# Patient Record
Sex: Male | Born: 1965 | Race: Black or African American | Hispanic: No | Marital: Single | Smoking: Never smoker
Health system: Southern US, Community
[De-identification: ages and names within clinical notes are randomized; demographics above are authoritative.]

## PROBLEM LIST (undated history)

## (undated) DIAGNOSIS — M1990 Unspecified osteoarthritis, unspecified site: Secondary | ICD-10-CM

## (undated) DIAGNOSIS — C801 Malignant (primary) neoplasm, unspecified: Secondary | ICD-10-CM

## (undated) DIAGNOSIS — G4730 Sleep apnea, unspecified: Secondary | ICD-10-CM

## (undated) DIAGNOSIS — F419 Anxiety disorder, unspecified: Secondary | ICD-10-CM

## (undated) DIAGNOSIS — E785 Hyperlipidemia, unspecified: Secondary | ICD-10-CM

## (undated) DIAGNOSIS — E119 Type 2 diabetes mellitus without complications: Secondary | ICD-10-CM

## (undated) DIAGNOSIS — G629 Polyneuropathy, unspecified: Secondary | ICD-10-CM

## (undated) DIAGNOSIS — I10 Essential (primary) hypertension: Secondary | ICD-10-CM

## (undated) HISTORY — PX: PROSTATECTOMY: SHX69

## (undated) HISTORY — PX: APPENDECTOMY: SHX54

---

## 2015-01-10 ENCOUNTER — Emergency Department
Admission: EM | Admit: 2015-01-10 | Discharge: 2015-01-10 | Disposition: A | Discharge status: Home / Self Care | Payer: Medicaid Other | Attending: Emergency Medicine | Admitting: Emergency Medicine

## 2015-01-10 ENCOUNTER — Encounter: Payer: Self-pay | Admitting: Emergency Medicine

## 2015-01-10 DIAGNOSIS — H6981 Other specified disorders of Eustachian tube, right ear: Secondary | ICD-10-CM

## 2015-01-10 DIAGNOSIS — Z7952 Long term (current) use of systemic steroids: Secondary | ICD-10-CM | POA: Diagnosis not present

## 2015-01-10 DIAGNOSIS — H6991 Unspecified Eustachian tube disorder, right ear: Secondary | ICD-10-CM | POA: Insufficient documentation

## 2015-01-10 DIAGNOSIS — H9201 Otalgia, right ear: Secondary | ICD-10-CM | POA: Diagnosis present

## 2015-01-10 DIAGNOSIS — Z7951 Long term (current) use of inhaled steroids: Secondary | ICD-10-CM | POA: Diagnosis not present

## 2015-01-10 MED ORDER — FLUTICASONE PROPIONATE 50 MCG/ACT NA SUSP: 2.0000 | Freq: Every day | NASAL | Status: DC

## 2015-01-10 MED ORDER — PREDNISONE 10 MG PO TABS: ORAL_TABLET | ORAL | Status: DC

## 2015-01-10 NOTE — ED Provider Notes (Signed)
[facility] Emergency Department Provider Note  ____________________________________________  Time seen: 0569  I have reviewed the triage vital signs and the nursing notes.   HISTORY  Chief Complaint Otalgia   HPI Christopher [name] is a 49 y.o. male please of right ear pain for almost a week. He denies any fever. Has had a little cold  symptoms but no cough. He states that hewent to the drugstore and the pharmacist told him to get some ear drops. These have not helped. He rates his pain as 8 out of 10. Hearing is decreased in the right ear.  History reviewed. No pertinent past medical history.  There are no active problems to display for this patient.   No past surgical history on file.  Current Outpatient Rx  Name  Route  Sig  Dispense  Refill  . fluticasone (FLONASE) 50 MCG/ACT nasal spray   Each Nare   Place 2 sprays into both nostrils daily.   9.9 g   0   . predniSONE (DELTASONE) 10 MG tablet      Take 3 tablet once a day for 3 days   9 tablet   0     Allergies Review of patient's allergies indicates no known allergies.  No family history on file.  Social History History  Substance Use Topics  . Smoking status: Never Smoker   . Smokeless tobacco: Not on file  . Alcohol Use: No    Review of Systems Constitutional: No fever/chills Eyes: No visual changes. ENT: No sore throat. Congestion positive Cardiovascular: Denies chest pain. Respiratory: Denies shortness of breath. Gastrointestinal: No abdominal pain.  No nausea, no vomitingMusculoskeletal: Negative for back pain. Skin: Negative for rash. Neurological: Negative for headaches, focal weakness or numbness.  10-point ROS otherwise negative.  ____________________________________________   PHYSICAL EXAM:  VITAL SIGNS: ED Triage Vitals  Enc Vitals Group     BP 01/10/15 1321 132/87 mmHg     Pulse Rate 01/10/15 1321 56     Resp 01/10/15 1321 20     Temp 01/10/15 1321 98.4  F (36.9 C)     Temp Source 01/10/15 1321 Oral     SpO2 01/10/15 1321 100 %     Weight 01/10/15 1321 243 lb (110.224 kg)     Height 01/10/15 1321 5\' 9"  (1.753 m)     Head Cir --      Peak Flow --      Pain Score 01/10/15 1322 8     Pain Loc --      Pain Edu? --      Excl. in [place]? --     Constitutional: Alert and oriented. Well appearing and in no acute distress. Eyes: Conjunctivae are normal. PERRL. EOMI.  right TM is dull with some fluid, no erythema. Head: Atraumatic. Nose: No congestion/rhinnorhea. Mouth/Throat: Mucous membranes are moist.  Oropharynx non-erythematous. Neck: No stridor.   Hematological/Lymphatic/Immunilogical: No cervical lymphadenopathy. Cardiovascular: Normal rate, regular rhythm. Grossly normal heart sounds.  Good peripheral circulation. Respiratory: Normal respiratory effort.  No retractions. Lungs CTAB. Musculoskeletal: No lower extremity tenderness nor edema.  No joint effusions.  Speech is normal. No gait instability. Skin:  Skin is warm, dry and intact. No rash noted. Psychiatric: Mood and affect are normal. Speech and behavior are normal.  ____________________________________________   LABS (all labs ordered are listed, but only abnormal results are displayed)  Labs Reviewed - No data to display  PROCEDURES  Procedure(s) performed: None  Critical Care performed: No  ____________________________________________  INITIAL IMPRESSION / ASSESSMENT AND PLAN / ED COURSE  Pertinent labs & imaging results that were available during my care of the patient were reviewed by me and considered in my medical decision making (see chart for details).  Patient was given prescription for prednisone. He is instructed to use either over-the-counter decongestant or get the prescription for Flonase field. ____________________________________________   FINAL CLINICAL IMPRESSION(S) / ED DIAGNOSES  Final diagnoses:  Eustachian tube dysfunction, right       Johnn Hai, PA-C 01/10/15 1940  Lisa Roca, MD 01/12/15 [phone]

## 2015-03-13 ENCOUNTER — Encounter: Payer: Self-pay | Admitting: Emergency Medicine

## 2015-03-13 ENCOUNTER — Emergency Department
Admission: EM | Admit: 2015-03-13 | Discharge: 2015-03-13 | Disposition: A | Discharge status: Home / Self Care | Payer: Medicaid Other | Attending: Emergency Medicine | Admitting: Emergency Medicine

## 2015-03-13 DIAGNOSIS — I10 Essential (primary) hypertension: Secondary | ICD-10-CM | POA: Diagnosis not present

## 2015-03-13 DIAGNOSIS — E119 Type 2 diabetes mellitus without complications: Secondary | ICD-10-CM | POA: Diagnosis not present

## 2015-03-13 DIAGNOSIS — Z7951 Long term (current) use of inhaled steroids: Secondary | ICD-10-CM | POA: Diagnosis not present

## 2015-03-13 DIAGNOSIS — L03115 Cellulitis of right lower limb: Secondary | ICD-10-CM

## 2015-03-13 DIAGNOSIS — R2241 Localized swelling, mass and lump, right lower limb: Secondary | ICD-10-CM | POA: Diagnosis present

## 2015-03-13 DIAGNOSIS — Z7952 Long term (current) use of systemic steroids: Secondary | ICD-10-CM | POA: Insufficient documentation

## 2015-03-13 HISTORY — DX: Essential (primary) hypertension: I10

## 2015-03-13 HISTORY — DX: Unspecified osteoarthritis, unspecified site: M19.90

## 2015-03-13 HISTORY — DX: Type 2 diabetes mellitus without complications: E11.9

## 2015-03-13 MED ORDER — SULFAMETHOXAZOLE-TRIMETHOPRIM 800-160 MG PO TABS: 1.0000 | ORAL_TABLET | Freq: Two times a day (BID) | ORAL | Status: DC

## 2015-03-13 MED ORDER — TRAMADOL HCL 50 MG PO TABS
50.0000 mg | ORAL_TABLET | Freq: Once | ORAL | Status: AC
  Administered 2015-03-13: 50 mg via ORAL
  Filled 2015-03-13: qty 1

## 2015-03-13 MED ORDER — SULFAMETHOXAZOLE-TRIMETHOPRIM 800-160 MG PO TABS
1.0000 | ORAL_TABLET | Freq: Once | ORAL | Status: AC
  Administered 2015-03-13: 1 via ORAL
  Filled 2015-03-13: qty 1

## 2015-03-13 MED ORDER — TRAMADOL HCL 50 MG PO TABS: 50.0000 mg | ORAL_TABLET | Freq: Four times a day (QID) | ORAL | Status: DC | PRN

## 2015-03-13 NOTE — ED Notes (Signed)
AAOx3.  Skin warm and dry.  NAD.  D/C home 

## 2015-03-13 NOTE — ED Provider Notes (Signed)
[facility] Emergency Department Provider Note  ____________________________________________  Time seen: Approximately 4:01 PM  I have reviewed the triage vital signs and the nursing notes.   HISTORY  Chief Complaint Abscess    HPI Christopher [name] is a 49 y.o. male patient complain abscess of right upper anterior thigh. Patient stated this didn't injection site would normally places insulin shots. Patient stated this started 3 days ago. Patient denies any fever or chills stated this been no discharge from the area. Patient is rating his pain as a tight over 10 and describes it as sharp. Patient said he has rotated his injection site.   Past Medical History  Diagnosis Date  . Diabetes mellitus without complication   . Hypertension   . Arthritis     There are no active problems to display for this patient.   No past surgical history on file.  Current Outpatient Rx  Name  Route  Sig  Dispense  Refill  . fluticasone (FLONASE) 50 MCG/ACT nasal spray   Each Nare   Place 2 sprays into both nostrils daily.   9.9 g   0   . predniSONE (DELTASONE) 10 MG tablet      Take 3 tablet once a day for 3 days   9 tablet   0   . sulfamethoxazole-trimethoprim (BACTRIM DS,SEPTRA DS) 800-160 MG per tablet   Oral   Take 1 tablet by mouth 2 (two) times daily.   20 tablet   0   . traMADol (ULTRAM) 50 MG tablet   Oral   Take 1 tablet (50 mg total) by mouth every 6 (six) hours as needed for moderate pain.   12 tablet   0     Allergies Review of patient's allergies indicates no known allergies.  History reviewed. No pertinent family history.  Social History History  Substance Use Topics  . Smoking status: Never Smoker   . Smokeless tobacco: Not on file  . Alcohol Use: No    Review of Systems Constitutional: No fever/chills Eyes: No visual changes. ENT: No sore throat. Cardiovascular: Denies chest pain. Respiratory: Denies shortness of  breath. Gastrointestinal: No abdominal pain.  No nausea, no vomiting.  No diarrhea.  No constipation. Genitourinary: Negative for dysuria. Musculoskeletal: Negative for back pain. Skin: Negative for rash. Pain-free and swelling to the anterior right thigh. Neurological: Negative for headaches, focal weakness or numbness. Endocrine:Diabetes and hypertension. 10-point ROS otherwise negative.  ____________________________________________   PHYSICAL EXAM:  VITAL SIGNS: ED Triage Vitals  Enc Vitals Group     BP 03/13/15 1524 142/78 mmHg     Pulse Rate 03/13/15 1524 80     Resp 03/13/15 1524 18     Temp 03/13/15 1524 98.2 F (36.8 C)     Temp Source 03/13/15 1524 Oral     SpO2 03/13/15 1524 97 %     Weight 03/13/15 1524 247 lb (112.038 kg)     Height 03/13/15 1524 5\' 9"  (1.753 m)     Head Cir --      Peak Flow --      Pain Score 03/13/15 1525 9     Pain Loc --      Pain Edu? --      Excl. in [place]? --     Constitutional: Alert and oriented. Well appearing and in no acute distress. Eyes: Conjunctivae are normal. PERRL. EOMI. Head: Atraumatic. Nose: No congestion/rhinnorhea. Mouth/Throat: Mucous membranes are moist.  Oropharynx non-erythematous. Neck: No stridor.  No cervical  spine tenderness to palpation. Hematological/Lymphatic/Immunilogical: No cervical lymphadenopathy. Cardiovascular: Normal rate, regular rhythm. Grossly normal heart sounds.  Good peripheral circulation. Respiratory: Normal respiratory effort.  No retractions. Lungs CTAB. Gastrointestinal: Soft and nontender. No distention. No abdominal bruits. No CVA tenderness. Musculoskeletal: No lower extremity tenderness nor edema.  No joint effusions. Neurologic:  Normal speech and language. No gross focal neurologic deficits are appreciated. No gait instability. Skin:  Skin is warm, dry and intact. No rash noted. Papular lesions on erythematous base. Lesion is nonfluctuant. Mild guarding with palpation. Psychiatric:  Mood and affect are normal. Speech and behavior are normal.  ____________________________________________   LABS (all labs ordered are listed, but only abnormal results are displayed)  Labs Reviewed - No data to display ____________________________________________  EKG   ____________________________________________  RADIOLOGY   ____________________________________________   PROCEDURES  Procedure(s) performed: None  Critical Care performed: No  ____________________________________________   INITIAL IMPRESSION / ASSESSMENT AND PLAN / ED COURSE  Pertinent labs & imaging results that were available during my care of the patient were reviewed by me and considered in my medical decision making (see chart for details).  Injection site skin infection. Area of erythema was outlined with a skin marker. Patient advised return erythema exceeds the boundaries of the skin marker. Patient placed on Bactrim DS and tramadol. Patient advised follow-up with his family doctor return by ER physician condition worsens. ____________________________________________   FINAL CLINICAL IMPRESSION(S) / ED DIAGNOSES  Final diagnoses:  Cellulitis of right lower extremity      Sable Feil, PA-C 03/13/15 1616  Earleen Newport, MD 03/13/15 [phone]

## 2015-03-13 NOTE — ED Notes (Signed)
Pt here for abscess on right upper thigh. Pt reports that it where he injects his insulin.

## 2015-06-05 ENCOUNTER — Encounter: Payer: Self-pay | Admitting: Emergency Medicine

## 2015-06-05 ENCOUNTER — Emergency Department
Admission: EM | Admit: 2015-06-05 | Discharge: 2015-06-05 | Disposition: A | Discharge status: Home / Self Care | Payer: Medicaid Other | Attending: Emergency Medicine | Admitting: Emergency Medicine

## 2015-06-05 DIAGNOSIS — R252 Cramp and spasm: Secondary | ICD-10-CM

## 2015-06-05 DIAGNOSIS — E1165 Type 2 diabetes mellitus with hyperglycemia: Secondary | ICD-10-CM | POA: Insufficient documentation

## 2015-06-05 DIAGNOSIS — M62838 Other muscle spasm: Secondary | ICD-10-CM | POA: Insufficient documentation

## 2015-06-05 DIAGNOSIS — Z7951 Long term (current) use of inhaled steroids: Secondary | ICD-10-CM | POA: Insufficient documentation

## 2015-06-05 DIAGNOSIS — Z7952 Long term (current) use of systemic steroids: Secondary | ICD-10-CM | POA: Insufficient documentation

## 2015-06-05 DIAGNOSIS — Z792 Long term (current) use of antibiotics: Secondary | ICD-10-CM | POA: Diagnosis not present

## 2015-06-05 DIAGNOSIS — I10 Essential (primary) hypertension: Secondary | ICD-10-CM | POA: Diagnosis not present

## 2015-06-05 DIAGNOSIS — R739 Hyperglycemia, unspecified: Secondary | ICD-10-CM

## 2015-06-05 HISTORY — DX: Hyperlipidemia, unspecified: E78.5

## 2015-06-05 HISTORY — DX: Anxiety disorder, unspecified: F41.9

## 2015-06-05 HISTORY — DX: Polyneuropathy, unspecified: G62.9

## 2015-06-05 LAB — COMPREHENSIVE METABOLIC PANEL
ALT: 32 U/L (ref 17–63)
AST: 32 U/L (ref 15–41)
Albumin: 4.3 g/dL (ref 3.5–5.0)
Alkaline Phosphatase: 68 U/L (ref 38–126)
Anion gap: 12 (ref 5–15)
BILIRUBIN TOTAL: 0.2 mg/dL — AB (ref 0.3–1.2)
BUN: 12 mg/dL (ref 6–20)
CALCIUM: 9.8 mg/dL (ref 8.9–10.3)
CHLORIDE: 100 mmol/L — AB (ref 101–111)
CO2: 21 mmol/L — ABNORMAL LOW (ref 22–32)
CREATININE: 1.13 mg/dL (ref 0.61–1.24)
GFR calc non Af Amer: >60 mL/min (ref >60)
Glucose, Bld: 296 mg/dL — ABNORMAL HIGH (ref 65–99)
Potassium: 4 mmol/L (ref 3.5–5.1)
Sodium: 133 mmol/L — ABNORMAL LOW (ref 135–145)
TOTAL PROTEIN: 7.8 g/dL (ref 6.5–8.1)

## 2015-06-05 LAB — CBC
HCT: 45.4 % (ref 40.0–52.0)
HEMOGLOBIN: 14.6 g/dL (ref 13.0–18.0)
MCH: 26.6 pg (ref 26.0–34.0)
MCHC: 32.1 g/dL (ref 32.0–36.0)
MCV: 83 fL (ref 80.0–100.0)
PLATELETS: 312 10*3/uL (ref 150–440)
RBC: 5.46 MIL/uL (ref 4.40–5.90)
RDW: 13.8 % (ref 11.5–14.5)
WBC: 12.7 10*3/uL — ABNORMAL HIGH (ref 3.8–10.6)

## 2015-06-05 LAB — MAGNESIUM: MAGNESIUM: 1.7 mg/dL (ref 1.7–2.4)

## 2015-06-05 MED ORDER — INSULIN ASPART 100 UNIT/ML ~~LOC~~ SOLN
10.0000 [IU] | Freq: Once | SUBCUTANEOUS | Status: AC
  Administered 2015-06-05: 10 [IU] via SUBCUTANEOUS
  Filled 2015-06-05: qty 10

## 2015-06-05 MED ORDER — DIAZEPAM 5 MG/ML IJ SOLN
2.5000 mg | Freq: Once | INTRAMUSCULAR | Status: AC
  Administered 2015-06-05: 2.5 mg via INTRAVENOUS
  Filled 2015-06-05: qty 2

## 2015-06-05 MED ORDER — SODIUM CHLORIDE 0.9 % IV BOLUS (SEPSIS)
1000.0000 mL | Freq: Once | INTRAVENOUS | Status: AC
  Administered 2015-06-05: 1000 mL via INTRAVENOUS

## 2015-06-05 MED ORDER — POTASSIUM CHLORIDE CRYS ER 20 MEQ PO TBCR
20.0000 meq | EXTENDED_RELEASE_TABLET | Freq: Once | ORAL | Status: AC
  Administered 2015-06-05: 20 meq via ORAL
  Filled 2015-06-05: qty 1

## 2015-06-05 MED ORDER — MAGNESIUM SULFATE 2 GM/50ML IV SOLN
2.0000 g | Freq: Once | INTRAVENOUS | Status: AC
  Administered 2015-06-05: 2 g via INTRAVENOUS
  Filled 2015-06-05: qty 50

## 2015-06-05 MED ORDER — DIAZEPAM 5 MG PO TABS
5.0000 mg | ORAL_TABLET | Freq: Once | ORAL | Status: AC
  Administered 2015-06-05: 5 mg via ORAL
  Filled 2015-06-05: qty 1

## 2015-06-05 MED ORDER — DIAZEPAM 2 MG PO TABS: 2.0000 mg | ORAL_TABLET | Freq: Three times a day (TID) | ORAL | Status: AC | PRN

## 2015-06-05 NOTE — ED Notes (Signed)
Pt comes into the ED via EMS c/o cramps in his hands bilaterally.  Patient had stable VS @ 135/95, 105HR, 22RR, 98% room air, and 291 CBG. Patient has history of anxiety and takes medication 2x a day where as it is prescribed Q8H.  Patient present with some anxiousness.

## 2015-06-05 NOTE — ED Provider Notes (Signed)
[facility] Emergency Department Provider Note  ____________________________________________  Time seen: 1925  I have reviewed the triage vital signs and the nursing notes.   HISTORY  Chief Complaint Hand Pain     HPI Christopher [name] is a 49 y.o. male who presents to the emergency department with spasms in both hands. He reports that he has had some muscle spasms in the past, but they have always passed fairly easily. This particular set a spasm started this afternoon and had been fairly continuous. In addition to spasms in his hand, he is feeling twinges of spasms in his feet and he reports, when he twisted in bed to reach the call bell, he felt a spasm in his right abdominal wall which has continued.   The patient is diabetic. He reports his sugars run between 160 and 250 during the day. He is on Lantus as well as a sliding scale insulin.   The patient denies any chest pain or shortness of breath.   Past Medical History  Diagnosis Date  . Diabetes mellitus without complication ([place])   . Hypertension   . Arthritis   . Anxiety   . Hyperlipidemia   . Neuropathy ([place])     There are no active problems to display for this patient.   History reviewed. No pertinent past surgical history.  Current Outpatient Rx  Name  Route  Sig  Dispense  Refill  . diazepam (VALIUM) 2 MG tablet   Oral   Take 1 tablet (2 mg total) by mouth every 8 (eight) hours as needed for anxiety.   15 tablet   0   . fluticasone (FLONASE) 50 MCG/ACT nasal spray   Each Nare   Place 2 sprays into both nostrils daily.   9.9 g   0   . predniSONE (DELTASONE) 10 MG tablet      Take 3 tablet once a day for 3 days   9 tablet   0   . sulfamethoxazole-trimethoprim (BACTRIM DS,SEPTRA DS) 800-160 MG per tablet   Oral   Take 1 tablet by mouth 2 (two) times daily.   20 tablet   0   . traMADol (ULTRAM) 50 MG tablet   Oral   Take 1 tablet (50 mg total) by mouth every 6 (six) hours  as needed for moderate pain.   12 tablet   0     Allergies Review of patient's allergies indicates no known allergies.  No family history on file.  Social History Social History  Substance Use Topics  . Smoking status: Never Smoker   . Smokeless tobacco: None  . Alcohol Use: No    Review of Systems  Constitutional: Negative for fever. ENT: Negative for sore throat. Cardiovascular: Negative for chest pain. Respiratory: Negative for cough. Gastrointestinal: Negative for abdominal pain, vomiting and diarrhea. Genitourinary: Negative for dysuria. Musculoskeletal: Muscle spasm and hands and abdominal and chest wall. See history of present illness. Skin: Negative for rash. Neurological: Negative for paresthesia or weakness   10-point ROS otherwise negative.  ____________________________________________   PHYSICAL EXAM:  VITAL SIGNS: ED Triage Vitals  Enc Vitals Group     BP 06/05/15 1831 153/97 mmHg     Pulse Rate 06/05/15 1831 95     Resp 06/05/15 1831 18     Temp 06/05/15 1831 98.5 F (36.9 C)     Temp Source 06/05/15 1831 Oral     SpO2 06/05/15 1831 99 %     Weight 06/05/15 1831 250 lb (  113.399 kg)     Height 06/05/15 1831 5\' 9"  (1.753 m)     Head Cir --      Peak Flow --      Pain Score 06/05/15 1832 8     Pain Loc --      Pain Edu? --      Excl. in [place]? --     Constitutional:  Alert and oriented. Appears uncomfortable. ENT   Head: Normocephalic and atraumatic.   Nose: No congestion/rhinnorhea.    Cardiovascular: Normal rate, regular rhythm, no murmur noted Respiratory:  Normal respiratory effort, no tachypnea.    Breath sounds are clear and equal bilaterally.  Gastrointestinal: Soft and nontender. No distention.  Back: No muscle spasm, no tenderness, no CVA tenderness. Musculoskeletal: No deformity noted. Patient appears to have carpal spasms. He also reports having a spasm in his abdominal wall which is not appreciable. Neurologic:  Normal  speech and language. No gross focal neurologic deficits are appreciated.  Skin:  Skin is warm, dry. No rash noted. Psychiatric: Mood and affect are normal. Speech and behavior are normal.  ____________________________________________    LABS (pertinent positives/negatives)  Labs Reviewed  CBC - Abnormal; Notable for the following:    WBC 12.7 (*)    All other components within normal limits  COMPREHENSIVE METABOLIC PANEL - Abnormal; Notable for the following:    Sodium 133 (*)    Chloride 100 (*)    CO2 21 (*)    Glucose, Bld 296 (*)    Total Bilirubin 0.2 (*)    All other components within normal limits  MAGNESIUM   ____________________________________________   PROCEDURES    ____________________________________________   INITIAL IMPRESSION / ASSESSMENT AND PLAN / ED COURSE  Pertinent labs & imaging results that were available during my care of the patient were reviewed by me and considered in my medical decision making (see chart for details).  Patient with carpal spasm and other muscular spasms. He reports he has a twinge of spasms in his feet as well. We will treat him with Valium, 2.5 mg IV. I'll also treat him with magnesium, 2 g IV. A magnesium level is pending, as is basic labs including a basic metabolic panel and a CBC.   We will also treat him with 1 L of normal saline.   ----------------------------------------- 8:33 PM on 06/05/2015 -----------------------------------------  The spasms are improving. Patient reports she has ongoing soreness and discomfort.  I will add potassium by mouth and a dose of Valium by mouth. His glucose is 296. Give him a dose of regular insulin. He is due for his long-acting Lantus this evening. I'll have him get that to himself when he gets home tonight.  ----------------------------------------- 9:23 PM on 06/05/2015 -----------------------------------------  Patient reports he feels better at this time. He has approximate  400 mL's of the 1 L of normal saline infused at this time. We'll let this bag of fluid finished going in and then let him go home. I've advised him to continue with his diabetic regimen and follow with primary physician.  ____________________________________________   FINAL CLINICAL IMPRESSION(S) / ED DIAGNOSES  Final diagnoses:  Spasms of the hands or feet  Hyperglycemia      Ahmed Prima, MD 06/05/15 2127

## 2015-06-05 NOTE — Discharge Instructions (Signed)
Continue your current treatment for diabetes. Drink plenty of water. Watch your diet. May take low-dose Valium if you have further spasm. You may also take magnesium, 400-800 mg, as needed or twice a day.  Follow-up with your regular doctor. Return to the emergency department if you have uncontrolled symptoms or other urgent concerns.

## 2015-07-04 ENCOUNTER — Observation Stay
Admission: EM | Admit: 2015-07-04 | Discharge: 2015-07-06 | Disposition: A | Discharge status: Home / Self Care | Payer: Medicaid Other | Attending: Internal Medicine | Admitting: Internal Medicine

## 2015-07-04 ENCOUNTER — Encounter: Payer: Self-pay | Admitting: Emergency Medicine

## 2015-07-04 DIAGNOSIS — E785 Hyperlipidemia, unspecified: Secondary | ICD-10-CM | POA: Insufficient documentation

## 2015-07-04 DIAGNOSIS — F419 Anxiety disorder, unspecified: Secondary | ICD-10-CM | POA: Diagnosis not present

## 2015-07-04 DIAGNOSIS — E1140 Type 2 diabetes mellitus with diabetic neuropathy, unspecified: Secondary | ICD-10-CM | POA: Diagnosis not present

## 2015-07-04 DIAGNOSIS — R739 Hyperglycemia, unspecified: Secondary | ICD-10-CM

## 2015-07-04 DIAGNOSIS — R252 Cramp and spasm: Secondary | ICD-10-CM | POA: Diagnosis present

## 2015-07-04 DIAGNOSIS — M1991 Primary osteoarthritis, unspecified site: Secondary | ICD-10-CM | POA: Insufficient documentation

## 2015-07-04 DIAGNOSIS — E1165 Type 2 diabetes mellitus with hyperglycemia: Secondary | ICD-10-CM | POA: Diagnosis not present

## 2015-07-04 DIAGNOSIS — M62838 Other muscle spasm: Secondary | ICD-10-CM | POA: Diagnosis present

## 2015-07-04 DIAGNOSIS — Z79891 Long term (current) use of opiate analgesic: Secondary | ICD-10-CM | POA: Diagnosis not present

## 2015-07-04 DIAGNOSIS — G894 Chronic pain syndrome: Secondary | ICD-10-CM | POA: Insufficient documentation

## 2015-07-04 DIAGNOSIS — I10 Essential (primary) hypertension: Secondary | ICD-10-CM | POA: Insufficient documentation

## 2015-07-04 DIAGNOSIS — Z7952 Long term (current) use of systemic steroids: Secondary | ICD-10-CM | POA: Insufficient documentation

## 2015-07-04 DIAGNOSIS — Z794 Long term (current) use of insulin: Secondary | ICD-10-CM | POA: Insufficient documentation

## 2015-07-04 LAB — BASIC METABOLIC PANEL
ANION GAP: 7 (ref 5–15)
BUN: 12 mg/dL (ref 6–20)
CALCIUM: 8.7 mg/dL — AB (ref 8.9–10.3)
CHLORIDE: 105 mmol/L (ref 101–111)
CO2: 21 mmol/L — AB (ref 22–32)
Creatinine, Ser: 1.03 mg/dL (ref 0.61–1.24)
GFR calc non Af Amer: >60 mL/min (ref >60)
Glucose, Bld: 527 mg/dL (ref 65–99)
Potassium: 3.7 mmol/L (ref 3.5–5.1)
SODIUM: 133 mmol/L — AB (ref 135–145)

## 2015-07-04 LAB — CBC WITH DIFFERENTIAL/PLATELET
Basophils Absolute: 0.1 10*3/uL (ref 0–0.1)
Basophils Relative: 1 %
Eosinophils Absolute: 0.1 10*3/uL (ref 0–0.7)
Eosinophils Relative: 1 %
HEMATOCRIT: 42.4 % (ref 40.0–52.0)
HEMOGLOBIN: 13.7 g/dL (ref 13.0–18.0)
Lymphocytes Relative: 21 %
Lymphs Abs: 1.9 10*3/uL (ref 1.0–3.6)
MCH: 26.7 pg (ref 26.0–34.0)
MCHC: 32.2 g/dL (ref 32.0–36.0)
MCV: 82.9 fL (ref 80.0–100.0)
Monocytes Absolute: 0.5 10*3/uL (ref 0.2–1.0)
Monocytes Relative: 6 %
NEUTROS ABS: 6.4 10*3/uL (ref 1.4–6.5)
NEUTROS PCT: 71 %
Platelets: 256 10*3/uL (ref 150–440)
RBC: 5.11 MIL/uL (ref 4.40–5.90)
RDW: 14.1 % (ref 11.5–14.5)
WBC: 9 10*3/uL (ref 3.8–10.6)

## 2015-07-04 LAB — GLUCOSE, CAPILLARY: Glucose-Capillary: 480 mg/dL — ABNORMAL HIGH (ref 65–99)

## 2015-07-04 MED ORDER — HYDROCHLOROTHIAZIDE 25 MG PO TABS
25.0000 mg | ORAL_TABLET | Freq: Every day | ORAL | Status: DC
  Administered 2015-07-05 – 2015-07-06 (×2): 25 mg via ORAL
  Filled 2015-07-04 (×2): qty 1

## 2015-07-04 MED ORDER — POLYETHYLENE GLYCOL 3350 17 G PO PACK: 17.0000 g | PACK | Freq: Every day | ORAL | Status: DC | PRN

## 2015-07-04 MED ORDER — INSULIN GLARGINE 100 UNIT/ML ~~LOC~~ SOLN
68.0000 [IU] | Freq: Once | SUBCUTANEOUS | Status: AC
Start: 2015-07-04 — End: 2015-07-05
  Administered 2015-07-05: 68 [IU] via SUBCUTANEOUS
  Filled 2015-07-04: qty 0.68

## 2015-07-04 MED ORDER — ACETAMINOPHEN 325 MG PO TABS
650.0000 mg | ORAL_TABLET | Freq: Four times a day (QID) | ORAL | Status: DC | PRN
Start: 2015-07-04 — End: 2015-07-06

## 2015-07-04 MED ORDER — MAGNESIUM SULFATE 2 GM/50ML IV SOLN
2.0000 g | Freq: Once | INTRAVENOUS | Status: AC
  Administered 2015-07-04: 2 g via INTRAVENOUS
  Filled 2015-07-04: qty 50

## 2015-07-04 MED ORDER — ONDANSETRON HCL 4 MG/2ML IJ SOLN: 4.0000 mg | Freq: Four times a day (QID) | INTRAMUSCULAR | Status: DC | PRN

## 2015-07-04 MED ORDER — POTASSIUM CHLORIDE IN NACL 20-0.9 MEQ/L-% IV SOLN
INTRAVENOUS | Status: DC
  Administered 2015-07-05 – 2015-07-06 (×4): via INTRAVENOUS
  Filled 2015-07-04 (×7): qty 1000

## 2015-07-04 MED ORDER — AMLODIPINE BESYLATE 5 MG PO TABS
5.0000 mg | ORAL_TABLET | Freq: Every day | ORAL | Status: DC
  Administered 2015-07-05 – 2015-07-06 (×2): 5 mg via ORAL
  Filled 2015-07-04 (×2): qty 1

## 2015-07-04 MED ORDER — DIAZEPAM 5 MG/ML IJ SOLN
5.0000 mg | Freq: Once | INTRAMUSCULAR | Status: AC
  Administered 2015-07-05: 5 mg via INTRAVENOUS
  Filled 2015-07-04: qty 2

## 2015-07-04 MED ORDER — LORAZEPAM 0.5 MG PO TABS
0.5000 mg | ORAL_TABLET | Freq: Two times a day (BID) | ORAL | Status: DC
  Administered 2015-07-05 – 2015-07-06 (×3): 0.5 mg via ORAL
  Filled 2015-07-04 (×3): qty 1

## 2015-07-04 MED ORDER — PREGABALIN 50 MG PO CAPS
100.0000 mg | ORAL_CAPSULE | Freq: Two times a day (BID) | ORAL | Status: DC
  Administered 2015-07-05 – 2015-07-06 (×4): 100 mg via ORAL
  Filled 2015-07-04 (×4): qty 2

## 2015-07-04 MED ORDER — LISINOPRIL 10 MG PO TABS
10.0000 mg | ORAL_TABLET | Freq: Every day | ORAL | Status: DC
  Administered 2015-07-05 – 2015-07-06 (×2): 10 mg via ORAL
  Filled 2015-07-04 (×2): qty 1

## 2015-07-04 MED ORDER — INSULIN ASPART 100 UNIT/ML ~~LOC~~ SOLN
0.0000 [IU] | Freq: Three times a day (TID) | SUBCUTANEOUS | Status: DC
  Administered 2015-07-05 (×2): 7 [IU] via SUBCUTANEOUS
  Administered 2015-07-05: 11 [IU] via SUBCUTANEOUS
  Administered 2015-07-06: 3 [IU] via SUBCUTANEOUS
  Administered 2015-07-06: 7 [IU] via SUBCUTANEOUS
  Administered 2015-07-06: 4 [IU] via SUBCUTANEOUS
  Filled 2015-07-04: qty 4
  Filled 2015-07-04: qty 7
  Filled 2015-07-04: qty 3
  Filled 2015-07-04: qty 7
  Filled 2015-07-04: qty 3
  Filled 2015-07-04: qty 7
  Filled 2015-07-04: qty 11

## 2015-07-04 MED ORDER — OXYCODONE-ACETAMINOPHEN 5-325 MG PO TABS
1.0000 | ORAL_TABLET | Freq: Four times a day (QID) | ORAL | Status: DC | PRN
Start: 2015-07-04 — End: 2015-07-06
  Administered 2015-07-05 – 2015-07-06 (×6): 1 via ORAL
  Filled 2015-07-04 (×7): qty 1

## 2015-07-04 MED ORDER — ALBUTEROL SULFATE (2.5 MG/3ML) 0.083% IN NEBU: 2.5000 mg | INHALATION_SOLUTION | RESPIRATORY_TRACT | Status: DC | PRN

## 2015-07-04 MED ORDER — SODIUM CHLORIDE 0.9 % IV BOLUS (SEPSIS)
1000.0000 mL | Freq: Once | INTRAVENOUS | Status: AC
  Administered 2015-07-04: 1000 mL via INTRAVENOUS

## 2015-07-04 MED ORDER — INSULIN GLARGINE 100 UNIT/ML ~~LOC~~ SOLN
72.0000 [IU] | Freq: Every day | SUBCUTANEOUS | Status: DC
  Administered 2015-07-05: 72 [IU] via SUBCUTANEOUS
  Filled 2015-07-04 (×2): qty 0.72

## 2015-07-04 MED ORDER — ACETAMINOPHEN 650 MG RE SUPP: 650.0000 mg | Freq: Four times a day (QID) | RECTAL | Status: DC | PRN

## 2015-07-04 MED ORDER — ENOXAPARIN SODIUM 40 MG/0.4ML ~~LOC~~ SOLN
40.0000 mg | SUBCUTANEOUS | Status: DC
  Administered 2015-07-05: 40 mg via SUBCUTANEOUS
  Filled 2015-07-04: qty 0.4

## 2015-07-04 MED ORDER — INSULIN ASPART 100 UNIT/ML ~~LOC~~ SOLN
0.0000 [IU] | Freq: Every day | SUBCUTANEOUS | Status: DC
  Administered 2015-07-05: 2 [IU] via SUBCUTANEOUS
  Administered 2015-07-05: 3 [IU] via SUBCUTANEOUS
  Filled 2015-07-04: qty 2

## 2015-07-04 MED ORDER — DIAZEPAM 5 MG PO TABS
5.0000 mg | ORAL_TABLET | Freq: Once | ORAL | Status: AC
  Administered 2015-07-04: 5 mg via ORAL
  Filled 2015-07-04: qty 1

## 2015-07-04 MED ORDER — ONDANSETRON HCL 4 MG PO TABS: 4.0000 mg | ORAL_TABLET | Freq: Four times a day (QID) | ORAL | Status: DC | PRN

## 2015-07-04 MED ORDER — BISACODYL 10 MG RE SUPP: 10.0000 mg | Freq: Every day | RECTAL | Status: DC | PRN

## 2015-07-04 MED ORDER — INSULIN ASPART 100 UNIT/ML ~~LOC~~ SOLN
10.0000 [IU] | Freq: Once | SUBCUTANEOUS | Status: AC
  Administered 2015-07-05: 10 [IU] via SUBCUTANEOUS
  Filled 2015-07-04: qty 10

## 2015-07-04 MED ORDER — DOCUSATE SODIUM 100 MG PO CAPS
100.0000 mg | ORAL_CAPSULE | Freq: Two times a day (BID) | ORAL | Status: DC
  Administered 2015-07-05 – 2015-07-06 (×4): 100 mg via ORAL
  Filled 2015-07-04 (×4): qty 1

## 2015-07-04 MED ORDER — INSULIN GLARGINE 100 UNIT/ML ~~LOC~~ SOLN
43.0000 [IU] | Freq: Every day | SUBCUTANEOUS | Status: DC
  Administered 2015-07-05 – 2015-07-06 (×2): 43 [IU] via SUBCUTANEOUS
  Filled 2015-07-04 (×3): qty 0.43

## 2015-07-04 NOTE — ED Provider Notes (Signed)
[facility] Emergency Department Provider Note  ____________________________________________  Time seen: On arrival at 2120  I have reviewed the triage vital signs and the nursing notes.  History from patient as well as for a report from paramedics provided to me.  HISTORY  Chief Complaint Hyperglycemia and Spasms     HPI Christopher [name] is a 49 y.o. male who has history of diabetes and prior issues with muscle spasm. I saw him approximately a month ago when he had bilateral spasms in his hands. This evening, he began to have spasms in both arms and shoulders. This began just after he started to eat dinner tonight. He also has some spasms in his legs, although that was not the area of greatest discomfort or concern. EMS was called, and upon their evaluation, they called me to discuss prehospital treatment. I have proved first said, 2 mg, repeat 1 as needed. The patient did receive the full 4 mg of Versed. This seemed to slow down his spasms. He arrives with some spasm activity but overall more comfortable.  The patient takes Lantus and NovoLog for his diabetes. He reports he has been compliant with this, however his blood sugar this evening was over 400. He has received proximally 500 and also normal saline from EMS. We will continue with normal saline in the emergency department currently.  The patient has a little bit of abdominal distention. He denies outright pain and he reports normal bowel function today with a bowel movement earlier. He has normal flatulence.    Past Medical History  Diagnosis Date  . Diabetes mellitus without complication ([place])   . Hypertension   . Arthritis   . Anxiety   . Hyperlipidemia   . Neuropathy ([place])     There are no active problems to display for this patient.   History reviewed. No pertinent past surgical history.  Current Outpatient Rx  Name  Route  Sig  Dispense  Refill  . diazepam (VALIUM) 2 MG tablet   Oral    Take 1 tablet (2 mg total) by mouth every 8 (eight) hours as needed for anxiety.   15 tablet   0   . fluticasone (FLONASE) 50 MCG/ACT nasal spray   Each Nare   Place 2 sprays into both nostrils daily.   9.9 g   0   . predniSONE (DELTASONE) 10 MG tablet      Take 3 tablet once a day for 3 days   9 tablet   0   . sulfamethoxazole-trimethoprim (BACTRIM DS,SEPTRA DS) 800-160 MG per tablet   Oral   Take 1 tablet by mouth 2 (two) times daily.   20 tablet   0   . traMADol (ULTRAM) 50 MG tablet   Oral   Take 1 tablet (50 mg total) by mouth every 6 (six) hours as needed for moderate pain.   12 tablet   0     Allergies Review of patient's allergies indicates no known allergies.  History reviewed. No pertinent family history.  Social History Social History  Substance Use Topics  . Smoking status: Never Smoker   . Smokeless tobacco: None  . Alcohol Use: No    Review of Systems  Constitutional: Negative for fever. ENT: Negative for congestion. Cardiovascular: Negative for chest pain. Respiratory: Negative for cough. Gastrointestinal: Negative for abdominal pain, vomiting and diarrhea. Some degree of abdominal distention. Genitourinary: Negative for dysuria. Musculoskeletal: Spasms of his upper extremities. See history of present illness. Skin: Negative for  rash. Neurological: Negative for headache or focal weakness   10-point ROS otherwise negative.  ____________________________________________   PHYSICAL EXAM:  VITAL SIGNS: ED Triage Vitals  Enc Vitals Group     BP --      Pulse --      Resp --      Temp --      Temp src --      SpO2 --      Weight --      Height --      Head Cir --      Peak Flow --      Pain Score --      Pain Loc --      Pain Edu? --      Excl. in [place]? --     Constitutional: Alert and oriented. Marland Kitchen ENT   Head: Normocephalic and atraumatic.   Nose: No congestion/rhinnorhea.       Mouth: No erythema, no swelling    Cardiovascular: Normal rate, regular rhythm, no murmur noted Respiratory:  Normal respiratory effort, no tachypnea.    Breath sounds are clear and equal bilaterally.  Gastrointestinal: Soft and nontender. No distention.  Back: No muscle spasm, no tenderness, no CVA tenderness. Musculoskeletal: No deformity noted. Patient has some minor spasm cramping noted in his hands and arms. Neurologic:  Communicative. Normal appearing spontaneous movement in all 4 extremities. No gross focal neurologic deficits are appreciated.  Skin:  Skin is warm, dry. No rash noted. Psychiatric: Mood and affect are normal. Speech and behavior are normal.  ____________________________________________    LABS (pertinent positives/negatives)  Labs Reviewed  BASIC METABOLIC PANEL - Abnormal; Notable for the following:    Sodium 133 (*)    CO2 21 (*)    Glucose, Bld 527 (*)    Calcium 8.7 (*)    All other components within normal limits  GLUCOSE, CAPILLARY - Abnormal; Notable for the following:    Glucose-Capillary 480 (*)    All other components within normal limits  CBC WITH DIFFERENTIAL/PLATELET     ____________________________________________   EKG  ED ECG REPORT I, Robbye Dede W, the attending physician, personally viewed and interpreted this ECG.   Date: 07/04/2015  EKG Time: 2121  Rate: 98  Rhythm: Normal sinus rhythm  Axis: Normal  Intervals: Normal  ST&T Change: None noted   ____________________________________________    INITIAL IMPRESSION / ASSESSMENT AND PLAN / ED COURSE  Pertinent labs & imaging results that were available during my care of the patient were reviewed by me and considered in my medical decision making (see chart for details).  49 year old male with diabetes and noted hyperglycemia C evening with muscle spasm. He has had similar symptoms in the past for which I have treated him myself. He is received 4 mg of Versed total IV prehospital. I will treat him with 5 mg  of Valium now to continue the effect. He will receive 1 L of normal saline in addition to the 500 ml's he received from EMS. We will check basic labs and reassess him.  ----------------------------------------- 11:11 PM on 07/04/2015 -----------------------------------------   Patient is continuing to have problems with cramping in his hands is painful. His sugar is notably elevated at 523. He is thirsty. I think he'll need more IV fluids. He has received magnesium.  I will treat him with his usual dose of Lantus (68 units). We will also treat him with 10 units of regular insulin. We will treat him with Valium IV at  this point.  Patient will be admitted to the hospital for ongoing fluids and treatment.   This is been discussed with Dr. Jannifer Franklin of the hospitalist service.  ____________________________________________   FINAL CLINICAL IMPRESSION(S) / ED DIAGNOSES  Final diagnoses:  Muscle spasm  Hyperglycemia      Ahmed Prima, MD 07/04/15 [phone]

## 2015-07-04 NOTE — H&P (Signed)
[place] [facility] [place] NAME: Christopher [name]    MR#:  [id number]  DATE OF BIRTH:  April 22, 1966  DATE OF ADMISSION:  07/04/2015  PRIMARY CARE PHYSICIAN: Juanell Fairly, MD   REQUESTING/REFERRING PHYSICIAN: Dr. Leontine Locket  CHIEF COMPLAINT:   Chief Complaint  Patient presents with  . Hyperglycemia  . Spasms    HISTORY OF PRESENT ILLNESS:  Christopher [name]  is a 49 y.o. male with a known history of diabetes, hypertension, chronic back pain presents to the emergency room for a second visit due to severe cramping in both his forearms and hands. Patient was seen in October 2016 for similar complaints and symptoms improved with Valium and was discharged home. He returns today with symptoms much worse. Prehospital treatment by EMS with Valium which improved symptoms mildly. Cramping is severe and patient unable to use his hands. Blood sugars have been found to be greater than 500. Patient is being admitted for hypoglycemia with severe muscle cramps under observation. His blood sugars normally run around 200. His diet and insulin dose has not changed.  PAST MEDICAL HISTORY:   Past Medical History  Diagnosis Date  . Diabetes mellitus without complication ([place])   . Hypertension   . Arthritis   . Anxiety   . Hyperlipidemia   . Neuropathy ([place])     PAST SURGICAL HISTORY:  History reviewed. No pertinent past surgical history.  SOCIAL HISTORY:   Social History  Substance Use Topics  . Smoking status: Never Smoker   . Smokeless tobacco: Not on file  . Alcohol Use: No    FAMILY HISTORY:  History reviewed. No pertinent family history.  DRUG ALLERGIES:  No Known Allergies  REVIEW OF SYSTEMS:   Review of Systems  Constitutional: Negative for fever, chills, weight loss and malaise/fatigue.  HENT: Negative for hearing loss and nosebleeds.   Eyes: Negative for blurred vision, double vision and pain.  Respiratory: Negative for cough,  hemoptysis, sputum production, shortness of breath and wheezing.   Cardiovascular: Negative for chest pain, palpitations, orthopnea and leg swelling.  Gastrointestinal: Negative for nausea, vomiting, abdominal pain, diarrhea and constipation.  Genitourinary: Negative for dysuria and hematuria.  Musculoskeletal: Positive for myalgias and back pain. Negative for falls.  Skin: Negative for rash.  Neurological: Negative for dizziness, tremors, sensory change, speech change, focal weakness, seizures and headaches.  Endo/Heme/Allergies: Does not bruise/bleed easily.  Psychiatric/Behavioral: Negative for depression and memory loss. The patient is not nervous/anxious.     MEDICATIONS AT HOME:   Prior to Admission medications   Medication Sig Start Date End Date Taking? Authorizing Provider  amLODipine (NORVASC) 5 MG tablet Take 1 tablet by mouth daily. 03/12/14  Yes Historical Provider, MD  cetirizine (ZYRTEC) 10 MG tablet Take 1 tablet by mouth daily. 07/01/14  Yes Historical Provider, MD  diazepam (VALIUM) 2 MG tablet Take 1 tablet (2 mg total) by mouth every 8 (eight) hours as needed for anxiety. 06/05/15 06/04/16 Yes Ahmed Prima, MD  DULoxetine (CYMBALTA) 30 MG capsule Take 1 capsule by mouth daily. 07/01/14  Yes Historical Provider, MD  fluticasone (FLONASE) 50 MCG/ACT nasal spray Place 2 sprays into both nostrils daily. 01/10/15 01/10/16 Yes Johnn Hai, PA-C  hydrochlorothiazide (HYDRODIURIL) 25 MG tablet Take 1 tablet by mouth daily. 01/22/14  Yes Historical Provider, MD  insulin aspart (NOVOLOG) 100 UNIT/ML FlexPen 20 Units 3 (three) times daily. 01/22/14  Yes Historical Provider, MD  insulin glargine (LANTUS) 100 UNIT/ML injection 38 units  in the morning and 68 in the evenings 07/01/14  Yes Historical Provider, MD  lisinopril (PRINIVIL,ZESTRIL) 10 MG tablet Take 1 tablet by mouth daily. 01/22/14  Yes Historical Provider, MD  LORazepam (ATIVAN) 0.5 MG tablet Take 0.5 mg by mouth daily.  07/01/14  Yes Historical Provider, MD  oxyCODONE-acetaminophen (PERCOCET/ROXICET) 5-325 MG tablet Take 1 tablet by mouth 4 (four) times daily. 07/26/14  Yes Historical Provider, MD  predniSONE (DELTASONE) 10 MG tablet Take 3 tablet once a day for 3 days 01/10/15  Yes Johnn Hai, PA-C  pregabalin (LYRICA) 100 MG capsule Take 1 capsule by mouth daily. 04/12/14  Yes Historical Provider, MD  terazosin (HYTRIN) 1 MG capsule Take 1 capsule by mouth daily. 03/10/14  Yes Historical Provider, MD  sulfamethoxazole-trimethoprim (BACTRIM DS,SEPTRA DS) 800-160 MG per tablet Take 1 tablet by mouth 2 (two) times daily. Patient not taking: Reported on 07/04/2015 03/13/15   Sable Feil, PA-C  traMADol (ULTRAM) 50 MG tablet Take 1 tablet (50 mg total) by mouth every 6 (six) hours as needed for moderate pain. Patient not taking: Reported on 07/04/2015 03/13/15   Sable Feil, PA-C      VITAL SIGNS:  Blood pressure 131/76, pulse 95, temperature 98.2 F (36.8 C), temperature source Oral, resp. rate 20, height 5\' 9"  (1.753 m), weight 112.038 kg (247 lb), SpO2 97 %.  PHYSICAL EXAMINATION:  Physical Exam  GENERAL:  49 y.o.-year-old patient lying in the bed with no acute distress.  EYES: Pupils equal, round, reactive to light and accommodation. No scleral icterus. Extraocular muscles intact.  HEENT: Head atraumatic, normocephalic. Oropharynx and nasopharynx clear. No oropharyngeal erythema, moist oral mucosa  NECK:  Supple, no jugular venous distention. No thyroid enlargement, no tenderness.  LUNGS: Normal breath sounds bilaterally, no wheezing, rales, rhonchi. No use of accessory muscles of respiration.  CARDIOVASCULAR: S1, S2 normal. No murmurs, rubs, or gallops.  ABDOMEN: Soft, nontender, nondistended. Bowel sounds present. No organomegaly or mass.  EXTREMITIES: No pedal edema, cyanosis, or clubbing. + 2 pedal & radial pulses b/l.   NEUROLOGIC: Cranial nerves II through XII are intact. No focal Motor or  sensory deficits appreciated b/l PSYCHIATRIC: The patient is alert and oriented x 3. Good affect.  SKIN: No obvious rash, lesion, or ulcer.   LABORATORY PANEL:   CBC  Recent Labs Lab 07/04/15 2142  WBC 9.0  HGB 13.7  HCT 42.4  PLT 256   ------------------------------------------------------------------------------------------------------------------  Chemistries   Recent Labs Lab 07/04/15 2142  NA 133*  K 3.7  CL 105  CO2 21*  GLUCOSE 527*  BUN 12  CREATININE 1.03  CALCIUM 8.7*   ------------------------------------------------------------------------------------------------------------------  Cardiac Enzymes No results for input(s): TROPONINI in the last 168 hours. ------------------------------------------------------------------------------------------------------------------  RADIOLOGY:  No results found.   IMPRESSION AND PLAN:   * Muscle cramps Severe to the point that patient unable to use his hands. Improved with the Valium at this point. Add Flexeril when necessary. Check ionized calcium and CK level. Could be due to neuropathy. All electrolyte abnormality.  * Uncontrolled diabetes Patient's blood sugars normally run in 200s. Hasn't date 83 and unclear why they're so elevated with no change in insulin dose or his diet. Increase his Lantus to 42 units in the morning and 72 units at night. Sliding scale insulin and diabetic diet IV fluids  * Hypertension - continue home medications  * Chronic pain syndrome Continue Percocet  * DVT prophylaxis with Lovenox  All the records are reviewed and case discussed  with ED provider. Management plans discussed with the patient, family and they are in agreement.  CODE STATUS: FULL  TOTAL TIME TAKING CARE OF THIS PATIENT: 40 minutes.    Hillary Bow R M.D on 07/04/2015 at 11:59 PM  Between 7am to 6pm - Pager - [phone]  After 6pm go to [email or web address] - password [email or web address] [facility]  [place]  [facility]  Office  [phone]  CC: Primary care physician; Juanell Fairly, MD     Note: This dictation was prepared with Dragon dictation along with smaller phrase technology. Any transcriptional errors that result from this process are unintentional.

## 2015-07-04 NOTE — ED Notes (Signed)
Pt to ED from home via EMS c/o bilateral arm cramping that started around 2010 tonight.  Pt states cramping to forearms and hands.  Per EMS pt sitting on the side of bed at home, CBG above 500, pt given 450cc normal saline and 4mg  versad.  Pt states checked blood sugar at home at 270,  pt took 30 units insulin around 2000.  Pt is A&Ox4, speaking in complete and coherent sentences and in NAD at this time.

## 2015-07-04 NOTE — ED Notes (Signed)
hospitalist in to see pt.

## 2015-07-05 LAB — GLUCOSE, CAPILLARY
GLUCOSE-CAPILLARY: 206 mg/dL — AB (ref 65–99)
GLUCOSE-CAPILLARY: 241 mg/dL — AB (ref 65–99)
GLUCOSE-CAPILLARY: 326 mg/dL — AB (ref 65–99)
Glucose-Capillary: 224 mg/dL — ABNORMAL HIGH (ref 65–99)
Glucose-Capillary: 258 mg/dL — ABNORMAL HIGH (ref 65–99)
Glucose-Capillary: 265 mg/dL — ABNORMAL HIGH (ref 65–99)

## 2015-07-05 LAB — CK: Total CK: 201 U/L (ref 49–397)

## 2015-07-05 LAB — BASIC METABOLIC PANEL
ANION GAP: 0 — AB (ref 5–15)
BUN: 11 mg/dL (ref 6–20)
CALCIUM: 8.6 mg/dL — AB (ref 8.9–10.3)
CO2: 27 mmol/L (ref 22–32)
Chloride: 109 mmol/L (ref 101–111)
Creatinine, Ser: 0.83 mg/dL (ref 0.61–1.24)
Glucose, Bld: 251 mg/dL — ABNORMAL HIGH (ref 65–99)
POTASSIUM: 3.9 mmol/L (ref 3.5–5.1)
Sodium: 136 mmol/L (ref 135–145)

## 2015-07-05 LAB — TSH: TSH: 2.904 u[IU]/mL (ref 0.350–4.500)

## 2015-07-05 MED ORDER — SODIUM CHLORIDE 0.9 % IV SOLN
2.0000 g | Freq: Once | INTRAVENOUS | Status: AC
  Administered 2015-07-05: 2 g via INTRAVENOUS
  Filled 2015-07-05: qty 20

## 2015-07-05 MED ORDER — CYCLOBENZAPRINE HCL 10 MG PO TABS
10.0000 mg | ORAL_TABLET | Freq: Three times a day (TID) | ORAL | Status: DC | PRN
  Administered 2015-07-06: 10 mg via ORAL
  Filled 2015-07-05: qty 1

## 2015-07-05 NOTE — Progress Notes (Signed)
[place] at [place] NAME: Christopher [name]    MR#:  [id number]  DATE OF BIRTH:  [birth year]  SUBJECTIVE:  CHIEF COMPLAINT:   Chief Complaint  Patient presents with  . Hyperglycemia  . Spasms   after receiving magnesium- he is better.  REVIEW OF SYSTEMS:   CONSTITUTIONAL: No fever, fatigue or weakness.  EYES: No blurred or double vision.  EARS, NOSE, AND THROAT: No tinnitus or ear pain.  RESPIRATORY: No cough, shortness of breath, wheezing or hemoptysis.  CARDIOVASCULAR: No chest pain, orthopnea, edema.  GASTROINTESTINAL: No nausea, vomiting, diarrhea or abdominal pain.  GENITOURINARY: No dysuria, hematuria.  ENDOCRINE: No polyuria, nocturia,  HEMATOLOGY: No anemia, easy bruising or bleeding SKIN: No rash or lesion. MUSCULOSKELETAL: No joint pain or arthritis.   NEUROLOGIC: No tingling, numbness, weakness.  PSYCHIATRY: No anxiety or depression.   ROS  DRUG ALLERGIES:  No Known Allergies  VITALS:  Blood pressure 126/87, pulse 51, temperature 97.4 F (36.3 C), temperature source Oral, resp. rate 18, height 5\' 9"  (1.753 m), weight 112.038 kg (247 lb), SpO2 100 %.  PHYSICAL EXAMINATION:  GENERAL:  49 y.o.-year-old patient lying in the bed with no acute distress.  EYES: Pupils equal, round, reactive to light and accommodation. No scleral icterus. Extraocular muscles intact.  HEENT: Head atraumatic, normocephalic. Oropharynx and nasopharynx clear.  NECK:  Supple, no jugular venous distention. No thyroid enlargement, no tenderness.  LUNGS: Normal breath sounds bilaterally, no wheezing, rales,rhonchi or crepitation. No use of accessory muscles of respiration.  CARDIOVASCULAR: S1, S2 normal. No murmurs, rubs, or gallops.  ABDOMEN: Soft, nontender, nondistended. Bowel sounds present. No organomegaly or mass.  EXTREMITIES: No pedal edema, cyanosis, or clubbing.  NEUROLOGIC: Cranial nerves II through XII are intact. Muscle strength  5/5 in all extremities. Sensation intact. Gait not checked.  PSYCHIATRIC: The patient is alert and oriented x 3.  SKIN: No obvious rash, lesion, or ulcer.   Physical Exam LABORATORY PANEL:   CBC  Recent Labs Lab 07/04/15 2142  WBC 9.0  HGB 13.7  HCT 42.4  PLT 256   ------------------------------------------------------------------------------------------------------------------  Chemistries   Recent Labs Lab 07/05/15 0620  NA 136  K 3.9  CL 109  CO2 27  GLUCOSE 251*  BUN 11  CREATININE 0.83  CALCIUM 8.6*   ------------------------------------------------------------------------------------------------------------------  Cardiac Enzymes No results for input(s): TROPONINI in the last 168 hours. ------------------------------------------------------------------------------------------------------------------  RADIOLOGY:  No results found.  ASSESSMENT AND PLAN:   Active Problems:   Cramps, muscle, general  * Muscle cramps Severe to the point that patient unable to use his hands. Improved with the Valium at this point. Add Flexeril when necessary.  Could be due to neuropathy. All electrolyte abnormality. Calcium and magnesium are given IV. Check TSH and PTH.  * Uncontrolled diabetes Patient's blood sugars normally run in 200s.  On admission Blood sugar was > 500. Increase his Lantus to 42 units in the morning and 72 units at night. Sliding scale insulin and diabetic diet IV fluids  * Hypertension - continue home medications  * Chronic pain syndrome Continue Percocet  * DVT prophylaxis with Lovenox   All the records are reviewed and case discussed with Care Management/Social Workerr. Management plans discussed with the patient, family and they are in agreement.  CODE STATUS: full  TOTAL TIME TAKING CARE OF THIS PATIENT: 35 minutes.   POSSIBLE D/C IN 1-2 DAYS, DEPENDING ON CLINICAL CONDITION.   Vaughan Basta M.D on 07/05/2015  Between  7am to 6pm - Pager - [phone]  After 6pm go to [email or web address] - password [email or web address] [facility]  [place] [facility]  Office  [phone]  CC: Primary care physician; Juanell Fairly, MD  Note: This dictation was prepared with Dragon dictation along with smaller phrase technology. Any transcriptional errors that result from this process are unintentional.

## 2015-07-05 NOTE — Care Management Note (Addendum)
Case Management Note  Patient Details  Name: Christopher [name] MRN: [id number] Date of Birth: 01/05/1966  Subjective/Objective:                  Met with patient to discuss discharge planning. He recently moved into a home and states that he is having a hard time paying for his Rx after paying rent. He has Medicaid which brings his out of pocket cost to $4 per Rx. He states he can pay that. I advised him to talk to his case worker at [facility] about cost of living to see if there is any funds available to help with rent. He states his glucometer is old and would like another- he states he can pay for it. RX needed for glucometer and supplies. His PCP is Dr. Tobin of [place]: [phone] ([facility]) and he agrees to follow up appointment. He states that he meets with a diabetic nurse at the clinic and they are working with him to get his sugars controlled through diet, exercise, and Rx.  He states that he drives but his son will need to provide transportation to home at discharge. He states he is independent with mobility.    Action/Plan: Follow up appointment made with Dr. Tobin on 07/21/15 at 1PM.  Fax discharge note to Dr. Tobin [phone].   Expected Discharge Date:                  Expected Discharge Plan:     In-House Referral:     Discharge planning Services  CM Consult  Post Acute Care Choice:    Choice offered to:  Patient  DME Arranged:    DME Agency:     HH Arranged:    HH Agency:     Status of Service:  Completed, signed off  Medicare Important Message Given:    Date Medicare IM Given:    Medicare IM give by:    Date Additional Medicare IM Given:    Additional Medicare Important Message give by:     If discussed at Long Length of Stay Meetings, dates discussed:    Additional Comments:  Angela Johnson, RN 07/05/2015, 11:25 AM  

## 2015-07-05 NOTE — Progress Notes (Signed)
Inpatient Diabetes Program Recommendations  AACE/ADA: New Consensus Statement on Inpatient Glycemic Control (2015)  Target Ranges:  Prepandial:   less than 140 mg/dL      Peak postprandial:   less than 180 mg/dL (1-2 hours)      Critically ill patients:  140 - 180 mg/dL  Results for JYLES, DUBORD (MRN [id number]) as of 07/05/2015 14:22  Ref. Range 07/04/2015 21:32 07/05/2015 00:22 07/05/2015 02:09 07/05/2015 07:38 07/05/2015 11:21  Glucose-Capillary Latest Ref Range: 65-99 mg/dL 480 (H) 326 (H) 206 (H) 241 (H) 224 (H)   Review of Glycemic Control  Diabetes history: DM2 Outpatient Diabetes medications: Lantus 38 units QAM, Lantus 68 units QPM, Novolog 10-30 units TID with meals Current orders for Inpatient glycemic control: Latnus 43 units QAM, Lantus 72 units QHS, Novolog 0-20 units TID with meals, Novolog 0-5 units HS  Inpatient Diabetes Program Recommendations: Insulin - Basal: Noted Latnus was increased to 43 units QAM and Lantus 72 units QHS. Agree with increase and would recommend discharging patient on increased Lantus dosages.  Note: Spoke with patient over the phone about diabetes and home regimen for diabetes control. Patient reports that he is followed by his PCP for diabetes management and currently he takes Lantus 38 units QAM, Lantus 68 units QPM, Novolog 10-30 units TID with meals as an outpatient for diabetes control. Patient states that he also sees a diabetes educator each time he goes to see his PCP. Patient reports that his Lantus was increased from 28 units QAM to 38 units QM and from 58 units QPM to 68 units QPM in September. Patient reports that his glucose fluctuates throughout the day from mid 100's-300's mg/dl and he rarely has any hypoglycemia.  Patient states that he has a glucometer but it is old and he would like a new one. Informed patient that Case Management made notation of need for prescription for a new glucometer and testing supplies. Asked patient to take the  prescription to his pharmacy and they will let him know whatglucometer is covered under his insurance. Also informed patient about the Reli-On glucometer at [facility] for $10 and box of 50 test strips for $9.  Inquired about prior A1C and patient reports his last A1C was 8% in September which is an improvement for him.  Encouraged patient to continue to work to improve A1C to goal of 7% or less.  Discussed impact of nutrition, exercise, stress, sickness, and medications on diabetes control. Patient reports that he has made several changes over the past 6 months with his diet and exercise. Discussed carbohydrates, carbohydrate goals per day and meal, along with portion sizes. Encouraged patient to read food labels and pay close attention to Total Carbohydrates in foods to make the best nutrition options. Encouraged patient to continue to check his glucose at least 3 times per day and to keep a log of glucose readings along with insulin dosages taken each time. Patient verbalized understanding of information discussed and he states that he has no further questions at this time related to diabetes.   Thanks, Barnie Alderman, RN, MSN, CDE Diabetes Coordinator Inpatient Diabetes Program [phone] (Team Pager) [phone] ([place] office) [phone] [facility] office) [phone] [facility] office)

## 2015-07-06 LAB — GLUCOSE, CAPILLARY
GLUCOSE-CAPILLARY: 201 mg/dL — AB (ref 65–99)
Glucose-Capillary: 122 mg/dL — ABNORMAL HIGH (ref 65–99)
Glucose-Capillary: 188 mg/dL — ABNORMAL HIGH (ref 65–99)

## 2015-07-06 LAB — BASIC METABOLIC PANEL
Anion gap: 5 (ref 5–15)
BUN: 9 mg/dL (ref 6–20)
CHLORIDE: 109 mmol/L (ref 101–111)
CO2: 25 mmol/L (ref 22–32)
CREATININE: 0.82 mg/dL (ref 0.61–1.24)
Calcium: 8.9 mg/dL (ref 8.9–10.3)
GFR calc non Af Amer: >60 mL/min (ref >60)
Glucose, Bld: 131 mg/dL — ABNORMAL HIGH (ref 65–99)
POTASSIUM: 3.6 mmol/L (ref 3.5–5.1)
Sodium: 139 mmol/L (ref 135–145)

## 2015-07-06 LAB — CALCIUM, IONIZED: Calcium, Ionized, Serum: 5.1 mg/dL (ref 4.5–5.6)

## 2015-07-06 LAB — PARATHYROID HORMONE, INTACT (NO CA): PTH: 67 pg/mL — ABNORMAL HIGH (ref 15–65)

## 2015-07-06 MED ORDER — CYCLOBENZAPRINE HCL 10 MG PO TABS: 10.0000 mg | ORAL_TABLET | Freq: Three times a day (TID) | ORAL | Status: DC | PRN

## 2015-07-06 NOTE — Progress Notes (Addendum)
Inpatient Diabetes Program Recommendations  AACE/ADA: New Consensus Statement on Inpatient Glycemic Control (2015)  Target Ranges:  Prepandial:   less than 140 mg/dL      Peak postprandial:   less than 180 mg/dL (1-2 hours)      Critically ill patients:  140 - 180 mg/dL   Review of Glycemic Control Diabetes history: DM2 Outpatient Diabetes medications: Lantus 38 units QAM, Lantus 68 units QPM, Novolog 10-30 units TID with meals Current orders for Inpatient glycemic control: Lantus 43 units QAM, Lantus 72 units QHS, Novolog 0-20 units TID with meals, Novolog 0-5 units HS  Inpatient Diabetes Program Recommendations: Insulin - Basal: Please consider increasing morning dose of Lantus to 46 units QAM and leave evening dose as ordered. Insulin - Meal Coverage: Please consider ordering Novolog 8 units TID with meals for meal coverage (in addition to Novolog correction scale).  Thanks, Barnie Alderman, RN, MSN, CDE Diabetes Coordinator Inpatient Diabetes Program [phone] (Team Pager from [place] to [place]) [phone] ([place] office) [phone] [facility] office) [phone] [facility] office)

## 2015-07-06 NOTE — Care Management (Signed)
MD paged for Rx for Glucometer. RN please give to patient prior to discharge today.

## 2015-07-06 NOTE — Discharge Planning (Signed)
Pt IV removed. CD papers given, explained and educated.  Pt told of suggested FU appts. And given scripts (plus one for glucometer).  [facility]SS and RN assessment revealed stability for DC to home.  Pt will be taken to front via [place] once family ride arrives.  Family will be transporting home via car.

## 2015-07-06 NOTE — Discharge Instructions (Signed)
Heart healthy and ADA diet. °Activity as tolerated. °

## 2015-07-06 NOTE — Discharge Summary (Signed)
[facility] [facility] [place] NAME: Christopher [name]    MR#:  [id number]  DATE OF BIRTH:  03-29-1966  DATE OF ADMISSION:  07/04/2015 ADMITTING PHYSICIAN: Hillary Bow, MD  DATE OF DISCHARGE: No discharge date for patient encounter.  PRIMARY CARE PHYSICIAN: Juanell Fairly, MD    ADMISSION DIAGNOSIS:  Hyperglycemia [R73.9] Muscle spasm [M62.838]   DISCHARGE DIAGNOSIS:  Hyperglycemia Muscle spasm  SECONDARY DIAGNOSIS:   Past Medical History  Diagnosis Date  . Diabetes mellitus without complication ([place])   . Hypertension   . Arthritis   . Anxiety   . Hyperlipidemia   . Neuropathy [facility])     HOSPITAL COURSE:   Muscle cramps Severe to the point that patient unable to use his hands. Improved with the Valium at this point. Added Flexeril when necessary.  Could be due to neuropathy. All electrolyte abnormality. Calcium and magnesium are given IV. Check TSH is normal. PTH 67, normal borderline.  * Uncontrolled diabetes Patient's blood sugars normally run in 200s.  On admission Blood sugar was > 500. Increase his Lantus to 42 units in the morning and 72 units at night. Sliding scale insulin and diabetic diet Blood sugar improved to about 122 - 200. Continue home Lantus and follow-up PCP to adjust dose.   * Hypertension -  controlled with home medications  * Chronic pain syndrome Continue Percocet  DISCHARGE CONDITIONS:  Stable, discharged home today.  CONSULTS OBTAINED:     DRUG ALLERGIES:  No Known Allergies  DISCHARGE MEDICATIONS:   Current Discharge Medication List    START taking these medications   Details  cyclobenzaprine (FLEXERIL) 10 MG tablet Take 1 tablet (10 mg total) by mouth 3 (three) times daily as needed for muscle spasms. Qty: 30 tablet, Refills: 0      CONTINUE these medications which have NOT CHANGED   Details  amLODipine (NORVASC) 5 MG tablet Take 1 tablet by mouth daily.    cetirizine (ZYRTEC)  10 MG tablet Take 1 tablet by mouth daily.    diazepam (VALIUM) 2 MG tablet Take 1 tablet (2 mg total) by mouth every 8 (eight) hours as needed for anxiety. Qty: 15 tablet, Refills: 0    DULoxetine (CYMBALTA) 30 MG capsule Take 1 capsule by mouth daily.    fluticasone (FLONASE) 50 MCG/ACT nasal spray Place 2 sprays into both nostrils daily. Qty: 9.9 g, Refills: 0    hydrochlorothiazide (HYDRODIURIL) 25 MG tablet Take 1 tablet by mouth daily.    insulin aspart (NOVOLOG) 100 UNIT/ML FlexPen 20 Units 3 (three) times daily.    insulin glargine (LANTUS) 100 UNIT/ML injection 38 units in the morning and 68 in the evenings    lisinopril (PRINIVIL,ZESTRIL) 10 MG tablet Take 1 tablet by mouth daily.    LORazepam (ATIVAN) 0.5 MG tablet Take 0.5 mg by mouth daily.    oxyCODONE-acetaminophen (PERCOCET/ROXICET) 5-325 MG tablet Take 1 tablet by mouth 4 (four) times daily.    predniSONE (DELTASONE) 10 MG tablet Take 3 tablet once a day for 3 days Qty: 9 tablet, Refills: 0    pregabalin (LYRICA) 100 MG capsule Take 1 capsule by mouth daily.    terazosin (HYTRIN) 1 MG capsule Take 1 capsule by mouth daily.    sulfamethoxazole-trimethoprim (BACTRIM DS,SEPTRA DS) 800-160 MG per tablet Take 1 tablet by mouth 2 (two) times daily. Qty: 20 tablet, Refills: 0    traMADol (ULTRAM) 50 MG tablet Take 1 tablet (50 mg total) by mouth every  6 (six) hours as needed for moderate pain. Qty: 12 tablet, Refills: 0         DISCHARGE INSTRUCTIONS:    If you experience worsening of your admission symptoms, develop shortness of breath, life threatening emergency, suicidal or homicidal thoughts you must seek medical attention immediately by calling 911 or calling your MD immediately  if symptoms less severe.  You Must read complete instructions/literature along with all the possible adverse reactions/side effects for all the Medicines you take and that have been prescribed to you. Take any new Medicines after  you have completely understood and accept all the possible adverse reactions/side effects.   Please note  You were cared for by a hospitalist during your hospital stay. If you have any questions about your discharge medications or the care you received while you were in the hospital after you are discharged, you can call the unit and asked to speak with the hospitalist on call if the hospitalist that took care of you is not available. Once you are discharged, your primary care physician will handle any further medical issues. Please note that NO REFILLS for any discharge medications will be authorized once you are discharged, as it is imperative that you return to your primary care physician (or establish a relationship with a primary care physician if you do not have one) for your aftercare needs so that they can reassess your need for medications and monitor your lab values.    Today   SUBJECTIVE   No complaint.Blood pressure 124/75, pulse 59, temperature 97.4 F (36.3 C), temperature source Oral, resp. rate 16, height 5\' 9"  (1.753 m), weight 111.358 kg (245 lb 8 oz), SpO2 100 %.  I/O:   Intake/Output Summary (Last 24 hours) at 07/06/15 1913 Last data filed at 07/06/15 1900  Gross per 24 hour  Intake 1829.34 ml  Output   2150 ml  Net -320.66 ml    PHYSICAL EXAMINATION:  GENERAL:  49 y.o.-year-old patient lying in the bed with no acute distress.  EYES: Pupils equal, round, reactive to light and accommodation. No scleral icterus. Extraocular muscles intact.  HEENT: Head atraumatic, normocephalic. Oropharynx and nasopharynx clear.  NECK:  Supple, no jugular venous distention. No thyroid enlargement, no tenderness.  LUNGS: Normal breath sounds bilaterally, no wheezing, rales,rhonchi or crepitation. No use of accessory muscles of respiration.  CARDIOVASCULAR: S1, S2 normal. No murmurs, rubs, or gallops.  ABDOMEN: Soft, non-tender, non-distended. Bowel sounds present. No organomegaly or  mass.  EXTREMITIES: No pedal edema, cyanosis, or clubbing.  NEUROLOGIC: Cranial nerves II through XII are intact. Muscle strength 5/5 in all extremities. Sensation intact. Gait not checked.  PSYCHIATRIC: The patient is alert and oriented x 3.  SKIN: No obvious rash, lesion, or ulcer.   DATA REVIEW:   CBC  Recent Labs Lab 07/04/15 2142  WBC 9.0  HGB 13.7  HCT 42.4  PLT 256    Chemistries   Recent Labs Lab 07/06/15 0615  NA 139  K 3.6  CL 109  CO2 25  GLUCOSE 131*  BUN 9  CREATININE 0.82  CALCIUM 8.9    Cardiac Enzymes No results for input(s): TROPONINI in the last 168 hours.  Microbiology Results  No results found for this or any previous visit.  RADIOLOGY:  No results found.      Management plans discussed with the patient, family and they are in agreement.  CODE STATUS:     Code Status Orders        Start  Ordered   07/04/15 2354  Full code   Continuous     07/04/15 2354    Advance Directive Documentation        Most Recent Value   Type of Advance Directive  Living will   Pre-existing out of facility DNR order (yellow form or pink MOST form)     "MOST" Form in Place?        TOTAL TIME TAKING CARE OF THIS PATIENT: 33 minutes.    Demetrios Loll M.D on 07/06/2015 at 7:13 PM  Between 7am to 6pm - Pager - [phone]  After 6pm go to [email or web address] - password [email or web address] [facility]  [facility]  Office  [phone]  CC: Primary care physician; Juanell Fairly, MD

## 2016-01-02 ENCOUNTER — Emergency Department
Admission: EM | Admit: 2016-01-02 | Discharge: 2016-01-02 | Disposition: A | Discharge status: Home / Self Care | Payer: Medicaid Other | Attending: Emergency Medicine | Admitting: Emergency Medicine

## 2016-01-02 ENCOUNTER — Encounter: Payer: Self-pay | Admitting: *Deleted

## 2016-01-02 DIAGNOSIS — E119 Type 2 diabetes mellitus without complications: Secondary | ICD-10-CM | POA: Insufficient documentation

## 2016-01-02 DIAGNOSIS — E785 Hyperlipidemia, unspecified: Secondary | ICD-10-CM | POA: Insufficient documentation

## 2016-01-02 DIAGNOSIS — Z794 Long term (current) use of insulin: Secondary | ICD-10-CM | POA: Diagnosis not present

## 2016-01-02 DIAGNOSIS — K0889 Other specified disorders of teeth and supporting structures: Secondary | ICD-10-CM | POA: Insufficient documentation

## 2016-01-02 DIAGNOSIS — Z79899 Other long term (current) drug therapy: Secondary | ICD-10-CM | POA: Diagnosis not present

## 2016-01-02 DIAGNOSIS — I10 Essential (primary) hypertension: Secondary | ICD-10-CM | POA: Diagnosis not present

## 2016-01-02 DIAGNOSIS — M1990 Unspecified osteoarthritis, unspecified site: Secondary | ICD-10-CM | POA: Diagnosis not present

## 2016-01-02 MED ORDER — TRAMADOL HCL 50 MG PO TABS: 50.0000 mg | ORAL_TABLET | Freq: Four times a day (QID) | ORAL | Status: DC | PRN

## 2016-01-02 MED ORDER — AMOXICILLIN 500 MG PO TABS
500.0000 mg | ORAL_TABLET | Freq: Three times a day (TID) | ORAL | Status: DC
Start: 2016-01-02 — End: 2017-02-02

## 2016-01-02 MED ORDER — IBUPROFEN 600 MG PO TABS: 600.0000 mg | ORAL_TABLET | Freq: Four times a day (QID) | ORAL | Status: DC | PRN

## 2016-01-02 MED ORDER — OXYCODONE-ACETAMINOPHEN 5-325 MG PO TABS: 1.0000 | ORAL_TABLET | Freq: Four times a day (QID) | ORAL | Status: DC | PRN

## 2016-01-02 NOTE — ED Notes (Signed)
Having pain to front teeth for the past few days   Dental appt on weds of this week

## 2016-01-02 NOTE — ED Notes (Signed)
Pt complains of dental pain across his bottom teeth is awaiting a dentist appt

## 2016-01-02 NOTE — ED Provider Notes (Signed)
[facility] Emergency Department Provider Note ____________________________________________  Time seen: Approximately 11:24 AM  I have reviewed the triage vital signs and the nursing notes.   HISTORY  Chief Complaint Dental Pain   HPI Christopher [name] is a 50 y.o. male who presents to the emergency department for evaluation of dental pain. Pain started on Friday. He has been taking Tylenol without relief. He has a dental appointment scheduled for Wednesday, but is unable to tolerate the pain.  Past Medical History  Diagnosis Date  . Diabetes mellitus without complication ([place])   . Hypertension   . Arthritis   . Anxiety   . Hyperlipidemia   . Neuropathy [facility])     Patient Active Problem List   Diagnosis Date Noted  . Cramps, muscle, general 07/04/2015    History reviewed. No pertinent past surgical history.  Current Outpatient Rx  Name  Route  Sig  Dispense  Refill  . amLODipine (NORVASC) 5 MG tablet   Oral   Take 1 tablet by mouth daily.         Marland Kitchen amoxicillin (AMOXIL) 500 MG tablet   Oral   Take 1 tablet (500 mg total) by mouth 3 (three) times daily.   30 tablet   0   . cetirizine (ZYRTEC) 10 MG tablet   Oral   Take 1 tablet by mouth daily.         . cyclobenzaprine (FLEXERIL) 10 MG tablet   Oral   Take 1 tablet (10 mg total) by mouth 3 (three) times daily as needed for muscle spasms.   30 tablet   0   . diazepam (VALIUM) 2 MG tablet   Oral   Take 1 tablet (2 mg total) by mouth every 8 (eight) hours as needed for anxiety.   15 tablet   0   . DULoxetine (CYMBALTA) 30 MG capsule   Oral   Take 1 capsule by mouth daily.         . fluticasone (FLONASE) 50 MCG/ACT nasal spray   Each Nare   Place 2 sprays into both nostrils daily.   9.9 g   0   . hydrochlorothiazide (HYDRODIURIL) 25 MG tablet   Oral   Take 1 tablet by mouth daily.         Marland Kitchen ibuprofen (ADVIL,MOTRIN) 600 MG tablet   Oral   Take 1 tablet (600 mg total) by  mouth every 6 (six) hours as needed.   30 tablet   0   . insulin aspart (NOVOLOG) 100 UNIT/ML FlexPen      20 Units 3 (three) times daily.         . insulin glargine (LANTUS) 100 UNIT/ML injection      38 units in the morning and 68 in the evenings         . lisinopril (PRINIVIL,ZESTRIL) 10 MG tablet   Oral   Take 1 tablet by mouth daily.         Marland Kitchen LORazepam (ATIVAN) 0.5 MG tablet   Oral   Take 0.5 mg by mouth daily.         Marland Kitchen oxyCODONE-acetaminophen (PERCOCET/ROXICET) 5-325 MG tablet   Oral   Take 1 tablet by mouth 4 (four) times daily.         . predniSONE (DELTASONE) 10 MG tablet      Take 3 tablet once a day for 3 days   9 tablet   0   . pregabalin (LYRICA) 100 MG capsule  Oral   Take 1 capsule by mouth daily.         Marland Kitchen sulfamethoxazole-trimethoprim (BACTRIM DS,SEPTRA DS) 800-160 MG per tablet   Oral   Take 1 tablet by mouth 2 (two) times daily. Patient not taking: Reported on 07/04/2015   20 tablet   0   . terazosin (HYTRIN) 1 MG capsule   Oral   Take 1 capsule by mouth daily.         . traMADol (ULTRAM) 50 MG tablet   Oral   Take 1 tablet (50 mg total) by mouth every 6 (six) hours as needed.   12 tablet   0     Allergies Review of patient's allergies indicates no known allergies.  No family history on file.  Social History Social History  Substance Use Topics  . Smoking status: Never Smoker   . Smokeless tobacco: None  . Alcohol Use: No    Review of Systems Constitutional: Negative for fever. Uncomfortable appearing. ENT: Positive for dental pain. Musculoskeletal: Negative for jaw pain/trismus. Skin: Negative for facial swelling ____________________________________________   PHYSICAL EXAM:  VITAL SIGNS: ED Triage Vitals  Enc Vitals Group     BP 01/02/16 1004 143/74 mmHg     Pulse Rate 01/02/16 1004 58     Resp 01/02/16 1004 20     Temp 01/02/16 1004 97.6 F (36.4 C)     Temp Source 01/02/16 1004 Oral     SpO2  01/02/16 1004 99 %     Weight 01/02/16 1004 230 lb (104.327 kg)     Height 01/02/16 1004 5\' 9"  (1.753 m)     Head Cir --      Peak Flow --      Pain Score 01/02/16 1005 9     Pain Loc --      Pain Edu? --      Excl. in [place]? --     Constitutional: Alert and oriented. Well appearing and in no acute distress. Eyes: Conjunctivae are normal.EOMI. Mouth/Throat: Mucous membranes are moist. Oropharynx non-erythematous. Periodontal Exam    Hematological/Lymphatic/Immunilogical: No cervical lymphadenopathy. Respiratory: Normal respiratory effort.  Musculoskeletal: Full ROM x 4 extremities. Neurologic:  Normal speech and language. No gross focal neurologic deficits are appreciated. Speech is normal. No gait instability. Skin:  No facial erythema or edema. Psychiatric: Mood and affect are normal. Speech and behavior are normal.  ____________________________________________   LABS (all labs ordered are listed, but only abnormal results are displayed)  Labs Reviewed - No data to display ____________________________________________   RADIOLOGY  Not indicated ____________________________________________   PROCEDURES  Procedure(s) performed: None  Critical Care performed: No  ____________________________________________   INITIAL IMPRESSION / ASSESSMENT AND PLAN / ED COURSE  Pertinent labs & imaging results that were available during my care of the patient were reviewed by me and considered in my medical decision making (see chart for details). Patient will be prescribed amoxicillin and tramadol. Patient was advised to see the dentist within 14 days. Also advised to take the antibiotic until finished. Instructed to return to the ER for symptoms that change or worsen if you are unable to schedule an appointment. ____________________________________________   FINAL CLINICAL IMPRESSION(S) / ED DIAGNOSES  Final diagnoses:  Pain, dental    Note:  This document was prepared  using Dragon voice recognition software and may include unintentional dictation errors.    Victorino Dike, FNP 01/02/16 1130  Lavonia Drafts, MD 01/02/16 [phone]

## 2016-09-28 ENCOUNTER — Encounter: Payer: Self-pay | Admitting: Emergency Medicine

## 2016-09-28 ENCOUNTER — Emergency Department
Admission: EM | Admit: 2016-09-28 | Discharge: 2016-09-28 | Disposition: A | Discharge status: Home / Self Care | Payer: Medicaid Other | Attending: Emergency Medicine | Admitting: Emergency Medicine

## 2016-09-28 DIAGNOSIS — K0889 Other specified disorders of teeth and supporting structures: Secondary | ICD-10-CM | POA: Diagnosis not present

## 2016-09-28 DIAGNOSIS — I10 Essential (primary) hypertension: Secondary | ICD-10-CM | POA: Insufficient documentation

## 2016-09-28 DIAGNOSIS — Z794 Long term (current) use of insulin: Secondary | ICD-10-CM | POA: Insufficient documentation

## 2016-09-28 DIAGNOSIS — E119 Type 2 diabetes mellitus without complications: Secondary | ICD-10-CM | POA: Diagnosis not present

## 2016-09-28 DIAGNOSIS — Z79899 Other long term (current) drug therapy: Secondary | ICD-10-CM | POA: Diagnosis not present

## 2016-09-28 MED ORDER — AMOXICILLIN 500 MG PO CAPS: 500.0000 mg | ORAL_CAPSULE | Freq: Three times a day (TID) | ORAL | 0 refills | Status: DC

## 2016-09-28 MED ORDER — OXYCODONE-ACETAMINOPHEN 5-325 MG PO TABS
1.0000 | ORAL_TABLET | Freq: Once | ORAL | Status: AC
  Administered 2016-09-28: 1 via ORAL
  Filled 2016-09-28: qty 1

## 2016-09-28 MED ORDER — IBUPROFEN 600 MG PO TABS
600.0000 mg | ORAL_TABLET | Freq: Once | ORAL | Status: AC
  Administered 2016-09-28: 600 mg via ORAL
  Filled 2016-09-28: qty 1

## 2016-09-28 MED ORDER — OXYCODONE-ACETAMINOPHEN 7.5-325 MG PO TABS: 1.0000 | ORAL_TABLET | Freq: Four times a day (QID) | ORAL | 0 refills | Status: DC | PRN

## 2016-09-28 MED ORDER — LIDOCAINE VISCOUS 2 % MT SOLN
15.0000 mL | Freq: Once | OROMUCOSAL | Status: AC
  Administered 2016-09-28: 15 mL via OROMUCOSAL
  Filled 2016-09-28: qty 15

## 2016-09-28 MED ORDER — IBUPROFEN 600 MG PO TABS: 600.0000 mg | ORAL_TABLET | Freq: Four times a day (QID) | ORAL | 0 refills | Status: DC | PRN

## 2016-09-28 NOTE — ED Provider Notes (Signed)
[facility] Emergency Department Provider Note  ____________________________________________   First MD Initiated Contact with Patient 09/28/16 1514     (approximate)  I have reviewed the triage vital signs and the nursing notes.   HISTORY  Chief Complaint Dental Pain    HPI Christopher [name] is a 51 y.o. male patient complaining of dental pain on the left side for one day. Patient state this morning wake up with gingival edema. Patient rates the pain as 8/10. Patient described a pain as "achy". No palliative measures taken for this complaint.Patient state he has a dental appointment on the 13th of this month.   Past Medical History:  Diagnosis Date  . Anxiety   . Arthritis   . Diabetes mellitus without complication ([place])   . Hyperlipidemia   . Hypertension   . Neuropathy [facility])     Patient Active Problem List   Diagnosis Date Noted  . Cramps, muscle, general 07/04/2015    History reviewed. No pertinent surgical history.  Prior to Admission medications   Medication Sig Start Date End Date Taking? Authorizing Provider  amLODipine (NORVASC) 5 MG tablet Take 1 tablet by mouth daily. 03/12/14   Historical Provider, MD  amoxicillin (AMOXIL) 500 MG capsule Take 1 capsule (500 mg total) by mouth 3 (three) times daily. 09/28/16   Sable Feil, PA-C  amoxicillin (AMOXIL) 500 MG tablet Take 1 tablet (500 mg total) by mouth 3 (three) times daily. 01/02/16   Victorino Dike, FNP  cetirizine (ZYRTEC) 10 MG tablet Take 1 tablet by mouth daily. 07/01/14   Historical Provider, MD  cyclobenzaprine (FLEXERIL) 10 MG tablet Take 1 tablet (10 mg total) by mouth 3 (three) times daily as needed for muscle spasms. 07/06/15   Demetrios Loll, MD  DULoxetine (CYMBALTA) 30 MG capsule Take 1 capsule by mouth daily. 07/01/14   Historical Provider, MD  fluticasone (FLONASE) 50 MCG/ACT nasal spray Place 2 sprays into both nostrils daily. 01/10/15 01/10/16  Johnn Hai, PA-C    hydrochlorothiazide (HYDRODIURIL) 25 MG tablet Take 1 tablet by mouth daily. 01/22/14   Historical Provider, MD  ibuprofen (ADVIL,MOTRIN) 600 MG tablet Take 1 tablet (600 mg total) by mouth every 6 (six) hours as needed. 01/02/16   Victorino Dike, FNP  ibuprofen (ADVIL,MOTRIN) 600 MG tablet Take 1 tablet (600 mg total) by mouth every 6 (six) hours as needed. 09/28/16   Sable Feil, PA-C  insulin aspart (NOVOLOG) 100 UNIT/ML FlexPen 20 Units 3 (three) times daily. 01/22/14   Historical Provider, MD  insulin glargine (LANTUS) 100 UNIT/ML injection 38 units in the morning and 68 in the evenings 07/01/14   Historical Provider, MD  lisinopril (PRINIVIL,ZESTRIL) 10 MG tablet Take 1 tablet by mouth daily. 01/22/14   Historical Provider, MD  LORazepam (ATIVAN) 0.5 MG tablet Take 0.5 mg by mouth daily. 07/01/14   Historical Provider, MD  oxyCODONE-acetaminophen (PERCOCET) 7.5-325 MG tablet Take 1 tablet by mouth every 6 (six) hours as needed for severe pain. 09/28/16   Sable Feil, PA-C  oxyCODONE-acetaminophen (ROXICET) 5-325 MG tablet Take 1 tablet by mouth every 6 (six) hours as needed. 01/02/16   Victorino Dike, FNP  predniSONE (DELTASONE) 10 MG tablet Take 3 tablet once a day for 3 days 01/10/15   Johnn Hai, PA-C  pregabalin (LYRICA) 100 MG capsule Take 1 capsule by mouth daily. 04/12/14   Historical Provider, MD  sulfamethoxazole-trimethoprim (BACTRIM DS,SEPTRA DS) 800-160 MG per tablet Take 1 tablet by mouth  2 (two) times daily. Patient not taking: Reported on 07/04/2015 03/13/15   Sable Feil, PA-C  terazosin (HYTRIN) 1 MG capsule Take 1 capsule by mouth daily. 03/10/14   Historical Provider, MD    Allergies Tramadol  No family history on file.  Social History Social History  Substance Use Topics  . Smoking status: Never Smoker  . Smokeless tobacco: Never Used  . Alcohol use No    Review of Systems Constitutional: No fever/chills Eyes: No visual changes. ENT: No sore  throat. Cardiovascular: Denies chest pain. Respiratory: Denies shortness of breath. Gastrointestinal: No abdominal pain.  No nausea, no vomiting.  No diarrhea.  No constipation. Genitourinary: Negative for dysuria. Musculoskeletal: Negative for back pain. Skin: Negative for rash. Neurological: Negative for headaches, focal weakness or numbness. Psychiatric:Anxiety Endocrine:Diabetes, hypertension, hyperlipidemia. Hematological/Lymphatic: Allergic/Immunilogical: Tramadol ____________________________________________   PHYSICAL EXAM:  VITAL SIGNS: ED Triage Vitals [09/28/16 1432]  Enc Vitals Group     BP 139/76     Pulse Rate 70     Resp 18     Temp 98.1 F (36.7 C)     Temp Source Oral     SpO2 97 %     Weight 220 lb (99.8 kg)     Height 5\' 9"  (1.753 m)     Head Circumference      Peak Flow      Pain Score 9     Pain Loc      Pain Edu?      Excl. in [place]?     Constitutional: Alert and oriented. Well appearing and in no acute distress. Eyes: Conjunctivae are normal. PERRL. EOMI. Head: Atraumatic. Nose: No congestion/rhinnorhea. Mouth/Throat: Mucous membranes are moist.  Oropharynx non-erythematous. Devitalize tooth #18 with mild gingival edema. Neck: No stridor.  No cervical spine tenderness to palpation. Hematological/Lymphatic/Immunilogical: No cervical lymphadenopathy. Cardiovascular: Normal rate, regular rhythm. Grossly normal heart sounds.  Good peripheral circulation. Respiratory: Normal respiratory effort.  No retractions. Lungs CTAB. Gastrointestinal: Soft and nontender. No distention. No abdominal bruits. No CVA tenderness. Musculoskeletal: No lower extremity tenderness nor edema.  No joint effusions. Neurologic:  Normal speech and language. No gross focal neurologic deficits are appreciated. No gait instability. Skin:  Skin is warm, dry and intact. No rash noted. Psychiatric: Mood and affect are normal. Speech and behavior are  normal.  ____________________________________________   LABS (all labs ordered are listed, but only abnormal results are displayed)  Labs Reviewed - No data to display ____________________________________________  EKG   ____________________________________________  RADIOLOGY   ____________________________________________   PROCEDURES  Procedure(s) performed: None  Procedures  Critical Care performed: No  ____________________________________________   INITIAL IMPRESSION / ASSESSMENT AND PLAN / ED COURSE  Pertinent labs & imaging results that were available during my care of the patient were reviewed by me and considered in my medical decision making (see chart for details).  Dental pain. Patient given discharge care instructions. Patient advised follow-up with scheduled dental appointment. Patient given a prescription for amoxicillin,      ____________________________________________   FINAL CLINICAL IMPRESSION(S) / ED DIAGNOSES  Final diagnoses:  Pain, dental      NEW MEDICATIONS STARTED DURING THIS VISIT:  New Prescriptions   AMOXICILLIN (AMOXIL) 500 MG CAPSULE    Take 1 capsule (500 mg total) by mouth 3 (three) times daily.   IBUPROFEN (ADVIL,MOTRIN) 600 MG TABLET    Take 1 tablet (600 mg total) by mouth every 6 (six) hours as needed.   OXYCODONE-ACETAMINOPHEN (PERCOCET) 7.5-325 MG TABLET  Take 1 tablet by mouth every 6 (six) hours as needed for severe pain.     Note:  This document was prepared using Dragon voice recognition software and may include unintentional dictation errors.    Sable Feil, PA-C 09/28/16 [place], MD 09/28/16 2109

## 2016-09-28 NOTE — ED Notes (Signed)
See triage note. Developed pain and swelling to left lower gumline for couple of days

## 2016-09-28 NOTE — ED Notes (Signed)
AAOx3.  Skin warm and dry.  NAD 

## 2016-09-28 NOTE — ED Triage Notes (Signed)
Left lower jaw dental pain x 1 day.

## 2017-01-10 ENCOUNTER — Emergency Department
Admission: EM | Admit: 2017-01-10 | Discharge: 2017-01-10 | Disposition: A | Discharge status: Home / Self Care | Payer: Medicaid Other | Attending: Student in an Organized Health Care Education/Training Program | Admitting: Student in an Organized Health Care Education/Training Program

## 2017-01-10 ENCOUNTER — Encounter: Payer: Self-pay | Admitting: Emergency Medicine

## 2017-01-10 DIAGNOSIS — I10 Essential (primary) hypertension: Secondary | ICD-10-CM | POA: Insufficient documentation

## 2017-01-10 DIAGNOSIS — Z794 Long term (current) use of insulin: Secondary | ICD-10-CM | POA: Diagnosis not present

## 2017-01-10 DIAGNOSIS — K0889 Other specified disorders of teeth and supporting structures: Secondary | ICD-10-CM | POA: Insufficient documentation

## 2017-01-10 DIAGNOSIS — E119 Type 2 diabetes mellitus without complications: Secondary | ICD-10-CM | POA: Diagnosis not present

## 2017-01-10 DIAGNOSIS — Z79899 Other long term (current) drug therapy: Secondary | ICD-10-CM | POA: Diagnosis not present

## 2017-01-10 MED ORDER — AMOXICILLIN 500 MG PO CAPS: 500.0000 mg | ORAL_CAPSULE | Freq: Three times a day (TID) | ORAL | 0 refills | Status: DC

## 2017-01-10 MED ORDER — OXYCODONE-ACETAMINOPHEN 7.5-325 MG PO TABS: 1.0000 | ORAL_TABLET | Freq: Four times a day (QID) | ORAL | 0 refills | Status: DC | PRN

## 2017-01-10 MED ORDER — IBUPROFEN 600 MG PO TABS: 600.0000 mg | ORAL_TABLET | Freq: Three times a day (TID) | ORAL | 0 refills | Status: DC | PRN

## 2017-01-10 NOTE — ED Triage Notes (Signed)
Pt c/o pain to back left lower tooth X 2 days. NAD.  Ambulatory to check in desk.

## 2017-01-10 NOTE — ED Provider Notes (Signed)
[facility] Emergency Department Provider Note   ____________________________________________   First MD Initiated Contact with Patient 01/10/17 1059     (approximate)  I have reviewed the triage vital signs and the nursing notes.   HISTORY  Chief Complaint Dental Pain    HPI Christopher [name] is a 51 y.o. male patient complaining of dental pain left lower molar area for 2 days. Patient has a history of devitalized teeth.Patient rates his pain as a 9 over 10. No palliative measures taken for this complaint. Patient denies any edema or fever at this complaint.   Past Medical History:  Diagnosis Date  . Anxiety   . Arthritis   . Diabetes mellitus without complication ([place])   . Hyperlipidemia   . Hypertension   . Neuropathy     Patient Active Problem List   Diagnosis Date Noted  . Cramps, muscle, general 07/04/2015    History reviewed. No pertinent surgical history.  Prior to Admission medications   Medication Sig Start Date End Date Taking? Authorizing Provider  amLODipine (NORVASC) 5 MG tablet Take 1 tablet by mouth daily. 03/12/14   [provider]  amoxicillin (AMOXIL) 500 MG capsule Take 1 capsule (500 mg total) by mouth 3 (three) times daily. 09/28/16   Sable Feil, PA-C  amoxicillin (AMOXIL) 500 MG capsule Take 1 capsule (500 mg total) by mouth 3 (three) times daily. 01/10/17   Sable Feil, PA-C  amoxicillin (AMOXIL) 500 MG tablet Take 1 tablet (500 mg total) by mouth 3 (three) times daily. 01/02/16   Triplett, Cari B, FNP  cetirizine (ZYRTEC) 10 MG tablet Take 1 tablet by mouth daily. 07/01/14   [provider]  cyclobenzaprine (FLEXERIL) 10 MG tablet Take 1 tablet (10 mg total) by mouth 3 (three) times daily as needed for muscle spasms. 07/06/15   Demetrios Loll, MD  DULoxetine (CYMBALTA) 30 MG capsule Take 1 capsule by mouth daily. 07/01/14   [provider]  fluticasone (FLONASE) 50 MCG/ACT nasal spray Place 2  sprays into both nostrils daily. 01/10/15 01/10/16  Johnn Hai, PA-C  hydrochlorothiazide (HYDRODIURIL) 25 MG tablet Take 1 tablet by mouth daily. 01/22/14   [provider]  ibuprofen (ADVIL,MOTRIN) 600 MG tablet Take 1 tablet (600 mg total) by mouth every 6 (six) hours as needed. 01/02/16   Triplett, Johnette Abraham B, FNP  ibuprofen (ADVIL,MOTRIN) 600 MG tablet Take 1 tablet (600 mg total) by mouth every 6 (six) hours as needed. 09/28/16   Sable Feil, PA-C  ibuprofen (ADVIL,MOTRIN) 600 MG tablet Take 1 tablet (600 mg total) by mouth every 8 (eight) hours as needed. 01/10/17   Sable Feil, PA-C  insulin aspart (NOVOLOG) 100 UNIT/ML FlexPen 20 Units 3 (three) times daily. 01/22/14   [provider]  insulin glargine (LANTUS) 100 UNIT/ML injection 38 units in the morning and 68 in the evenings 07/01/14   [provider]  lisinopril (PRINIVIL,ZESTRIL) 10 MG tablet Take 1 tablet by mouth daily. 01/22/14   [provider]  LORazepam (ATIVAN) 0.5 MG tablet Take 0.5 mg by mouth daily. 07/01/14   [provider]  oxyCODONE-acetaminophen (PERCOCET) 7.5-325 MG tablet Take 1 tablet by mouth every 6 (six) hours as needed for severe pain. 09/28/16   Sable Feil, PA-C  oxyCODONE-acetaminophen (PERCOCET) 7.5-325 MG tablet Take 1 tablet by mouth every 6 (six) hours as needed for severe pain. 01/10/17   Sable Feil, PA-C  oxyCODONE-acetaminophen (ROXICET) 5-325 MG tablet Take 1 tablet  by mouth every 6 (six) hours as needed. 01/02/16   Triplett, Johnette Abraham B, FNP  predniSONE (DELTASONE) 10 MG tablet Take 3 tablet once a day for 3 days 01/10/15   Johnn Hai, PA-C  pregabalin (LYRICA) 100 MG capsule Take 1 capsule by mouth daily. 04/12/14   [provider]  sulfamethoxazole-trimethoprim (BACTRIM DS,SEPTRA DS) 800-160 MG per tablet Take 1 tablet by mouth 2 (two) times daily. Patient not taking: Reported on 07/04/2015 03/13/15   Sable Feil, PA-C  terazosin (HYTRIN) 1  MG capsule Take 1 capsule by mouth daily. 03/10/14   [provider]    Allergies Tramadol  History reviewed. No pertinent family history.  Social History Social History  Substance Use Topics  . Smoking status: Never Smoker  . Smokeless tobacco: Never Used  . Alcohol use No    Review of Systems  Constitutional: No fever/chills . ENT: No sore throat. Dental pain Cardiovascular: Denies chest pain. Respiratory: Denies shortness of breath. Gastrointestinal: No abdominal pain.  No nausea, no vomiting.  No diarrhea.  No constipation. Genitourinary: Negative for dysuria. Musculoskeletal: Negative for back pain. Skin: Negative for rash. Neurological: Negative for headaches, focal weakness or numbness. Allergic/Immunilogical: Tramadol ____________________________________________   PHYSICAL EXAM:  VITAL SIGNS: ED Triage Vitals  Enc Vitals Group     BP 01/10/17 1019 140/83     Pulse Rate 01/10/17 1019 90     Resp 01/10/17 1019 20     Temp 01/10/17 1019 97.7 F (36.5 C)     Temp Source 01/10/17 1019 Oral     SpO2 01/10/17 1019 99 %     Weight 01/10/17 1018 220 lb (99.8 kg)     Height --      Head Circumference --      Peak Flow --      Pain Score 01/10/17 1018 9     Pain Loc --      Pain Edu? --      Excl. in [place]? --     Constitutional: Alert and oriented. Well appearing and in no acute distress. Mouth/Throat: Mucous membranes are moist.  Oropharynx non-erythematous. The devitalized tooth #18 and 19 with mild gingival edema.  Hematological/Lymphatic/Immunilogical: No cervical lymphadenopathy. Cardiovascular: Normal rate, regular rhythm. Grossly normal heart sounds.  Good peripheral circulation. Respiratory: Normal respiratory effort.  No retractions. Lungs CTAB. Neurologic:  Normal speech and language. No gross focal neurologic deficits are appreciated. No gait instability. Skin:  Skin is warm, dry and intact. No rash noted. Psychiatric: Mood and affect are  normal. Speech and behavior are normal.  ____________________________________________   LABS (all labs ordered are listed, but only abnormal results are displayed)  Labs Reviewed - No data to display ____________________________________________  EKG   ____________________________________________  RADIOLOGY   ____________________________________________   PROCEDURES  Procedure(s) performed: None  Procedures  Critical Care performed: No  ____________________________________________   INITIAL IMPRESSION / ASSESSMENT AND PLAN / ED COURSE  Pertinent labs & imaging results that were available during my care of the patient were reviewed by me and considered in my medical decision making (see chart for details).  Dental pain secondary to caries. Patient given discharge Instructions. Patient advised follow-up with walk-in dental clinic.      ____________________________________________   FINAL CLINICAL IMPRESSION(S) / ED DIAGNOSES  Final diagnoses:  Pain, dental      NEW MEDICATIONS STARTED DURING THIS VISIT:  New Prescriptions   AMOXICILLIN (AMOXIL) 500 MG CAPSULE    Take 1 capsule (500 mg  total) by mouth 3 (three) times daily.   IBUPROFEN (ADVIL,MOTRIN) 600 MG TABLET    Take 1 tablet (600 mg total) by mouth every 8 (eight) hours as needed.   OXYCODONE-ACETAMINOPHEN (PERCOCET) 7.5-325 MG TABLET    Take 1 tablet by mouth every 6 (six) hours as needed for severe pain.     Note:  This document was prepared using Dragon voice recognition software and may include unintentional dictation errors.    Sable Feil, PA-C 01/10/17 1111    Merlyn Lot, MD 01/11/17 0430

## 2017-01-10 NOTE — ED Notes (Signed)
Patient reports pain to left lower teeth. Reports "funny taste" and possible drainage. No swelling to jaw noted. Denies difficulty swallowing.

## 2017-02-01 ENCOUNTER — Observation Stay (HOSPITAL_BASED_OUTPATIENT_CLINIC_OR_DEPARTMENT_OTHER)
Admit: 2017-02-01 | Discharge: 2017-02-01 | Disposition: A | Discharge status: Home / Self Care | Payer: Medicaid Other | Attending: Internal Medicine | Admitting: Internal Medicine

## 2017-02-01 ENCOUNTER — Observation Stay
Admission: EM | Admit: 2017-02-01 | Discharge: 2017-02-02 | Disposition: A | Discharge status: Home / Self Care | Payer: Medicaid Other | Attending: Internal Medicine | Admitting: Internal Medicine

## 2017-02-01 ENCOUNTER — Emergency Department: Payer: Medicaid Other

## 2017-02-01 ENCOUNTER — Observation Stay: Payer: Medicaid Other

## 2017-02-01 ENCOUNTER — Encounter: Payer: Self-pay | Admitting: Emergency Medicine

## 2017-02-01 DIAGNOSIS — F419 Anxiety disorder, unspecified: Secondary | ICD-10-CM | POA: Diagnosis not present

## 2017-02-01 DIAGNOSIS — N400 Enlarged prostate without lower urinary tract symptoms: Secondary | ICD-10-CM | POA: Diagnosis not present

## 2017-02-01 DIAGNOSIS — G459 Transient cerebral ischemic attack, unspecified: Principal | ICD-10-CM | POA: Diagnosis present

## 2017-02-01 DIAGNOSIS — E1165 Type 2 diabetes mellitus with hyperglycemia: Secondary | ICD-10-CM | POA: Diagnosis not present

## 2017-02-01 DIAGNOSIS — R531 Weakness: Secondary | ICD-10-CM | POA: Diagnosis not present

## 2017-02-01 DIAGNOSIS — G4730 Sleep apnea, unspecified: Secondary | ICD-10-CM | POA: Diagnosis not present

## 2017-02-01 DIAGNOSIS — Z794 Long term (current) use of insulin: Secondary | ICD-10-CM | POA: Insufficient documentation

## 2017-02-01 DIAGNOSIS — Z8546 Personal history of malignant neoplasm of prostate: Secondary | ICD-10-CM | POA: Insufficient documentation

## 2017-02-01 DIAGNOSIS — Z79891 Long term (current) use of opiate analgesic: Secondary | ICD-10-CM | POA: Diagnosis not present

## 2017-02-01 DIAGNOSIS — R200 Anesthesia of skin: Secondary | ICD-10-CM

## 2017-02-01 DIAGNOSIS — Z7951 Long term (current) use of inhaled steroids: Secondary | ICD-10-CM | POA: Insufficient documentation

## 2017-02-01 DIAGNOSIS — Z79899 Other long term (current) drug therapy: Secondary | ICD-10-CM | POA: Diagnosis not present

## 2017-02-01 DIAGNOSIS — I10 Essential (primary) hypertension: Secondary | ICD-10-CM | POA: Diagnosis not present

## 2017-02-01 DIAGNOSIS — Z7982 Long term (current) use of aspirin: Secondary | ICD-10-CM | POA: Diagnosis not present

## 2017-02-01 DIAGNOSIS — E785 Hyperlipidemia, unspecified: Secondary | ICD-10-CM | POA: Insufficient documentation

## 2017-02-01 DIAGNOSIS — R739 Hyperglycemia, unspecified: Secondary | ICD-10-CM

## 2017-02-01 DIAGNOSIS — R0789 Other chest pain: Secondary | ICD-10-CM | POA: Diagnosis not present

## 2017-02-01 DIAGNOSIS — E1140 Type 2 diabetes mellitus with diabetic neuropathy, unspecified: Secondary | ICD-10-CM | POA: Insufficient documentation

## 2017-02-01 DIAGNOSIS — I639 Cerebral infarction, unspecified: Secondary | ICD-10-CM

## 2017-02-01 DIAGNOSIS — F1410 Cocaine abuse, uncomplicated: Secondary | ICD-10-CM | POA: Insufficient documentation

## 2017-02-01 HISTORY — DX: Sleep apnea, unspecified: G47.30

## 2017-02-01 HISTORY — DX: Malignant (primary) neoplasm, unspecified: C80.1

## 2017-02-01 LAB — PROTIME-INR
INR: 0.94
Prothrombin Time: 12.6 seconds (ref 11.4–15.2)

## 2017-02-01 LAB — URINE DRUG SCREEN, QUALITATIVE (ARMC ONLY)
Amphetamines, Ur Screen: NOT DETECTED
BARBITURATES, UR SCREEN: NOT DETECTED
Benzodiazepine, Ur Scrn: NOT DETECTED
COCAINE METABOLITE, UR ~~LOC~~: POSITIVE — AB
Cannabinoid 50 Ng, Ur ~~LOC~~: POSITIVE — AB
MDMA (Ecstasy)Ur Screen: NOT DETECTED
METHADONE SCREEN, URINE: NOT DETECTED
OPIATE, UR SCREEN: NOT DETECTED
PHENCYCLIDINE (PCP) UR S: NOT DETECTED
Tricyclic, Ur Screen: NOT DETECTED

## 2017-02-01 LAB — COMPREHENSIVE METABOLIC PANEL
ALK PHOS: 46 U/L (ref 38–126)
ALT: 15 U/L — AB (ref 17–63)
ANION GAP: 13 (ref 5–15)
AST: 29 U/L (ref 15–41)
Albumin: 3.6 g/dL (ref 3.5–5.0)
BILIRUBIN TOTAL: 1.3 mg/dL — AB (ref 0.3–1.2)
BUN: 10 mg/dL (ref 6–20)
CALCIUM: 9.6 mg/dL (ref 8.9–10.3)
CO2: 17 mmol/L — AB (ref 22–32)
CREATININE: 1.11 mg/dL (ref 0.61–1.24)
Chloride: 97 mmol/L — ABNORMAL LOW (ref 101–111)
Glucose, Bld: 531 mg/dL (ref 65–99)
Potassium: 3.9 mmol/L (ref 3.5–5.1)
Sodium: 127 mmol/L — ABNORMAL LOW (ref 135–145)
TOTAL PROTEIN: 6.5 g/dL (ref 6.5–8.1)

## 2017-02-01 LAB — CBC
HEMATOCRIT: 43.9 % (ref 40.0–52.0)
Hemoglobin: 14.9 g/dL (ref 13.0–18.0)
MCH: 27.6 pg (ref 26.0–34.0)
MCHC: 33.9 g/dL (ref 32.0–36.0)
MCV: 81.4 fL (ref 80.0–100.0)
Platelets: 301 10*3/uL (ref 150–440)
RBC: 5.4 MIL/uL (ref 4.40–5.90)
RDW: 13.2 % (ref 11.5–14.5)
WBC: 9.5 10*3/uL (ref 3.8–10.6)

## 2017-02-01 LAB — GLUCOSE, CAPILLARY
GLUCOSE-CAPILLARY: 501 mg/dL — AB (ref 65–99)
Glucose-Capillary: 352 mg/dL — ABNORMAL HIGH (ref 65–99)
Glucose-Capillary: 152 mg/dL — ABNORMAL HIGH (ref 65–99)
Glucose-Capillary: 101 mg/dL — ABNORMAL HIGH (ref 65–99)
Glucose-Capillary: 114 mg/dL — ABNORMAL HIGH (ref 65–99)
Glucose-Capillary: 264 mg/dL — ABNORMAL HIGH (ref 65–99)
Glucose-Capillary: 301 mg/dL — ABNORMAL HIGH (ref 65–99)
Glucose-Capillary: 217 mg/dL — ABNORMAL HIGH (ref 65–99)

## 2017-02-01 LAB — LIPID PANEL
Cholesterol: 227 mg/dL — ABNORMAL HIGH (ref 0–200)
HDL: 36 mg/dL — ABNORMAL LOW (ref >40)
LDL Cholesterol: 145 mg/dL — ABNORMAL HIGH (ref 0–99)
Total CHOL/HDL Ratio: 6.3 RATIO
Triglycerides: 231 mg/dL — ABNORMAL HIGH (ref <150)
VLDL: 46 mg/dL — ABNORMAL HIGH (ref 0–40)

## 2017-02-01 LAB — ECHOCARDIOGRAM COMPLETE
Height: 69 in
Weight: 2995.2 oz

## 2017-02-01 LAB — APTT: aPTT: <24 seconds — ABNORMAL LOW (ref 24–36)

## 2017-02-01 LAB — TROPONIN I: TROPONIN I: 0.03 ng/mL — AB (ref <0.03)

## 2017-02-01 MED ORDER — LORAZEPAM 0.5 MG PO TABS
0.5000 mg | ORAL_TABLET | Freq: Every day | ORAL | Status: DC
  Administered 2017-02-01 – 2017-02-02 (×2): 0.5 mg via ORAL
  Filled 2017-02-01 (×2): qty 1

## 2017-02-01 MED ORDER — INSULIN GLARGINE 100 UNIT/ML ~~LOC~~ SOLN
80.0000 [IU] | Freq: Every day | SUBCUTANEOUS | Status: DC
  Filled 2017-02-01: qty 0.8

## 2017-02-01 MED ORDER — LORATADINE 10 MG PO TABS
10.0000 mg | ORAL_TABLET | Freq: Every day | ORAL | Status: DC
  Administered 2017-02-01 – 2017-02-02 (×2): 10 mg via ORAL
  Filled 2017-02-01 (×2): qty 1

## 2017-02-01 MED ORDER — ASPIRIN EC 81 MG PO TBEC
81.0000 mg | DELAYED_RELEASE_TABLET | Freq: Every day | ORAL | Status: DC
  Administered 2017-02-01 – 2017-02-02 (×2): 81 mg via ORAL
  Filled 2017-02-01 (×2): qty 1

## 2017-02-01 MED ORDER — PREGABALIN 50 MG PO CAPS
100.0000 mg | ORAL_CAPSULE | Freq: Two times a day (BID) | ORAL | Status: DC
  Administered 2017-02-01 – 2017-02-02 (×3): 100 mg via ORAL
  Filled 2017-02-01 (×3): qty 2

## 2017-02-01 MED ORDER — HYDROCHLOROTHIAZIDE 25 MG PO TABS
25.0000 mg | ORAL_TABLET | Freq: Every day | ORAL | Status: DC
Start: 2017-02-01 — End: 2017-02-02
  Administered 2017-02-01 – 2017-02-02 (×2): 25 mg via ORAL
  Filled 2017-02-01 (×2): qty 1

## 2017-02-01 MED ORDER — INSULIN ASPART 100 UNIT/ML ~~LOC~~ SOLN
10.0000 [IU] | Freq: Once | SUBCUTANEOUS | Status: AC
  Administered 2017-02-01: 10 [IU] via INTRAVENOUS
  Filled 2017-02-01: qty 10

## 2017-02-01 MED ORDER — INSULIN GLARGINE 100 UNIT/ML ~~LOC~~ SOLN
60.0000 [IU] | Freq: Two times a day (BID) | SUBCUTANEOUS | Status: DC
  Administered 2017-02-01 – 2017-02-02 (×3): 60 [IU] via SUBCUTANEOUS
  Filled 2017-02-01 (×4): qty 0.6

## 2017-02-01 MED ORDER — ACETAMINOPHEN 650 MG RE SUPP: 650.0000 mg | RECTAL | Status: DC | PRN

## 2017-02-01 MED ORDER — AMLODIPINE BESYLATE 5 MG PO TABS
5.0000 mg | ORAL_TABLET | Freq: Every day | ORAL | Status: DC
  Administered 2017-02-01 – 2017-02-02 (×2): 5 mg via ORAL
  Filled 2017-02-01 (×2): qty 1

## 2017-02-01 MED ORDER — ATORVASTATIN CALCIUM 20 MG PO TABS
20.0000 mg | ORAL_TABLET | Freq: Every day | ORAL | Status: DC
  Administered 2017-02-01: 20 mg via ORAL
  Filled 2017-02-01: qty 1

## 2017-02-01 MED ORDER — STROKE: EARLY STAGES OF RECOVERY BOOK
Freq: Once | Status: AC
  Administered 2017-02-01: 07:00:00

## 2017-02-01 MED ORDER — FLUTICASONE PROPIONATE 50 MCG/ACT NA SUSP
2.0000 | Freq: Every day | NASAL | Status: DC
  Administered 2017-02-01 – 2017-02-02 (×2): 2 via NASAL
  Filled 2017-02-01: qty 16

## 2017-02-01 MED ORDER — LISINOPRIL 10 MG PO TABS
10.0000 mg | ORAL_TABLET | Freq: Every day | ORAL | Status: DC
  Administered 2017-02-01 – 2017-02-02 (×2): 10 mg via ORAL
  Filled 2017-02-01 (×2): qty 1

## 2017-02-01 MED ORDER — SODIUM CHLORIDE 0.9 % IV SOLN
INTRAVENOUS | Status: DC
  Administered 2017-02-01 – 2017-02-02 (×5): via INTRAVENOUS

## 2017-02-01 MED ORDER — PNEUMOCOCCAL VAC POLYVALENT 25 MCG/0.5ML IJ INJ: 0.5000 mL | INJECTION | INTRAMUSCULAR | Status: DC

## 2017-02-01 MED ORDER — DULOXETINE HCL 30 MG PO CPEP
30.0000 mg | ORAL_CAPSULE | Freq: Every day | ORAL | Status: DC
  Administered 2017-02-01 – 2017-02-02 (×2): 30 mg via ORAL
  Filled 2017-02-01 (×2): qty 1

## 2017-02-01 MED ORDER — ENOXAPARIN SODIUM 40 MG/0.4ML ~~LOC~~ SOLN
40.0000 mg | SUBCUTANEOUS | Status: DC
  Administered 2017-02-01: 40 mg via SUBCUTANEOUS
  Filled 2017-02-01: qty 0.4

## 2017-02-01 MED ORDER — SODIUM CHLORIDE 0.9 % IV BOLUS (SEPSIS)
1000.0000 mL | Freq: Once | INTRAVENOUS | Status: AC
  Administered 2017-02-01: 1000 mL via INTRAVENOUS

## 2017-02-01 MED ORDER — INSULIN ASPART 100 UNIT/ML ~~LOC~~ SOLN
0.0000 [IU] | Freq: Three times a day (TID) | SUBCUTANEOUS | Status: DC
  Administered 2017-02-01: 17:00:00 15 [IU] via SUBCUTANEOUS
  Administered 2017-02-01: 12:00:00 11 [IU] via SUBCUTANEOUS
  Administered 2017-02-02: 08:00:00 7 [IU] via SUBCUTANEOUS
  Filled 2017-02-01: qty 7
  Filled 2017-02-01: qty 11
  Filled 2017-02-01: qty 15

## 2017-02-01 MED ORDER — CYCLOBENZAPRINE HCL 10 MG PO TABS: 10.0000 mg | ORAL_TABLET | Freq: Three times a day (TID) | ORAL | Status: DC | PRN

## 2017-02-01 MED ORDER — ACETAMINOPHEN 325 MG PO TABS
650.0000 mg | ORAL_TABLET | ORAL | Status: DC | PRN
  Filled 2017-02-01: qty 2

## 2017-02-01 MED ORDER — ASPIRIN 81 MG PO CHEW
324.0000 mg | CHEWABLE_TABLET | Freq: Once | ORAL | Status: AC
  Administered 2017-02-01: 324 mg via ORAL

## 2017-02-01 MED ORDER — NYSTATIN 100000 UNIT/GM EX CREA
TOPICAL_CREAM | Freq: Two times a day (BID) | CUTANEOUS | Status: DC
  Administered 2017-02-01 – 2017-02-02 (×3): via TOPICAL
  Filled 2017-02-01: qty 15

## 2017-02-01 MED ORDER — ASPIRIN 300 MG RE SUPP: 300.0000 mg | Freq: Once | RECTAL | Status: DC

## 2017-02-01 MED ORDER — TERAZOSIN HCL 1 MG PO CAPS
1.0000 mg | ORAL_CAPSULE | Freq: Every day | ORAL | Status: DC
  Administered 2017-02-01 – 2017-02-02 (×2): 1 mg via ORAL
  Filled 2017-02-01 (×2): qty 1

## 2017-02-01 MED ORDER — ASPIRIN 81 MG PO CHEW
CHEWABLE_TABLET | ORAL | Status: AC
  Administered 2017-02-01: 324 mg via ORAL
  Filled 2017-02-01: qty 4

## 2017-02-01 MED ORDER — OXYCODONE-ACETAMINOPHEN 5-325 MG PO TABS
1.0000 | ORAL_TABLET | Freq: Four times a day (QID) | ORAL | Status: DC | PRN
  Administered 2017-02-01 – 2017-02-02 (×6): 1 via ORAL
  Filled 2017-02-01 (×7): qty 1

## 2017-02-01 MED ORDER — ACETAMINOPHEN 160 MG/5ML PO SOLN: 650.0000 mg | ORAL | Status: DC | PRN

## 2017-02-01 MED ORDER — SENNOSIDES-DOCUSATE SODIUM 8.6-50 MG PO TABS
1.0000 | ORAL_TABLET | Freq: Every day | ORAL | Status: DC
  Administered 2017-02-01: 1 via ORAL
  Filled 2017-02-01: qty 1

## 2017-02-01 NOTE — ED Notes (Signed)
Nicholes Calamity, MD on [facility] at this time.

## 2017-02-01 NOTE — ED Notes (Signed)
Patient transported to CT 

## 2017-02-01 NOTE — ED Notes (Signed)
Pt c/o R/shoulder pain. RN, [place] [facility] has been notified.

## 2017-02-01 NOTE — H&P (Addendum)
Christopher [name] is an 51 y.o. male.   Chief Complaint: Back pain HPI: The patient with past medical history of diabetes with neuropathy presents to the emergency complaining of back pain. The patient states that he was in his usual state of health this evening after he finished supper when his back began to hurt under his right shoulder blade. The pain radiated into his armpit and right flank and at times into his right chest. The pain seemed to be colicky. It was dull in character. The patient also noted at that time that his right arm and right lower extremity also began to feel weak. He came to the emergency department for evaluation where he was found to be very hyperglycemic with a blood sugar of 570. He was given 10 units of insulin IV. CT of the head was negative for acute process. Notably, the patient's urine tox screen was positive for cocaine. Telemetry was not grossly abnormal. Nonetheless neurology telemedicine recommended rule out of stroke which prompted the emergency department staff to call the hospitalist service for admission.  Past Medical History:  Diagnosis Date  . Anxiety   . Arthritis   . Cancer [facility])    Prostate  . Diabetes mellitus without complication ([place])   . Hyperlipidemia   . Hypertension   . Neuropathy   . Neuropathy   . Sleep apnea     Past Surgical History:  Procedure Laterality Date  . APPENDECTOMY      Family History  Problem Relation Age of Onset  . Cancer Father   . Stroke Father    Social History:  reports that he has never smoked. He has never used smokeless tobacco. He reports that he does not drink alcohol or use drugs.  Allergies:  Allergies  Allergen Reactions  . Tramadol Nausea And Vomiting    Medications Prior to Admission  Medication Sig Dispense Refill  . amLODipine (NORVASC) 5 MG tablet Take 1 tablet by mouth daily.    Marland Kitchen aspirin EC 81 MG tablet Take 81 mg by mouth daily.    Marland Kitchen atorvastatin (LIPITOR) 20 MG tablet Take 20 mg by mouth  daily at 6 PM.    . cetirizine (ZYRTEC) 10 MG tablet Take 1 tablet by mouth daily.    . cyclobenzaprine (FLEXERIL) 10 MG tablet Take 1 tablet (10 mg total) by mouth 3 (three) times daily as needed for muscle spasms. 30 tablet 0  . DULoxetine (CYMBALTA) 30 MG capsule Take 1 capsule by mouth daily.    . hydrochlorothiazide (HYDRODIURIL) 25 MG tablet Take 1 tablet by mouth daily.    Marland Kitchen ibuprofen (ADVIL,MOTRIN) 600 MG tablet Take 1 tablet (600 mg total) by mouth every 6 (six) hours as needed. 30 tablet 0  . insulin aspart (NOVOLOG) 100 UNIT/ML FlexPen Inject 5-20 Units into the skin 3 (three) times daily with meals. Sliding scale    . insulin glargine (LANTUS) 100 UNIT/ML injection Inject 60 Units into the skin 2 (two) times daily.     Marland Kitchen lisinopril (PRINIVIL,ZESTRIL) 10 MG tablet Take 1 tablet by mouth daily.    Marland Kitchen LORazepam (ATIVAN) 0.5 MG tablet Take 0.5 mg by mouth daily.    Marland Kitchen oxyCODONE-acetaminophen (ROXICET) 5-325 MG tablet Take 1 tablet by mouth every 6 (six) hours as needed. 9 tablet 0  . pregabalin (LYRICA) 100 MG capsule Take 1 capsule by mouth 2 (two) times daily.     Marland Kitchen terazosin (HYTRIN) 1 MG capsule Take 1 capsule by mouth daily.    Marland Kitchen  amoxicillin (AMOXIL) 500 MG capsule Take 1 capsule (500 mg total) by mouth 3 (three) times daily. (Patient not taking: Reported on 02/01/2017) 30 capsule 0  . amoxicillin (AMOXIL) 500 MG capsule Take 1 capsule (500 mg total) by mouth 3 (three) times daily. (Patient not taking: Reported on 02/01/2017) 30 capsule 0  . amoxicillin (AMOXIL) 500 MG tablet Take 1 tablet (500 mg total) by mouth 3 (three) times daily. (Patient not taking: Reported on 02/01/2017) 30 tablet 0  . fluticasone (FLONASE) 50 MCG/ACT nasal spray Place 2 sprays into both nostrils daily. 9.9 g 0  . sulfamethoxazole-trimethoprim (BACTRIM DS,SEPTRA DS) 800-160 MG per tablet Take 1 tablet by mouth 2 (two) times daily. (Patient not taking: Reported on 07/04/2015) 20 tablet 0    Results for orders  placed or performed during the hospital encounter of 02/01/17 (from the past 48 hour(s))  Glucose, capillary     Status: Abnormal   Collection Time: 02/01/17 12:16 AM  Result Value Ref Range   Glucose-Capillary 501 (HH) 65 - 99 mg/dL  CBC     Status: None   Collection Time: 02/01/17 12:16 AM  Result Value Ref Range   WBC 9.5 3.8 - 10.6 K/uL   RBC 5.40 4.40 - 5.90 MIL/uL   Hemoglobin 14.9 13.0 - 18.0 g/dL   HCT 43.9 40.0 - 52.0 %   MCV 81.4 80.0 - 100.0 fL   MCH 27.6 26.0 - 34.0 pg   MCHC 33.9 32.0 - 36.0 g/dL   RDW 13.2 11.5 - 14.5 %   Platelets 301 150 - 440 K/uL  Comprehensive metabolic panel     Status: Abnormal   Collection Time: 02/01/17 12:16 AM  Result Value Ref Range   Sodium 127 (L) 135 - 145 mmol/L   Potassium 3.9 3.5 - 5.1 mmol/L    Comment: HEMOLYSIS AT THIS LEVEL MAY AFFECT RESULT   Chloride 97 (L) 101 - 111 mmol/L   CO2 17 (L) 22 - 32 mmol/L   Glucose, Bld 531 (HH) 65 - 99 mg/dL    Comment: CRITICAL RESULT CALLED TO, READ BACK BY AND VERIFIED WITH: KASEY ROBERTS @ 0112 ON 02/01/2017 BY CAF    BUN 10 6 - 20 mg/dL   Creatinine, Ser 1.11 0.61 - 1.24 mg/dL   Calcium 9.6 8.9 - 10.3 mg/dL   Total Protein 6.5 6.5 - 8.1 g/dL   Albumin 3.6 3.5 - 5.0 g/dL   AST 29 15 - 41 U/L   ALT 15 (L) 17 - 63 U/L   Alkaline Phosphatase 46 38 - 126 U/L   Total Bilirubin 1.3 (H) 0.3 - 1.2 mg/dL   GFR calc non Af Amer >60 >60 mL/min   GFR calc Af Amer >60 >60 mL/min    Comment: (NOTE) The eGFR has been calculated using the CKD EPI equation. This calculation has not been validated in all clinical situations. eGFR's persistently <60 mL/min signify possible Chronic Kidney Disease.    Anion gap 13 5 - 15  Troponin I     Status: Abnormal   Collection Time: 02/01/17 12:16 AM  Result Value Ref Range   Troponin I 0.03 (HH) <0.03 ng/mL    Comment: CRITICAL RESULT CALLED TO, READ BACK BY AND VERIFIED WITH KASEY ROBERTS @ 0112 ON 02/01/2017 BY CAF   Protime-INR     Status: None    Collection Time: 02/01/17 12:16 AM  Result Value Ref Range   Prothrombin Time 12.6 11.4 - 15.2 seconds   INR 0.94  APTT     Status: Abnormal   Collection Time: 02/01/17 12:16 AM  Result Value Ref Range   aPTT <24 (L) 24 - 36 seconds  Urine Drug Screen, Qualitative ([facility] only)     Status: Abnormal   Collection Time: 02/01/17 12:47 AM  Result Value Ref Range   Tricyclic, Ur Screen NONE DETECTED NONE DETECTED   Amphetamines, Ur Screen NONE DETECTED NONE DETECTED   MDMA (Ecstasy)Ur Screen NONE DETECTED NONE DETECTED   Cocaine Metabolite,Ur [place] POSITIVE (A) NONE DETECTED   Opiate, Ur Screen NONE DETECTED NONE DETECTED   Phencyclidine (PCP) Ur S NONE DETECTED NONE DETECTED   Cannabinoid 50 Ng, Ur [place] POSITIVE (A) NONE DETECTED   Barbiturates, Ur Screen NONE DETECTED NONE DETECTED   Benzodiazepine, Ur Scrn NONE DETECTED NONE DETECTED   Methadone Scn, Ur NONE DETECTED NONE DETECTED    Comment: (NOTE) 413  Tricyclics, urine               Cutoff 1000 ng/mL 200  Amphetamines, urine             Cutoff 1000 ng/mL 300  MDMA (Ecstasy), urine           Cutoff 500 ng/mL 400  Cocaine Metabolite, urine       Cutoff 300 ng/mL 500  Opiate, urine                   Cutoff 300 ng/mL 600  Phencyclidine (PCP), urine      Cutoff 25 ng/mL 700  Cannabinoid, urine              Cutoff 50 ng/mL 800  Barbiturates, urine             Cutoff 200 ng/mL 900  Benzodiazepine, urine           Cutoff 200 ng/mL 1000 Methadone, urine                Cutoff 300 ng/mL 1100 1200 The urine drug screen provides only a preliminary, unconfirmed 1300 analytical test result and should not be used for non-medical 1400 purposes. Clinical consideration and professional judgment should 1500 be applied to any positive drug screen result due to possible 1600 interfering substances. A more specific alternate chemical method 1700 must be used in order to obtain a confirmed analytical result.  1800 Gas chromato graphy / mass spectrometry  (GC/MS) is the preferred 1900 confirmatory method.   Blood gas, venous     Status: Abnormal (Preliminary result)   Collection Time: 02/01/17  1:41 AM  Result Value Ref Range   FIO2 PENDING    Delivery systems PENDING    pH, Ven 7.35 7.250 - 7.430   pCO2, Ven 40 (L) 44.0 - 60.0 mmHg   pO2, Ven 68.0 (H) 32.0 - 45.0 mmHg   Bicarbonate 22.1 20.0 - 28.0 mmol/L   Acid-base deficit 3.3 (H) 0.0 - 2.0 mmol/L   O2 Saturation 92.3 %   Patient temperature 37.0    Collection site VENOUS    Sample type VENOUS   Glucose, capillary     Status: Abnormal   Collection Time: 02/01/17  1:52 AM  Result Value Ref Range   Glucose-Capillary 352 (H) 65 - 99 mg/dL   Ct Head Wo Contrast  Result Date: 02/01/2017 CLINICAL DATA:  Right arm numbness 3 days ago EXAM: CT HEAD WITHOUT CONTRAST TECHNIQUE: Contiguous axial images were obtained from the base of the skull through the vertex without intravenous contrast. COMPARISON:  None.  FINDINGS: Brain: No evidence of acute infarction, hemorrhage, hydrocephalus, extra-axial collection or mass lesion/mass effect. Vascular: No hyperdense vessel or unexpected calcification. Skull: Normal. Negative for fracture or focal lesion. Sinuses/Orbits: No acute finding. Other: None IMPRESSION: No CT evidence for acute intracranial abnormality. Electronically Signed   By: Donavan Foil M.D.   On: 02/01/2017 00:53    Review of Systems  Constitutional: Negative for chills and fever.  HENT: Negative for sore throat and tinnitus.   Eyes: Negative for blurred vision and redness.  Respiratory: Negative for cough and shortness of breath.   Cardiovascular: Positive for chest pain. Negative for palpitations, orthopnea and PND.  Gastrointestinal: Negative for abdominal pain, diarrhea, nausea and vomiting.  Genitourinary: Negative for dysuria, frequency and urgency.  Musculoskeletal: Negative for joint pain and myalgias.  Skin: Negative for rash.       No lesions  Neurological: Positive  for sensory change and focal weakness. Negative for speech change and weakness.  Endo/Heme/Allergies: Does not bruise/bleed easily.       No temperature intolerance  Psychiatric/Behavioral: Negative for depression and suicidal ideas.    Blood pressure 126/88, pulse 65, temperature 98.5 F (36.9 C), resp. rate 15, weight 99.8 kg (220 lb), SpO2 98 %. Physical Exam  Nursing note and vitals reviewed. Constitutional: He is oriented to person, place, and time. He appears well-developed and well-nourished. No distress.  HENT:  Head: Normocephalic and atraumatic.  Mouth/Throat: Oropharynx is clear and moist.  Eyes: Conjunctivae and EOM are normal. Pupils are equal, round, and reactive to light. No scleral icterus.  Neck: Normal range of motion. Neck supple. No JVD present. No tracheal deviation present. No thyromegaly present.  Cardiovascular: Normal rate, regular rhythm and normal heart sounds.  Exam reveals no gallop and no friction rub.   No murmur heard. Respiratory: Effort normal and breath sounds normal. No respiratory distress.  GI: Soft. Bowel sounds are normal. He exhibits no distension. There is no tenderness.  Genitourinary:  Genitourinary Comments: Deferred  Musculoskeletal: Normal range of motion. He exhibits no edema.  Lymphadenopathy:    He has no cervical adenopathy.  Neurological: He is alert and oriented to person, place, and time. No cranial nerve deficit.  Decreased motor strength in right leg and arm.  Skin: Skin is warm and dry. No rash noted. No erythema.  Psychiatric: He has a normal mood and affect. His behavior is normal. Judgment and thought content normal.     Assessment/Plan This is a 51 year old male admitted for TIA. 1. TIA: The patient still has numbness in his right arm and right leg. Both extremities are slightly weaker than the left side. The patient has no difficulty swallowing or speaking. His chest pain is atypical and may be related to his cocaine use.  This may also be the etiology of his neurologic symptoms. MRA of the brain ordered as well as echocardiogram and carotid ultrasounds. 2. Diabetes mellitus type 2: Basal insulin therapy adjusted for hospital diet. Sliding scale insulin while hospitalized. Cymbalta for neuropathy 3. Essential hypertension: Controlled; continue amlodipine, hydrochlorothiazide and lisinopril 4. Cocaine abuse: Avoid beta blockers 5. BPH: Continue Terazosin  6. DVT prophylaxis: Lovenox 7. GI prophylaxis: None The patient is a full code. Time spent on admission orders and patient care possibly 45 minutes  Harrie Foreman, MD 02/01/2017, 3:23 AM

## 2017-02-01 NOTE — ED Notes (Addendum)
Pt going in and out of sleep, pt's O2 88% on room air. Pt states he wears cpap at home when he sleeps. 2L of oxygen applied to pt at this time.

## 2017-02-01 NOTE — Evaluation (Signed)
Physical Therapy Evaluation Patient Details Name: Christopher [name] MRN: [id number] DOB: [birth year] Today's Date: 02/01/2017   History of Present Illness  Pt is a 51 y.o. M with past medical history of diabetes with neuropathy presents to the emergency complaining of back pain. The patient states that he was in his usual state of health this evening after he finished supper when his back began to hurt under his right shoulder blade. The pain radiated into his armpit and right flank and at times into his right chest. The pain seemed to be colicky. It was dull in character. The patient also noted at that time that his right arm and right lower extremity also began to feel weak. He came to the emergency department for evaluation where he was found to be very hyperglycemic with a blood sugar of 570. He was given 10 units of insulin IV. CT of the head was negative for acute process. Notably, the patient's urine tox screen was positive for cocaine. Telemetry was not grossly abnormal. Nonetheless neurology telemedicine recommended rule out of stroke which prompted the emergency department staff to call the hospitalist service for admission.    Clinical Impression  Pt presents with mild deficits in RUE and RLE strength, RAMs, and sensation to light touch compared to LUE/LLE.  Pt also presents with mild deficits in transfers, gait, balance, and activity tolerance.  Pt was independent with bed mobility tasks and SBA with transfers.  Pt able to amb 20' with RW and CGA with slow cadence and short B step length but was limited by c/o dizziness.  BP 126/80 mmHg taken upon return to sitting with SpO2 100% and HR 66 bpm.  Pt will benefit from HHPT services to address above deficits for decreased caregiver assistance upon discharge.       Follow Up Recommendations Home health PT    Equipment Recommendations  Rolling walker with 5" wheels    Recommendations for Other Services       Precautions / Restrictions  Precautions Precautions: Fall Restrictions Weight Bearing Restrictions: No      Mobility  Bed Mobility Overal bed mobility: Independent                Transfers Overall transfer level: Needs assistance Equipment used: Rolling walker (2 wheeled) Transfers: Sit to/from Stand Sit to Stand: Supervision            Ambulation/Gait Ambulation/Gait assistance: Min guard Ambulation Distance (Feet): 20 Feet Assistive device: Rolling walker (2 wheeled) Gait Pattern/deviations: Decreased step length - right;Decreased step length - left   Gait velocity interpretation: Below normal speed for age/gender General Gait Details: Pt c/o dizziness with amb with BP taken upon return to sitting 126/80 mmHg, nsg aware.   Stairs Stairs:  (Deferred secondary to dizziness with ambulation)          Wheelchair Mobility    Modified Rankin (Stroke Patients Only)       Balance Overall balance assessment: Needs assistance Sitting-balance support: Feet supported;No upper extremity supported Sitting balance-Leahy Scale: Good     Standing balance support: No upper extremity supported Standing balance-Leahy Scale: Good                               Pertinent Vitals/Pain Pain Assessment: 0-10 Pain Score: 8  Pain Location: R scapular area and general back pain  Pain Descriptors / Indicators: Aching;Sore Pain Intervention(s): Premedicated before session;Monitored during session    Home Living  Family/patient expects to be discharged to:: Private residence Living Arrangements: Other relatives Available Help at Discharge: Family Type of Home: House Home Access: Stairs to enter Entrance Stairs-Rails: Left Entrance Stairs-Number of Steps: 4 Home Layout: One level Home Equipment: None      Prior Function           Comments: Ind amb without AD community distances, no fall history, Ind with ADLs     Hand Dominance   Dominant Hand: Right    Extremity/Trunk  Assessment   Upper Extremity Assessment Upper Extremity Assessment: Generalized weakness    Lower Extremity Assessment Lower Extremity Assessment: Generalized weakness       Communication   Communication: No difficulties  Cognition Arousal/Alertness: Awake/alert Behavior During Therapy: WFL for tasks assessed/performed Overall Cognitive Status: Within Functional Limits for tasks assessed                                        General Comments      Exercises Total Joint Exercises Ankle Circles/Pumps: AROM;Both;5 reps Long Arc Quad: Strengthening;Both;5 reps Knee Flexion: Strengthening;Both;5 reps Marching in Standing: AROM;Both;10 reps Other Exercises Other Exercises: Static balance training with feet apart and together with combinations of eyes open/closed and head still/head turns with good overall stability   Assessment/Plan    PT Assessment Patient needs continued PT services  PT Problem List Decreased strength;Decreased activity tolerance;Decreased balance;Decreased mobility;Decreased knowledge of use of DME       PT Treatment Interventions DME instruction;Gait training;Stair training;Functional mobility training;Neuromuscular re-education;Balance training;Therapeutic exercise;Therapeutic activities;Patient/family education    PT Goals (Current goals can be found in the Care Plan section)  Acute Rehab PT Goals Patient Stated Goal: To get stronger PT Goal Formulation: With patient Time For Goal Achievement: 02/14/17 Potential to Achieve Goals: Good    Frequency 7X/week   Barriers to discharge        Co-evaluation               AM-PAC PT "6 Clicks" Daily Activity  Outcome Measure Difficulty turning over in bed (including adjusting bedclothes, sheets and blankets)?: None Difficulty moving from lying on back to sitting on the side of the bed? : None Difficulty sitting down on and standing up from a chair with arms (e.g., wheelchair,  bedside commode, etc,.)?: A Little Help needed moving to and from a bed to chair (including a wheelchair)?: A Little Help needed walking in hospital room?: A Little Help needed climbing 3-5 steps with a railing? : A Lot 6 Click Score: 19    End of Session Equipment Utilized During Treatment: Gait belt Activity Tolerance: Other (comment) (Pt limited by dizziness during ambulation) Patient left: in bed;with call bell/phone within reach;with nursing/sitter in room;with bed alarm set Nurse Communication: Mobility status;Other (comment) (Nsg gave ok for PT on room air; nsg informed of vital signs on room air, pt's dizziness with amb, nsg in room at end of session) PT Visit Diagnosis: Difficulty in walking, not elsewhere classified (R26.2);Muscle weakness (generalized) (M62.81)    Time: 3154-0086 PT Time Calculation (min) (ACUTE ONLY): 32 min   Charges:   PT Evaluation $PT Eval Moderate Complexity: 1 Procedure PT Treatments $Therapeutic Exercise: 8-22 mins   PT G Codes:   PT G-Codes **NOT FOR INPATIENT CLASS** Functional Assessment Tool Used: AM-PAC 6 Clicks Basic Mobility Functional Limitation: Mobility: Walking and moving around Mobility: Walking and Moving Around Current Status (P6195):  At least 20 percent but less than 40 percent impaired, limited or restricted Mobility: Walking and Moving Around Goal Status [phone]): 0 percent impaired, limited or restricted    D. Scott Lamyah Creed PT, DPT 02/01/17, 10:53 AM

## 2017-02-01 NOTE — ED Triage Notes (Signed)
Patient comes in from home via [facility] with high blood sugar and cramps in his hands. When patient entered room he was hyperventilating and placed on NRB mask. EDP at bedside. Per EMS patient has been here several times with this same complaint. Per patient he is complaint with his medications. EMS did get iv and gave 570ml normal saline. Patient reports increased thirst and urination. Patient also reports having right arm numbness that started 3 days ago. Patient alert and oriented x4, speaking in complete sentences. Cramps in hands has improved since arrival. BS per EMS was 570.

## 2017-02-01 NOTE — Progress Notes (Signed)
OT Cancellation Note  Patient Details Name: Charleston Hankin MRN: [id number] DOB: [birth year]   Cancelled Treatment:    Reason Eval/Treat Not Completed: Patient at procedure or test/ unavailable  Harrel Carina, MS, OTR/L 02/01/2017, 11:18 AM

## 2017-02-01 NOTE — Evaluation (Signed)
Occupational Therapy Evaluation Patient Details Name: Adreyan Carbajal MRN: [id number] DOB: [birth year] Today's Date: 02/01/2017    History of Present Illness Pt is a 51 y.o. M with past medical history of diabetes with neuropathy presents to the emergency complaining of back pain. The patient states that he was in his usual state of health this evening after he finished supper when his back began to hurt under his right shoulder blade. The pain radiated into his armpit and right flank and at times into his right chest. The pain seemed to be colicky. It was dull in character. The patient also noted at that time that his right arm and right lower extremity also began to feel weak. He came to the emergency department for evaluation where he was found to be very hyperglycemic with a blood sugar of 570. He was given 10 units of insulin IV. CT of the head was negative for acute process. Notably, the patient's urine tox screen was positive for cocaine. Telemetry was not grossly abnormal. Nonetheless neurology telemedicine recommended rule out of stroke which prompted the emergency department staff to call the hospitalist service for admission.   Clinical Impression   Pt. Presents with RUE 8/10 pain, weakness, decreased grip strength (right 10#, Left 65#) and pt. has difficulty manipulating ADL items. Pt. reports pain from his right shoulder blade, through the armpit, and flank pain. Pt. positions his RUE with his hand on his head for a position of comfort. RUE pain does limit functional performance. Pt. could benefit from skilled OT services for ADL training, UE there. Ex, there. Act., and pt. education about home modification, and DME. Pt. Could benefit from follow-up OT services upon discharge.     Follow Up Recommendations  HHOT    Equipment Recommendations       Recommendations for Other Services       Precautions / Restrictions                                                       ADL either performed or assessed with clinical judgement   ADL Overall ADL's : Needs assistance/impaired Eating/Feeding: Minimal assistance;Set up   Grooming: Set up;Minimal assistance   Upper Body Bathing: Minimal assistance   Lower Body Bathing: Minimal assistance   Upper Body Dressing : Minimal assistance   Lower Body Dressing: Minimal assistance               Functional mobility during ADLs: Min guard       Vision Baseline Vision/History: Wears glasses Wears Glasses: Reading only Vision Assessment?: Vision impaired- to be further tested in functional context     Perception     Praxis      Pertinent Vitals/Pain Pain Assessment: 0-10 Pain Score: 0-No pain Pain Location: R scapular area and general back pain  Pain Descriptors / Indicators: Aching;Sore Pain Intervention(s): Monitored during session;Premedicated before session     Hand Dominance     Extremity/Trunk Assessment             Communication     Cognition Arousal/Alertness: Awake/alert Behavior During Therapy: WFL for tasks assessed/performed Overall Cognitive Status: Within Functional Limits for tasks assessed  General Comments       Exercises     Shoulder Instructions      Home Living                                          Prior Functioning/Environment   Pt. was independent with ADLs, IADLs, and driving.                OT Problem List:        OT Treatment/Interventions:      OT Goals(Current goals can be found in the care plan section) Acute Rehab OT Goals Patient Stated Goal: To get stronger  OT Frequency: Min 2X/week   Barriers to D/C:            Co-evaluation              AM-PAC PT "6 Clicks" Daily Activity     Outcome Measure Help from another person eating meals?: A Little Help from another person taking care of personal grooming?: A Little Help from another person  toileting, which includes using toliet, bedpan, or urinal?: A Little Help from another person bathing (including washing, rinsing, drying)?: A Little Help from another person to put on and taking off regular upper body clothing?: A Little Help from another person to put on and taking off regular lower body clothing?: A Little 6 Click Score: 18   End of Session    Activity Tolerance: Patient tolerated treatment well Patient left: in bed;with call bell/phone within reach;with bed alarm set  OT Visit Diagnosis: Muscle weakness (generalized) (M62.81)                Time: 5397-6734 OT Time Calculation (min): 29 min Charges:  OT General Charges $OT Visit: 1 Procedure OT Evaluation $OT Eval Moderate Complexity: 1 Procedure G-Codes: OT G-codes **NOT FOR INPATIENT CLASS** Functional Limitation: Self care Self Care Current Status (L9379): At least 20 percent but less than 40 percent impaired, limited or restricted Self Care Goal Status (K2409): At least 1 percent but less than 20 percent impaired, limited or restricted   Harrel Carina, MS, OTR/L  Harrel Carina, MS, OTR/L 02/01/2017, 3:28 PM

## 2017-02-01 NOTE — ED Notes (Signed)
Notified MD Marcille Blanco pt had passes swallow screen and if pt can have floor order for diet.

## 2017-02-01 NOTE — Progress Notes (Signed)
SLP Cancellation Note  Patient Details Name: Christopher [name] MRN: [id number] DOB: 28-Sep-1965   Cancelled treatment:       Reason Eval/Treat Not Completed: SLP screened, no needs identified, will sign off (chart reviewed; NSG consulted; Met w/ pt) Pt denied any difficulty swallowing and is currently on a regular diet; tolerates swallowing pills w/ water per NSG. Pt conversed briefly at conversational level w/out deficits noted; pt denied any speech-language deficits.  No further skilled ST services indicated as pt appears at his baseline. Pt agreed. NSG to reconsult if any change in status.     Orinda Kenner, MS, CCC-SLP Watson,Katherine 02/01/2017, 11:42 AM

## 2017-02-01 NOTE — Progress Notes (Signed)
Patient seen and examined. Chart reviewed. Follow up on CVA workup Diabetes coordinator consult Patient has some white spots around penis will add Diflucan Check hemoglobin A1c

## 2017-02-01 NOTE — ED Notes (Signed)
Pt transported to room 112. 

## 2017-02-01 NOTE — Progress Notes (Signed)
*  PRELIMINARY RESULTS* Echocardiogram 2D Echocardiogram has been performed.  Sherrie Sport 02/01/2017, 9:21 AM

## 2017-02-01 NOTE — ED Notes (Addendum)
Pt passed swallow screen, see chart. Pt given apple sauce and tolerated it well.

## 2017-02-01 NOTE — ED Provider Notes (Signed)
[facility] Emergency Department Provider Note   ____________________________________________   First MD Initiated Contact with Patient 02/01/17 0006     (approximate)  I have reviewed the triage vital signs and the nursing notes.   HISTORY  Chief Complaint Hyperglycemia    HPI Christopher [name] is a 51 y.o. male who comes into the hospital today with hyperglycemia. The patient called EMS because of hyperglycemia and cramps in his hands. The patient takes insulin 5 times a day. His blood sugar is 570. According to EMS the patient has been seen here 2-3 times in the last couple of weeks. The patient reports that he has been taking his medication without missing any. He saw his doctor and they increased his insulin dose. The patient states that he took his blood sugar as well as his medications about 2 hours ago and the cramps started. His mouth is dry and he's been drinking a lot today. The patient states that after he took a shot as when he had his hand cramping. The patient also reports some numbness and weakness in his right arm. He reports it started 3 days ago. He noticed some slurred speech tonight after he called EMS. The patient denies any cough, runny nose, nausea, vomiting, chest pain. The patient has had some increase in his urinary frequency. He is here today for evaluation.   Past Medical History:  Diagnosis Date  . Anxiety   . Arthritis   . Cancer [facility])    Prostate  . Diabetes mellitus without complication ([place])   . Hyperlipidemia   . Hypertension   . Neuropathy   . Neuropathy   . Sleep apnea     Patient Active Problem List   Diagnosis Date Noted  . Cramps, muscle, general 07/04/2015    Past Surgical History:  Procedure Laterality Date  . APPENDECTOMY      Prior to Admission medications   Medication Sig Start Date End Date Taking? Authorizing Provider  amLODipine (NORVASC) 5 MG tablet Take 1 tablet by mouth daily. 03/12/14  Yes  [provider]  aspirin EC 81 MG tablet Take 81 mg by mouth daily.   Yes [provider]  atorvastatin (LIPITOR) 20 MG tablet Take 20 mg by mouth daily at 6 PM.   Yes [provider]  cetirizine (ZYRTEC) 10 MG tablet Take 1 tablet by mouth daily. 07/01/14  Yes [provider]  cyclobenzaprine (FLEXERIL) 10 MG tablet Take 1 tablet (10 mg total) by mouth 3 (three) times daily as needed for muscle spasms. 07/06/15  Yes Demetrios Loll, MD  DULoxetine (CYMBALTA) 30 MG capsule Take 1 capsule by mouth daily. 07/01/14  Yes [provider]  hydrochlorothiazide (HYDRODIURIL) 25 MG tablet Take 1 tablet by mouth daily. 01/22/14  Yes [provider]  ibuprofen (ADVIL,MOTRIN) 600 MG tablet Take 1 tablet (600 mg total) by mouth every 6 (six) hours as needed. 01/02/16  Yes Triplett, Cari B, FNP  insulin aspart (NOVOLOG) 100 UNIT/ML FlexPen Inject 5-20 Units into the skin 3 (three) times daily with meals. Sliding scale 01/22/14  Yes [provider]  insulin glargine (LANTUS) 100 UNIT/ML injection Inject 60 Units into the skin 2 (two) times daily.    Yes [provider]  lisinopril (PRINIVIL,ZESTRIL) 10 MG tablet Take 1 tablet by mouth daily. 01/22/14  Yes [provider]  LORazepam (ATIVAN) 0.5 MG tablet Take 0.5 mg by mouth daily. 07/01/14  Yes [provider]  oxyCODONE-acetaminophen (ROXICET) 5-325 MG tablet  Take 1 tablet by mouth every 6 (six) hours as needed. 01/02/16  Yes Triplett, Cari B, FNP  pregabalin (LYRICA) 100 MG capsule Take 1 capsule by mouth 2 (two) times daily.    Yes [provider]  terazosin (HYTRIN) 1 MG capsule Take 1 capsule by mouth daily. 03/10/14  Yes [provider]  amoxicillin (AMOXIL) 500 MG capsule Take 1 capsule (500 mg total) by mouth 3 (three) times daily. Patient not taking: Reported on 02/01/2017 09/28/16   Sable Feil, PA-C  amoxicillin (AMOXIL) 500 MG capsule Take 1 capsule (500 mg  total) by mouth 3 (three) times daily. Patient not taking: Reported on 02/01/2017 01/10/17   Sable Feil, PA-C  amoxicillin (AMOXIL) 500 MG tablet Take 1 tablet (500 mg total) by mouth 3 (three) times daily. Patient not taking: Reported on 02/01/2017 01/02/16   Sherrie George B, FNP  fluticasone (FLONASE) 50 MCG/ACT nasal spray Place 2 sprays into both nostrils daily. 01/10/15 01/10/16  Johnn Hai, PA-C  sulfamethoxazole-trimethoprim (BACTRIM DS,SEPTRA DS) 800-160 MG per tablet Take 1 tablet by mouth 2 (two) times daily. Patient not taking: Reported on 07/04/2015 03/13/15   Sable Feil, PA-C    Allergies Tramadol  Family History  Problem Relation Age of Onset  . Cancer Father   . Stroke Father     Social History Social History  Substance Use Topics  . Smoking status: Never Smoker  . Smokeless tobacco: Never Used  . Alcohol use No    Review of Systems  Constitutional: Hyperglycemia Eyes: No visual changes. ENT: No sore throat. Cardiovascular: Denies chest pain. Respiratory: Denies shortness of breath. Gastrointestinal: No abdominal pain.  No nausea, no vomiting.  No diarrhea.  No constipation. Genitourinary: Urinary frequency Musculoskeletal: Hand cramping Skin: Negative for rash. Neurological: Right-sided numbness and weakness   ____________________________________________   PHYSICAL EXAM:  VITAL SIGNS: ED Triage Vitals [02/01/17 0022]  Enc Vitals Group     BP 161/100     Pulse 99     Resp 27     Temp 98.3     Temp src      SpO2 100%     Weight 220 lb (99.8 kg)     Height      Head Circumference      Peak Flow      Pain Score      Pain Loc      Pain Edu?      Excl. in [place]?     Constitutional: Alert and oriented. Well appearing and in Moderate distress. Eyes: Conjunctivae are normal. PERRL. EOMI. Head: Atraumatic. Nose: No congestion/rhinnorhea. Mouth/Throat: Mucous membranes are moist.  Oropharynx non-erythematous. Cardiovascular: Normal  rate, regular rhythm. Grossly normal heart sounds.  Good peripheral circulation. Respiratory: Normal respiratory effort.  No retractions. Lungs CTAB. Gastrointestinal: Soft and nontender. No distention. Positive bowel sounds Musculoskeletal: No lower extremity tenderness nor edema.   Neurologic:  Normal speech and language. Right-sided facial numbness with no facial droop. Right upper extremity numbness and weakness with no pronator drift. Patient does have some difficulty with finger to nose. Right lower extremity numbness and weakness approximately a 4 out of 5 strength upper and lower extremity. Skin:  Skin is warm, dry and intact.  Psychiatric: Mood and affect are normal.   ____________________________________________   LABS (all labs ordered are listed, but only abnormal results are displayed)  Labs Reviewed  GLUCOSE, CAPILLARY - Abnormal; Notable for the following:       Result Value  Glucose-Capillary 501 (*)    All other components within normal limits  COMPREHENSIVE METABOLIC PANEL - Abnormal; Notable for the following:    Sodium 127 (*)    Chloride 97 (*)    CO2 17 (*)    Glucose, Bld 531 (*)    ALT 15 (*)    Total Bilirubin 1.3 (*)    All other components within normal limits  TROPONIN I - Abnormal; Notable for the following:    Troponin I 0.03 (*)    All other components within normal limits  APTT - Abnormal; Notable for the following:    aPTT <24 (*)    All other components within normal limits  URINE DRUG SCREEN, QUALITATIVE ([facility] ONLY) - Abnormal; Notable for the following:    Cocaine Metabolite,Ur [place] POSITIVE (*)    Cannabinoid 50 Ng, Ur [place] POSITIVE (*)    All other components within normal limits  BLOOD GAS, VENOUS - Abnormal; Notable for the following:    pCO2, Ven 40 (*)    pO2, Ven 68.0 (*)    Acid-base deficit 3.3 (*)    All other components within normal limits  GLUCOSE, CAPILLARY - Abnormal; Notable for the following:    Glucose-Capillary 352 (*)     All other components within normal limits  CBC  PROTIME-INR   ____________________________________________  EKG  ED ECG REPORT I, Loney Hering, the attending physician, personally viewed and interpreted this ECG.   Date: 02/01/2017  EKG Time: 0020  Rate: 92  Rhythm: normal sinus rhythm  Axis: Normal  Intervals:Incomplete left bundle branch block  ST&T Change: ST segment depression in leads II, III, and F aVF as well as some flipped T waves in leads V6.  ____________________________________________  RADIOLOGY  Ct Head Wo Contrast  Result Date: 02/01/2017 CLINICAL DATA:  Right arm numbness 3 days ago EXAM: CT HEAD WITHOUT CONTRAST TECHNIQUE: Contiguous axial images were obtained from the base of the skull through the vertex without intravenous contrast. COMPARISON:  None. FINDINGS: Brain: No evidence of acute infarction, hemorrhage, hydrocephalus, extra-axial collection or mass lesion/mass effect. Vascular: No hyperdense vessel or unexpected calcification. Skull: Normal. Negative for fracture or focal lesion. Sinuses/Orbits: No acute finding. Other: None IMPRESSION: No CT evidence for acute intracranial abnormality. Electronically Signed   By: Donavan Foil M.D.   On: 02/01/2017 00:53    ____________________________________________   PROCEDURES  Procedure(s) performed: None  Procedures  Critical Care performed: Yes, see critical care note(s)  CRITICAL CARE Performed by: Charlesetta Ivory P   Total critical care time: 30 minutes  Critical care time was exclusive of separately billable procedures and treating other patients.  Critical care was necessary to treat or prevent imminent or life-threatening deterioration.  Critical care was time spent personally by me on the following activities: development of treatment plan with patient and/or surrogate as well as nursing, discussions with consultants, evaluation of patient's response to treatment, examination of  patient, obtaining history from patient or surrogate, ordering and performing treatments and interventions, ordering and review of laboratory studies, ordering and review of radiographic studies, pulse oximetry and re-evaluation of patient's condition.  ____________________________________________   INITIAL IMPRESSION / ASSESSMENT AND PLAN / ED COURSE  Pertinent labs & imaging results that were available during my care of the patient were reviewed by me and considered in my medical decision making (see chart for details).  This is a 51 year old male who comes into the hospital today with hyperglycemia. On history taking the patient  does endorse some weakness with a concern for possible stroke. The patient states the symptoms started 3 days ago so I do not feel that this is a code stroke. I will give the patient a liter of normal saline. I will send him for a CT scan and I will check some blood work. I will also have the patient evaluated by tele-neurology. The patient will be reassessed once he's had his blood work as well as his imaging studies. The patient has an NIH stroke scale of 3. Given that his symptoms started 3 days ago he is also out of the window of TPA. He will be reassessed.  Clinical Course as of Feb 02 243  Fri Feb 01, 2017  0141 No CT evidence for acute intracranial abnormality. CT Head Wo Contrast [AW]    Clinical Course User Index [AW] Loney Hering, MD   The patient was seen by the tele-neurologist who recommended admission for the patient for further stroke evaluation. After the liter of fluid the patient's blood sugars in the 300s. I will give him 10 units of insulin and he will be admitted to the hospitalist service.  ____________________________________________   FINAL CLINICAL IMPRESSION(S) / ED DIAGNOSES  Final diagnoses:  Hyperglycemia  Weakness  Numbness  Cerebrovascular accident (CVA), unspecified mechanism ([place])      NEW MEDICATIONS STARTED DURING  THIS VISIT:  New Prescriptions   No medications on file     Note:  This document was prepared using Dragon voice recognition software and may include unintentional dictation errors.    Loney Hering, MD 02/01/17 [phone]

## 2017-02-01 NOTE — Care Management (Signed)
Admitted to [facility] with the diagnosis of TIA under observation status. CT head negative for infarct. MRI negative for infarct.  Physical therapy evaluation completed. Recommends Home with Home Health and Physical therapy. Mr. Swager has medicaid as insurance. Medical will not pay for these services.  Shelbie Ammons RN MSN CCM Care Management [phone]

## 2017-02-01 NOTE — Progress Notes (Signed)
Chaplain received an order to visit with pt in room 112. Pt said that he was battling depression and was on medication. Pt was having problems on his right side with swallowing food. Pt was very emotional. Chaplain provided the ministry of prayer and emotional supp   02/01/17 0930  Clinical Encounter Type  Visited With Patient  Visit Type Initial;Spiritual support  Referral From Nurse  Consult/Referral To Chaplain  Spiritual Encounters  Spiritual Needs Prayer;Emotional;Grief support  ort.

## 2017-02-01 NOTE — Progress Notes (Signed)
Inpatient Diabetes Program Recommendations  AACE/ADA: New Consensus Statement on Inpatient Glycemic Control (2015)  Target Ranges:  Prepandial:   less than 140 mg/dL      Peak postprandial:   less than 180 mg/dL (1-2 hours)      Critically ill patients:  140 - 180 mg/dL   Lab Results  Component Value Date   GLUCAP 114 (H) 02/01/2017    Review of Glycemic Control  Results for DAMYEN, KNOLL (MRN [id number]) as of 02/01/2017 09:48  Ref. Range 02/01/2017 00:16 02/01/2017 01:52 02/01/2017 04:58 02/01/2017 05:37 02/01/2017 07:55  Glucose-Capillary Latest Ref Range: 65 - 99 mg/dL 501 (HH) 352 (H) 152 (H) 101 (H) 114 (H)    Diabetes history: Type 2, A1C pending  (A1C in February 2017- 12.6%)  Outpatient Diabetes medications: Novolog 5-20 units tid, Lantus 60 units bid- confirmed (by insulin pen)  Current orders for Inpatient glycemic control: Lantus 80 units qhs, Novolog 0-20 units tid   Inpatient Diabetes Program Recommendations:   Consider changing Lantus to 40 bid for better absorption, add Novolog 5 units tid (hold if patient eats less than 50%) and decrease Novolog correction to moderate correction scale 0-15 units tid.  Gentry Fitz, RN, BA, MHA, CDE Diabetes Coordinator Inpatient Diabetes Program  [phone] (Team Pager) [phone] ([place]) 02/01/2017 9:57 AM

## 2017-02-01 NOTE — ED Notes (Signed)
Notified RN Anderson Malta that this RN is in with a critical pt and can not leave bedside and to check on pt for pain and VS. RN Anderson Malta states she will follow up with pt.

## 2017-02-02 LAB — GLUCOSE, CAPILLARY
Glucose-Capillary: 235 mg/dL — ABNORMAL HIGH (ref 65–99)
Glucose-Capillary: 118 mg/dL — ABNORMAL HIGH (ref 65–99)

## 2017-02-02 MED ORDER — LORAZEPAM 0.5 MG PO TABS: 0.5000 mg | ORAL_TABLET | Freq: Every day | ORAL | 0 refills | Status: DC

## 2017-02-02 MED ORDER — TERAZOSIN HCL 1 MG PO CAPS: 1.0000 mg | ORAL_CAPSULE | Freq: Every day | ORAL | 0 refills | Status: DC

## 2017-02-02 MED ORDER — AMLODIPINE BESYLATE 5 MG PO TABS: 5.0000 mg | ORAL_TABLET | Freq: Every day | ORAL | 0 refills | Status: DC

## 2017-02-02 MED ORDER — DULOXETINE HCL 30 MG PO CPEP: 30.0000 mg | ORAL_CAPSULE | Freq: Every day | ORAL | 0 refills | Status: DC

## 2017-02-02 MED ORDER — ASPIRIN EC 81 MG PO TBEC: 81.0000 mg | DELAYED_RELEASE_TABLET | Freq: Every day | ORAL | Status: DC

## 2017-02-02 MED ORDER — INSULIN GLARGINE 100 UNIT/ML ~~LOC~~ SOLN: 60.0000 [IU] | Freq: Two times a day (BID) | SUBCUTANEOUS | 0 refills | Status: DC

## 2017-02-02 MED ORDER — HYDROCHLOROTHIAZIDE 25 MG PO TABS: 25.0000 mg | ORAL_TABLET | Freq: Every day | ORAL | 0 refills | Status: DC

## 2017-02-02 MED ORDER — NYSTATIN 100000 UNIT/GM EX CREA: TOPICAL_CREAM | Freq: Two times a day (BID) | CUTANEOUS | 0 refills | Status: DC

## 2017-02-02 MED ORDER — CETIRIZINE HCL 10 MG PO TABS: 10.0000 mg | ORAL_TABLET | Freq: Every day | ORAL | 0 refills | Status: DC

## 2017-02-02 MED ORDER — LISINOPRIL 10 MG PO TABS: 10.0000 mg | ORAL_TABLET | Freq: Every day | ORAL | 0 refills | Status: DC

## 2017-02-02 MED ORDER — PREGABALIN 100 MG PO CAPS: 100.0000 mg | ORAL_CAPSULE | Freq: Two times a day (BID) | ORAL | 0 refills | Status: DC

## 2017-02-02 NOTE — Progress Notes (Signed)
Pt's ride present at the visitor's entrance; pt discharged via wheelchair by nursing to the visitor's entrance 

## 2017-02-02 NOTE — Progress Notes (Signed)
PT Cancellation Note  Patient Details Name: Christopher [name] MRN: [id number] DOB: 17-Jun-1966   Cancelled Treatment:    Reason Eval/Treat Not Completed: Patient declined, no reason specified   Pt approached and offered session.  Stating he is awaiting discharge and declines session.  Stated he has been ambulating with nursing to bathroom and feels comfortable with mobility.  Chesley Noon 02/02/2017, 12:05 PM

## 2017-02-02 NOTE — Progress Notes (Signed)
MD order received in [facility] to discharge pt home today; verbally reviewed AVS with pt, gave Rxs to pt; no questions voiced at this time; pt's discharge pending arrival of his ride home; verbally instructed pt to call on his nurse callbell when his ride arrived in order to take him to the visitor's entrance in a wheelchair

## 2017-02-02 NOTE — Discharge Summary (Signed)
[facility] [place] NAME: Christopher [name]    MR#:  [id number]  DATE OF BIRTH:  31-Mar-1966  DATE OF ADMISSION:  02/01/2017 ADMITTING PHYSICIAN: Harrie Foreman, MD  DATE OF DISCHARGE: 02/02/2017  PRIMARY CARE PHYSICIAN: Juanell Fairly, MD    ADMISSION DIAGNOSIS:  Numbness [R20.0] Weakness [R53.1] Hyperglycemia [R73.9] Cerebrovascular accident (CVA), unspecified mechanism ([place]) [I63.9]  DISCHARGE DIAGNOSIS:  Active Problems:   TIA (transient ischemic attack)   SECONDARY DIAGNOSIS:   Past Medical History:  Diagnosis Date  . Anxiety   . Arthritis   . Cancer [facility])    Prostate  . Diabetes mellitus without complication ([place])   . Hyperlipidemia   . Hypertension   . Neuropathy   . Neuropathy   . Sleep apnea     HOSPITAL COURSE:   51 year old male with history of diabetes and sleep apnea who presented complaining of back pain with weakness on the right side. He was also noted to have hyperglycemia.  1. Back/right shoulder blade pain: Patient presented with weakness due to the pain that he has been experiencing which is musculoskeletal in nature. He underwent CVA workup which was negative including MRI and carotid Doppler. Symptoms were not consistent with a neurological process.  2. Diabetes with hyperglycemia: Patient was restarted outpatient medications. He will continue with ADA diet and insulin  3. Cocaine abuse: Patient was encouraged to stop using illicit drugs  4. Essential hypertension: Patient will continue Norvasc, HCTZ and lisinopril  5. BPH: Continue trusses and DISCHARGE CONDITIONS AND DIET:   Stable for discharge and diabetic diet  CONSULTS OBTAINED:    DRUG ALLERGIES:   Allergies  Allergen Reactions  . Tramadol Nausea And Vomiting    DISCHARGE MEDICATIONS:   Current Discharge Medication List    START taking these medications   Details  nystatin cream (MYCOSTATIN) Apply topically 2 (two) times  daily. Qty: 30 g, Refills: 0      CONTINUE these medications which have CHANGED   Details  amLODipine (NORVASC) 5 MG tablet Take 1 tablet (5 mg total) by mouth daily. Qty: 30 tablet, Refills: 0    aspirin EC 81 MG tablet Take 1 tablet (81 mg total) by mouth daily.    cetirizine (ZYRTEC) 10 MG tablet Take 1 tablet (10 mg total) by mouth daily. Qty: 30 tablet, Refills: 0    DULoxetine (CYMBALTA) 30 MG capsule Take 1 capsule (30 mg total) by mouth daily. Qty: 30 capsule, Refills: 0    hydrochlorothiazide (HYDRODIURIL) 25 MG tablet Take 1 tablet (25 mg total) by mouth daily. Qty: 30 tablet, Refills: 0    insulin glargine (LANTUS) 100 UNIT/ML injection Inject 0.6 mLs (60 Units total) into the skin 2 (two) times daily. Qty: 10 mL, Refills: 0    lisinopril (PRINIVIL,ZESTRIL) 10 MG tablet Take 1 tablet (10 mg total) by mouth daily. Qty: 30 tablet, Refills: 0    LORazepam (ATIVAN) 0.5 MG tablet Take 1 tablet (0.5 mg total) by mouth daily. Qty: 10 tablet, Refills: 0    pregabalin (LYRICA) 100 MG capsule Take 1 capsule (100 mg total) by mouth 2 (two) times daily. Qty: 60 capsule, Refills: 0    terazosin (HYTRIN) 1 MG capsule Take 1 capsule (1 mg total) by mouth daily. Qty: 30 capsule, Refills: 0      CONTINUE these medications which have NOT CHANGED   Details  atorvastatin (LIPITOR) 20 MG tablet Take 20 mg by mouth daily at 6 PM.  cyclobenzaprine (FLEXERIL) 10 MG tablet Take 1 tablet (10 mg total) by mouth 3 (three) times daily as needed for muscle spasms. Qty: 30 tablet, Refills: 0    insulin aspart (NOVOLOG) 100 UNIT/ML FlexPen Inject 5-20 Units into the skin 3 (three) times daily with meals. Sliding scale    oxyCODONE-acetaminophen (ROXICET) 5-325 MG tablet Take 1 tablet by mouth every 6 (six) hours as needed. Qty: 9 tablet, Refills: 0    fluticasone (FLONASE) 50 MCG/ACT nasal spray Place 2 sprays into both nostrils daily. Qty: 9.9 g, Refills: 0      STOP taking these  medications     ibuprofen (ADVIL,MOTRIN) 600 MG tablet      amoxicillin (AMOXIL) 500 MG capsule      amoxicillin (AMOXIL) 500 MG capsule      amoxicillin (AMOXIL) 500 MG tablet      sulfamethoxazole-trimethoprim (BACTRIM DS,SEPTRA DS) 800-160 MG per tablet           Today   CHIEF COMPLAINT:   No acute issues overnight. No neurological deficits.   VITAL SIGNS:  Blood pressure 135/64, pulse 70, temperature 97.5 F (36.4 C), temperature source Oral, resp. rate 20, height 5\' 9"  (1.753 m), weight 84.9 kg (187 lb 3.2 oz), SpO2 98 %.   REVIEW OF SYSTEMS:  Review of Systems  Constitutional: Negative.  Negative for chills, fever and malaise/fatigue.  HENT: Negative.  Negative for ear discharge, ear pain, hearing loss, nosebleeds and sore throat.   Eyes: Negative.  Negative for blurred vision and pain.  Respiratory: Negative.  Negative for cough, hemoptysis, shortness of breath and wheezing.   Cardiovascular: Negative.  Negative for chest pain, palpitations and leg swelling.  Gastrointestinal: Negative.  Negative for abdominal pain, blood in stool, diarrhea, nausea and vomiting.  Genitourinary: Negative.  Negative for dysuria.  Musculoskeletal: Positive for joint pain. Negative for back pain.  Skin: Negative.   Neurological: Negative for dizziness, tremors, speech change, focal weakness, seizures and headaches.  Endo/Heme/Allergies: Negative.  Does not bruise/bleed easily.  Psychiatric/Behavioral: Negative.  Negative for depression, hallucinations and suicidal ideas.     PHYSICAL EXAMINATION:  GENERAL:  51 y.o.-year-old patient lying in the bed with no acute distress.  NECK:  Supple, no jugular venous distention. No thyroid enlargement, no tenderness.  LUNGS: Normal breath sounds bilaterally, no wheezing, rales,rhonchi  No use of accessory muscles of respiration.  CARDIOVASCULAR: S1, S2 normal. No murmurs, rubs, or gallops.  ABDOMEN: Soft, non-tender, non-distended. Bowel  sounds present. No organomegaly or mass.  EXTREMITIES: No pedal edema, cyanosis, or clubbing.  PSYCHIATRIC: The patient is alert and oriented x 3.  SKIN: No obvious rash, lesion, or ulcer.  Musculoskeletal: Patient exhibits good range of motion of right shoulder however has some tenderness under scapula DATA REVIEW:   CBC  Recent Labs Lab 02/01/17 0016  WBC 9.5  HGB 14.9  HCT 43.9  PLT 301    Chemistries   Recent Labs Lab 02/01/17 0016  NA 127*  K 3.9  CL 97*  CO2 17*  GLUCOSE 531*  BUN 10  CREATININE 1.11  CALCIUM 9.6  AST 29  ALT 15*  ALKPHOS 46  BILITOT 1.3*    Cardiac Enzymes  Recent Labs Lab 02/01/17 0016  TROPONINI 0.03*    Microbiology Results  @[email or web address] @  RADIOLOGY:  Ct Head Wo Contrast  Result Date: 02/01/2017 CLINICAL DATA:  Right arm numbness 3 days ago EXAM: CT HEAD WITHOUT CONTRAST TECHNIQUE: Contiguous axial images were obtained from the base of the  skull through the vertex without intravenous contrast. COMPARISON:  None. FINDINGS: Brain: No evidence of acute infarction, hemorrhage, hydrocephalus, extra-axial collection or mass lesion/mass effect. Vascular: No hyperdense vessel or unexpected calcification. Skull: Normal. Negative for fracture or focal lesion. Sinuses/Orbits: No acute finding. Other: None IMPRESSION: No CT evidence for acute intracranial abnormality. Electronically Signed   By: Donavan Foil M.D.   On: 02/01/2017 00:53   Mr Brain Wo Contrast  Result Date: 02/01/2017 CLINICAL DATA:  Right-sided weakness, blurred vision and gait disturbance with tingling of the right arm. Symptoms began suddenly 2 days ago. EXAM: MRI HEAD WITHOUT CONTRAST MRA HEAD WITHOUT CONTRAST TECHNIQUE: Multiplanar, multiecho pulse sequences of the brain and surrounding structures were obtained without intravenous contrast. Angiographic images of the head were obtained using MRA technique without contrast. COMPARISON:  CT and ultrasound studies same day  FINDINGS: MRI HEAD FINDINGS Brain: Diffusion imaging does not show any acute or subacute infarction. The brainstem and cerebellum are normal. Cerebral hemispheres do not show atrophic changes or widespread small-vessel changes. There is a 7 mm cystic area with adjacent gliosis at the medial right posterior parietal vertex. This is consistent with a benign cyst of indeterminate etiology. Acute be due to old inflammatory disease. The likelihood of a a neoplastic cyst is quite low, but I would suggest follow-up scan in 6 months to show that this is stable. That could be done with and without contrast. No hydrocephalus or extra-axial collection. No sign of brain hemorrhage. Vascular: Major vessels at the base of the brain show flow. Skull and upper cervical spine: Negative Sinuses/Orbits: Clear/normal Other: None significant MRA HEAD FINDINGS Both internal carotid arteries are widely patent into the brain. The anterior and middle cerebral vessels are normal without proximal stenosis, aneurysm or vascular malformation. Both vertebral arteries are widely patent to the basilar. No basilar stenosis. Posterior circulation branch vessels are normal. IMPRESSION: No acute finding. Normal brain with exception of a 7 mm cyst in the medial right posterior parietal vertex with adjacent gliosis. Etiology is indeterminate, but a would not seem to relate to the clinical presentation. 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 Neoplastic cyst is quite unlikely. However, follow-up in 6 months with an without contrast recommended to show stability. Normal intracranial MR angiography of the large and medium size vessels. Electronically Signed   By: Nelson Chimes M.D.   On: 02/01/2017 13:43   US Carotid Bilateral (at [facility] Only)  Result Date: 02/01/2017 CLINICAL DATA:  Numbness EXAM: BILATERAL CAROTID DUPLEX ULTRASOUND TECHNIQUE: Pearline Cables scale imaging, color Doppler and duplex ultrasound were performed of bilateral carotid and vertebral  arteries in the neck. COMPARISON:  None. FINDINGS: Criteria: Quantification of carotid stenosis is based on velocity parameters that correlate the residual internal carotid diameter with NASCET-based stenosis levels, using the diameter of the distal internal carotid lumen as the denominator for stenosis measurement. The following velocity measurements were obtained: RIGHT ICA:  161 cm/sec CCA:  818 cm/sec SYSTOLIC ICA/CCA RATIO:  0.8 DIASTOLIC ICA/CCA RATIO:  1 ECA:  138 cm/sec LEFT ICA:  106 cm/sec CCA:  563 cm/sec SYSTOLIC ICA/CCA RATIO:  0.5 DIASTOLIC ICA/CCA RATIO:  1 ECA:  91 cm/sec RIGHT CAROTID ARTERY: Minimal calcified plaque in the bulb. Low resistance internal carotid Doppler pattern. RIGHT VERTEBRAL ARTERY:  Antegrade. LEFT CAROTID ARTERY: Minimal calcified plaque in the bulb. Low resistance internal carotid Doppler pattern. LEFT VERTEBRAL ARTERY:  Antegrade. IMPRESSION: Less than 50% stenosis in the right and left internal carotid arteries. Electronically  Signed   By: Marybelle Killings M.D.   On: 02/01/2017 13:12   Mr Jodene Nam Head/brain ZO Cm  Result Date: 02/01/2017 CLINICAL DATA:  Right-sided weakness, blurred vision and gait disturbance with tingling of the right arm. Symptoms began suddenly 2 days ago. EXAM: MRI HEAD WITHOUT CONTRAST MRA HEAD WITHOUT CONTRAST TECHNIQUE: Multiplanar, multiecho pulse sequences of the brain and surrounding structures were obtained without intravenous contrast. Angiographic images of the head were obtained using MRA technique without contrast. COMPARISON:  CT and ultrasound studies same day FINDINGS: MRI HEAD FINDINGS Brain: Diffusion imaging does not show any acute or subacute infarction. The brainstem and cerebellum are normal. Cerebral hemispheres do not show atrophic changes or widespread small-vessel changes. There is a 7 mm cystic area with adjacent gliosis at the medial right posterior parietal vertex. This is consistent with a benign cyst of indeterminate etiology.  Acute be due to old inflammatory disease. The likelihood of a a neoplastic cyst is quite low, but I would suggest follow-up scan in 6 months to show that this is stable. That could be done with and without contrast. No hydrocephalus or extra-axial collection. No sign of brain hemorrhage. Vascular: Major vessels at the base of the brain show flow. Skull and upper cervical spine: Negative Sinuses/Orbits: Clear/normal Other: None significant MRA HEAD FINDINGS Both internal carotid arteries are widely patent into the brain. The anterior and middle cerebral vessels are normal without proximal stenosis, aneurysm or vascular malformation. Both vertebral arteries are widely patent to the basilar. No basilar stenosis. Posterior circulation branch vessels are normal. IMPRESSION: No acute finding. Normal brain with exception of a 7 mm cyst in the medial right posterior parietal vertex with adjacent gliosis. Etiology is indeterminate, but a would not seem to relate to the clinical presentation. 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 Neoplastic cyst is quite unlikely. However, follow-up in 6 months with an without contrast recommended to show stability. Normal intracranial MR angiography of the large and medium size vessels. Electronically Signed   By: Nelson Chimes M.D.   On: 02/01/2017 13:43      Current Discharge Medication List    START taking these medications   Details  nystatin cream (MYCOSTATIN) Apply topically 2 (two) times daily. Qty: 30 g, Refills: 0      CONTINUE these medications which have CHANGED   Details  amLODipine (NORVASC) 5 MG tablet Take 1 tablet (5 mg total) by mouth daily. Qty: 30 tablet, Refills: 0    aspirin EC 81 MG tablet Take 1 tablet (81 mg total) by mouth daily.    cetirizine (ZYRTEC) 10 MG tablet Take 1 tablet (10 mg total) by mouth daily. Qty: 30 tablet, Refills: 0    DULoxetine (CYMBALTA) 30 MG capsule Take 1 capsule (30 mg total) by mouth daily. Qty: 30 capsule, Refills: 0     hydrochlorothiazide (HYDRODIURIL) 25 MG tablet Take 1 tablet (25 mg total) by mouth daily. Qty: 30 tablet, Refills: 0    insulin glargine (LANTUS) 100 UNIT/ML injection Inject 0.6 mLs (60 Units total) into the skin 2 (two) times daily. Qty: 10 mL, Refills: 0    lisinopril (PRINIVIL,ZESTRIL) 10 MG tablet Take 1 tablet (10 mg total) by mouth daily. Qty: 30 tablet, Refills: 0    LORazepam (ATIVAN) 0.5 MG tablet Take 1 tablet (0.5 mg total) by mouth daily. Qty: 10 tablet, Refills: 0    pregabalin (LYRICA) 100 MG capsule Take 1 capsule (100 mg total) by mouth 2 (two) times daily. Qty:  60 capsule, Refills: 0    terazosin (HYTRIN) 1 MG capsule Take 1 capsule (1 mg total) by mouth daily. Qty: 30 capsule, Refills: 0      CONTINUE these medications which have NOT CHANGED   Details  atorvastatin (LIPITOR) 20 MG tablet Take 20 mg by mouth daily at 6 PM.    cyclobenzaprine (FLEXERIL) 10 MG tablet Take 1 tablet (10 mg total) by mouth 3 (three) times daily as needed for muscle spasms. Qty: 30 tablet, Refills: 0    insulin aspart (NOVOLOG) 100 UNIT/ML FlexPen Inject 5-20 Units into the skin 3 (three) times daily with meals. Sliding scale    oxyCODONE-acetaminophen (ROXICET) 5-325 MG tablet Take 1 tablet by mouth every 6 (six) hours as needed. Qty: 9 tablet, Refills: 0    fluticasone (FLONASE) 50 MCG/ACT nasal spray Place 2 sprays into both nostrils daily. Qty: 9.9 g, Refills: 0      STOP taking these medications     ibuprofen (ADVIL,MOTRIN) 600 MG tablet      amoxicillin (AMOXIL) 500 MG capsule      amoxicillin (AMOXIL) 500 MG capsule      amoxicillin (AMOXIL) 500 MG tablet      sulfamethoxazole-trimethoprim (BACTRIM DS,SEPTRA DS) 800-160 MG per tablet             Management plans discussed with the patient and he is in agreement. Stable for discharge home  Patient should follow up with pcp  CODE STATUS:     Code Status Orders        Start     Ordered   02/01/17  0631  Full code  Continuous     02/01/17 0630    Code Status History    Date Active Date Inactive Code Status Order ID Comments User Context   07/04/2015 11:54 PM 07/06/2015 10:35 PM Full Code [id number]  Hillary Bow, MD ED    Advance Directive Documentation     Most Recent Value  Type of Advance Directive  Healthcare Power of [place], Living will  Pre-existing out of facility DNR order (yellow form or pink MOST form)  -  "MOST" Form in Place?  -      TOTAL TIME TAKING CARE OF THIS PATIENT: 37 minutes.    Note: This dictation was prepared with Dragon dictation along with smaller phrase technology. Any transcriptional errors that result from this process are unintentional.  Shameeka Silliman M.D on 02/02/2017 at 7:48 AM  Between 7am to 6pm - Pager - [phone] After 6pm go to [email or web address] - password [email or web address] [facility]  Office  [phone]  CC: Primary care physician; Juanell Fairly, MD

## 2017-02-02 NOTE — Care Management Note (Signed)
Case Management Note  Patient Details  Name: Kailash Hinze MRN: [id number] Date of Birth: [birth year]  Subjective/Objective:     Informed Mr Langham that Medicaid would not cover HH-PT. Provided him with the name and phone number of the free outpatient clinic at [facility]".                Action/Plan:   Expected Discharge Date:  02/02/17               Expected Discharge Plan:   02/02/17  In-House Referral:     Discharge planning Services   Referral to the [facility].   Post Acute Care Choice:    Choice offered to:     DME Arranged:    DME Agency:     HH Arranged:    HH Agency:     Status of Service:   Completed.   If discussed at [place] of Stay Meetings, dates discussed:    Additional Comments:  Zaire Vanbuskirk A, RN 02/02/2017, 12:48 PM

## 2017-02-03 LAB — BLOOD GAS, VENOUS
ACID-BASE DEFICIT: 3.3 mmol/L — AB (ref 0.0–2.0)
BICARBONATE: 22.1 mmol/L (ref 20.0–28.0)
O2 SAT: 92.3 %
Patient temperature: 37
pCO2, Ven: 40 mmHg — ABNORMAL LOW (ref 44.0–60.0)
pH, Ven: 7.35 (ref 7.250–7.430)
pO2, Ven: 68 mmHg — ABNORMAL HIGH (ref 32.0–45.0)

## 2017-02-03 LAB — HEMOGLOBIN A1C
Hgb A1c MFr Bld: >15.5 % — ABNORMAL HIGH (ref 4.8–5.6)
Mean Plasma Glucose: >398 mg/dL

## 2017-03-07 ENCOUNTER — Emergency Department
Admission: EM | Admit: 2017-03-07 | Discharge: 2017-03-07 | Disposition: A | Discharge status: Home / Self Care | Payer: Medicaid Other | Attending: Emergency Medicine | Admitting: Emergency Medicine

## 2017-03-07 ENCOUNTER — Encounter: Payer: Self-pay | Admitting: Emergency Medicine

## 2017-03-07 DIAGNOSIS — I10 Essential (primary) hypertension: Secondary | ICD-10-CM | POA: Insufficient documentation

## 2017-03-07 DIAGNOSIS — Z794 Long term (current) use of insulin: Secondary | ICD-10-CM | POA: Insufficient documentation

## 2017-03-07 DIAGNOSIS — Z8546 Personal history of malignant neoplasm of prostate: Secondary | ICD-10-CM | POA: Insufficient documentation

## 2017-03-07 DIAGNOSIS — Z79899 Other long term (current) drug therapy: Secondary | ICD-10-CM | POA: Diagnosis not present

## 2017-03-07 DIAGNOSIS — K0889 Other specified disorders of teeth and supporting structures: Secondary | ICD-10-CM | POA: Diagnosis present

## 2017-03-07 DIAGNOSIS — E1140 Type 2 diabetes mellitus with diabetic neuropathy, unspecified: Secondary | ICD-10-CM | POA: Insufficient documentation

## 2017-03-07 DIAGNOSIS — K1379 Other lesions of oral mucosa: Secondary | ICD-10-CM

## 2017-03-07 DIAGNOSIS — Z48814 Encounter for surgical aftercare following surgery on the teeth or oral cavity: Secondary | ICD-10-CM | POA: Diagnosis not present

## 2017-03-07 MED ORDER — OXYCODONE-ACETAMINOPHEN 5-325 MG PO TABS
1.0000 | ORAL_TABLET | Freq: Once | ORAL | Status: AC
  Administered 2017-03-07: 1 via ORAL
  Filled 2017-03-07: qty 1

## 2017-03-07 MED ORDER — AMOXICILLIN 500 MG PO CAPS: 500.0000 mg | ORAL_CAPSULE | Freq: Three times a day (TID) | ORAL | 0 refills | Status: DC

## 2017-03-07 MED ORDER — OXYCODONE-ACETAMINOPHEN 5-325 MG PO TABS: 1.0000 | ORAL_TABLET | ORAL | 0 refills | Status: AC | PRN

## 2017-03-07 NOTE — ED Triage Notes (Signed)
Presents with dental pain states he had wisdom tooth and another tooth pulled last week. But developed increased pain on weds

## 2017-03-07 NOTE — ED Provider Notes (Signed)
[facility] Emergency Department Provider Note  ____________________________________________  Time seen: Approximately 11:24 AM  I have reviewed the triage vital signs and the nursing notes.   HISTORY  Chief Complaint Dental Pain    HPI Christopher [name] is a 51 y.o. male that presents to the emergency department with jaw pain after having 2 teeth removed 7 days ago. Patient states that lower left incisor and lower left wisdom tooth were removed. He was taking amoxicillin before the procedure. He did not have any problems until 2 days ago. He tried to get back into the dentist today but they did not have any openings today or tomorrow. They gave him 10 Percocets for pain. He denies fever, drainage from mouth shortness breath, chest pain, nausea, vomiting, abdominal pain.   Past Medical History:  Diagnosis Date  . Anxiety   . Arthritis   . Cancer [facility])    Prostate  . Diabetes mellitus without complication ([place])   . Hyperlipidemia   . Hypertension   . Neuropathy   . Neuropathy   . Sleep apnea     Patient Active Problem List   Diagnosis Date Noted  . TIA (transient ischemic attack) 02/01/2017  . Cramps, muscle, general 07/04/2015    Past Surgical History:  Procedure Laterality Date  . APPENDECTOMY      Prior to Admission medications   Medication Sig Start Date End Date Taking? Authorizing Provider  amLODipine (NORVASC) 5 MG tablet Take 1 tablet (5 mg total) by mouth daily. 02/02/17   Bettey Costa, MD  amoxicillin (AMOXIL) 500 MG capsule Take 1 capsule (500 mg total) by mouth 3 (three) times daily. 03/07/17   Laban Emperor, PA-C  aspirin EC 81 MG tablet Take 1 tablet (81 mg total) by mouth daily. 02/02/17   Bettey Costa, MD  atorvastatin (LIPITOR) 20 MG tablet Take 20 mg by mouth daily at 6 PM.    [provider]  cetirizine (ZYRTEC) 10 MG tablet Take 1 tablet (10 mg total) by mouth daily. 02/02/17   Bettey Costa, MD  cyclobenzaprine (FLEXERIL) 10  MG tablet Take 1 tablet (10 mg total) by mouth 3 (three) times daily as needed for muscle spasms. 07/06/15   Demetrios Loll, MD  DULoxetine (CYMBALTA) 30 MG capsule Take 1 capsule (30 mg total) by mouth daily. 02/02/17   Bettey Costa, MD  fluticasone (FLONASE) 50 MCG/ACT nasal spray Place 2 sprays into both nostrils daily. 01/10/15 01/10/16  Johnn Hai, PA-C  hydrochlorothiazide (HYDRODIURIL) 25 MG tablet Take 1 tablet (25 mg total) by mouth daily. 02/02/17   Bettey Costa, MD  insulin aspart (NOVOLOG) 100 UNIT/ML FlexPen Inject 5-20 Units into the skin 3 (three) times daily with meals. Sliding scale 01/22/14   [provider]  insulin glargine (LANTUS) 100 UNIT/ML injection Inject 0.6 mLs (60 Units total) into the skin 2 (two) times daily. 02/02/17 03/04/17  Bettey Costa, MD  lisinopril (PRINIVIL,ZESTRIL) 10 MG tablet Take 1 tablet (10 mg total) by mouth daily. 02/02/17   Bettey Costa, MD  LORazepam (ATIVAN) 0.5 MG tablet Take 1 tablet (0.5 mg total) by mouth daily. 02/02/17   Bettey Costa, MD  nystatin cream (MYCOSTATIN) Apply topically 2 (two) times daily. 02/02/17   Bettey Costa, MD  oxyCODONE-acetaminophen (ROXICET) 5-325 MG tablet Take 1 tablet by mouth every 4 (four) hours as needed for severe pain. 03/07/17 03/09/17  Laban Emperor, PA-C  pregabalin (LYRICA) 100 MG capsule Take 1 capsule (100 mg total) by mouth 2 (two) times  daily. 02/02/17   Bettey Costa, MD  terazosin (HYTRIN) 1 MG capsule Take 1 capsule (1 mg total) by mouth daily. 02/02/17   Bettey Costa, MD    Allergies Tramadol  Family History  Problem Relation Age of Onset  . Cancer Father   . Stroke Father     Social History Social History  Substance Use Topics  . Smoking status: Never Smoker  . Smokeless tobacco: Never Used  . Alcohol use No     Review of Systems  Constitutional: No fever/chills Cardiovascular: No chest pain. Respiratory:  No SOB. Gastrointestinal: No abdominal pain.  No nausea, no vomiting.  Skin: Negative  for rash, abrasions, lacerations, ecchymosis. Neurological: Negative for headaches, numbness or tingling   ____________________________________________   PHYSICAL EXAM:  VITAL SIGNS: ED Triage Vitals  Enc Vitals Group     BP 03/07/17 1106 132/67     Pulse Rate 03/07/17 1106 86     Resp 03/07/17 1106 20     Temp 03/07/17 1106 98.4 F (36.9 C)     Temp Source 03/07/17 1106 Oral     SpO2 03/07/17 1106 99 %     Weight 03/07/17 1107 204 lb (92.5 kg)     Height 03/07/17 1107 5\' 9"  (1.753 m)     Head Circumference --      Peak Flow --      Pain Score 03/07/17 1111 6     Pain Loc --      Pain Edu? --      Excl. in [place]? --      Constitutional: Alert and oriented. Well appearing and in no acute distress. Eyes: Conjunctivae are normal. PERRL. EOMI. Head: Atraumatic. ENT:      Ears:      Nose: No congestion/rhinnorhea.      Mouth/Throat: Mucous membranes are moist. Lower left canine and lower left wisdom tooth removed. Tenderness to palpation around lower left gums. No drainage noted. No visible swelling. No TMJ pain. Maintaining secretions. Neck: No stridor.  Cardiovascular: Normal rate, regular rhythm.  Good peripheral circulation. Respiratory: Normal respiratory effort without tachypnea or retractions. Lungs CTAB. Good air entry to the bases with no decreased or absent breath sounds. Musculoskeletal: Full range of motion to all extremities. No gross deformities appreciated. Neurologic:  Normal speech and language. No gross focal neurologic deficits are appreciated.  Skin:  Skin is warm, dry and intact. No rash noted.    ____________________________________________   LABS (all labs ordered are listed, but only abnormal results are displayed)  Labs Reviewed - No data to display ____________________________________________  EKG   ____________________________________________  RADIOLOGY   No results  found.  ____________________________________________    PROCEDURES  Procedure(s) performed:    Procedures    Medications  oxyCODONE-acetaminophen (PERCOCET/ROXICET) 5-325 MG per tablet 1 tablet (1 tablet Oral Given 03/07/17 1136)     ____________________________________________   INITIAL IMPRESSION / ASSESSMENT AND PLAN / ED COURSE  Pertinent labs & imaging results that were available during my care of the patient were reviewed by me and considered in my medical decision making (see chart for details).  Review of the  [place] was performed in accordance of the [place] prior to dispensing any controlled drugs.    Presented to the emergency department for evaluation of dental pain. Vital signs and exam are reassuring. No drainage or visible swelling. He had 2 teeth removed 7 days ago. Patient will be discharged home with prescriptions for amoxicillin and percocet. Patient is to follow up  with dentist as directed. Patient is given ED precautions to return to the ED for any worsening or new symptoms.     ____________________________________________  FINAL CLINICAL IMPRESSION(S) / ED DIAGNOSES  Final diagnoses:  Mouth pain      NEW MEDICATIONS STARTED DURING THIS VISIT:  Discharge Medication List as of 03/07/2017 11:34 AM    START taking these medications   Details  amoxicillin (AMOXIL) 500 MG capsule Take 1 capsule (500 mg total) by mouth 3 (three) times daily., Starting Thu 03/07/2017, Print            This chart was dictated using voice recognition software/Dragon. Despite best efforts to proofread, errors can occur which can change the meaning. Any change was purely unintentional.    Laban Emperor, PA-C 03/07/17 1241    Earleen Newport, MD 03/07/17 [phone]

## 2017-04-09 ENCOUNTER — Emergency Department
Admission: EM | Admit: 2017-04-09 | Discharge: 2017-04-09 | Disposition: A | Discharge status: Home / Self Care | Payer: Medicaid Other | Attending: Emergency Medicine | Admitting: Emergency Medicine

## 2017-04-09 ENCOUNTER — Encounter: Payer: Self-pay | Admitting: *Deleted

## 2017-04-09 DIAGNOSIS — E1140 Type 2 diabetes mellitus with diabetic neuropathy, unspecified: Secondary | ICD-10-CM | POA: Insufficient documentation

## 2017-04-09 DIAGNOSIS — Z8673 Personal history of transient ischemic attack (TIA), and cerebral infarction without residual deficits: Secondary | ICD-10-CM | POA: Insufficient documentation

## 2017-04-09 DIAGNOSIS — Z7982 Long term (current) use of aspirin: Secondary | ICD-10-CM | POA: Insufficient documentation

## 2017-04-09 DIAGNOSIS — Z79899 Other long term (current) drug therapy: Secondary | ICD-10-CM | POA: Insufficient documentation

## 2017-04-09 DIAGNOSIS — Z794 Long term (current) use of insulin: Secondary | ICD-10-CM | POA: Diagnosis not present

## 2017-04-09 DIAGNOSIS — E119 Type 2 diabetes mellitus without complications: Secondary | ICD-10-CM | POA: Diagnosis not present

## 2017-04-09 DIAGNOSIS — Z8546 Personal history of malignant neoplasm of prostate: Secondary | ICD-10-CM | POA: Insufficient documentation

## 2017-04-09 DIAGNOSIS — I10 Essential (primary) hypertension: Secondary | ICD-10-CM | POA: Insufficient documentation

## 2017-04-09 DIAGNOSIS — L02214 Cutaneous abscess of groin: Secondary | ICD-10-CM | POA: Insufficient documentation

## 2017-04-09 DIAGNOSIS — L0291 Cutaneous abscess, unspecified: Secondary | ICD-10-CM

## 2017-04-09 MED ORDER — OXYCODONE-ACETAMINOPHEN 5-325 MG PO TABS
1.0000 | ORAL_TABLET | Freq: Once | ORAL | Status: AC
  Administered 2017-04-09: 1 via ORAL
  Filled 2017-04-09: qty 1

## 2017-04-09 MED ORDER — CLINDAMYCIN HCL 300 MG PO CAPS: 300.0000 mg | ORAL_CAPSULE | Freq: Four times a day (QID) | ORAL | 0 refills | Status: AC

## 2017-04-09 MED ORDER — OXYCODONE-ACETAMINOPHEN 5-325 MG PO TABS: 1.0000 | ORAL_TABLET | Freq: Four times a day (QID) | ORAL | 0 refills | Status: AC | PRN

## 2017-04-09 MED ORDER — LIDOCAINE HCL (PF) 1 % IJ SOLN
5.0000 mL | Freq: Once | INTRAMUSCULAR | Status: AC
  Administered 2017-04-09: 5 mL
  Filled 2017-04-09: qty 5

## 2017-04-09 NOTE — ED Triage Notes (Signed)
Per patient's report, patient noticed a small red bump on Saturday and advanced since then. Patient took Amoxicillin that were left over from a dental procedure and 1/2 tablet of Percocet today. Abscess is open on right lower abdomen/groin.

## 2017-04-09 NOTE — Discharge Instructions (Signed)
Return to your primary care provider on the emergency department in 2 days for wound recheck.  Keep the wound clean dry and covered but do not apply a dressing that seals the wound off from air. If the packing comes out before 2 days return to have the packing reapplied.  Complete the course of your a as directed.

## 2017-04-09 NOTE — ED Provider Notes (Signed)
[facility] Emergency Department Provider Note   ____________________________________________   I have reviewed the triage vital signs and the nursing notes.   HISTORY  Chief Complaint Abscess    HPI Christopher [name] is a 51 y.o. male resents to emergency department with right groin abscess that developed on Saturday. Patient reported the wound initially began as a small red bump. Patient noted after working outdoors he believes the perspiration and his jeans rubbing the wound area cause more irritation. Today, the wound area is more erythematous and edematous with induration. The skin appears abraised with a small area of drainage noted to be purulent. Patient denies fever, chills, headache, vision changes, chest pain, chest tightness, shortness of breath, abdominal pain, nausea and vomiting.  Past Medical History:  Diagnosis Date  . Anxiety   . Arthritis   . Cancer [facility])    Prostate  . Diabetes mellitus without complication ([place])   . Hyperlipidemia   . Hypertension   . Neuropathy   . Neuropathy   . Sleep apnea     Patient Active Problem List   Diagnosis Date Noted  . TIA (transient ischemic attack) 02/01/2017  . Cramps, muscle, general 07/04/2015    Past Surgical History:  Procedure Laterality Date  . APPENDECTOMY    . PROSTATECTOMY      Prior to Admission medications   Medication Sig Start Date End Date Taking? Authorizing Provider  amLODipine (NORVASC) 5 MG tablet Take 1 tablet (5 mg total) by mouth daily. 02/02/17   Bettey Costa, MD  amoxicillin (AMOXIL) 500 MG capsule Take 1 capsule (500 mg total) by mouth 3 (three) times daily. 03/07/17   Laban Emperor, PA-C  aspirin EC 81 MG tablet Take 1 tablet (81 mg total) by mouth daily. 02/02/17   Bettey Costa, MD  atorvastatin (LIPITOR) 20 MG tablet Take 20 mg by mouth daily at 6 PM.    [provider]  cetirizine (ZYRTEC) 10 MG tablet Take 1 tablet (10 mg total) by mouth daily. 02/02/17    Bettey Costa, MD  clindamycin (CLEOCIN) 300 MG capsule Take 1 capsule (300 mg total) by mouth 4 (four) times daily. 04/09/17 04/19/17  Eutimio Gharibian M, PA-C  cyclobenzaprine (FLEXERIL) 10 MG tablet Take 1 tablet (10 mg total) by mouth 3 (three) times daily as needed for muscle spasms. 07/06/15   Demetrios Loll, MD  DULoxetine (CYMBALTA) 30 MG capsule Take 1 capsule (30 mg total) by mouth daily. 02/02/17   Bettey Costa, MD  fluticasone (FLONASE) 50 MCG/ACT nasal spray Place 2 sprays into both nostrils daily. 01/10/15 01/10/16  Johnn Hai, PA-C  hydrochlorothiazide (HYDRODIURIL) 25 MG tablet Take 1 tablet (25 mg total) by mouth daily. 02/02/17   Bettey Costa, MD  insulin aspart (NOVOLOG) 100 UNIT/ML FlexPen Inject 5-20 Units into the skin 3 (three) times daily with meals. Sliding scale 01/22/14   [provider]  insulin glargine (LANTUS) 100 UNIT/ML injection Inject 0.6 mLs (60 Units total) into the skin 2 (two) times daily. 02/02/17 03/04/17  Bettey Costa, MD  lisinopril (PRINIVIL,ZESTRIL) 10 MG tablet Take 1 tablet (10 mg total) by mouth daily. 02/02/17   Bettey Costa, MD  LORazepam (ATIVAN) 0.5 MG tablet Take 1 tablet (0.5 mg total) by mouth daily. 02/02/17   Bettey Costa, MD  nystatin cream (MYCOSTATIN) Apply topically 2 (two) times daily. 02/02/17   Bettey Costa, MD  oxyCODONE-acetaminophen (ROXICET) 5-325 MG tablet Take 1 tablet by mouth every 6 (six) hours as needed. 04/09/17 04/09/18  Alyssa Rotondo M, PA-C  pregabalin (LYRICA) 100 MG capsule Take 1 capsule (100 mg total) by mouth 2 (two) times daily. 02/02/17   Bettey Costa, MD  terazosin (HYTRIN) 1 MG capsule Take 1 capsule (1 mg total) by mouth daily. 02/02/17   Bettey Costa, MD    Allergies Tramadol  Family History  Problem Relation Age of Onset  . Cancer Father   . Stroke Father     Social History Social History  Substance Use Topics  . Smoking status: Never Smoker  . Smokeless tobacco: Never Used  . Alcohol use No    Review of  Systems Constitutional: Negative for fever/chills Eyes: No visual changes. ENT:  Negative for sore throat and for difficulty swallowing Cardiovascular: Denies chest pain. Respiratory: Denies cough. Denies shortness of breath. Gastrointestinal: No abdominal pain.  No nausea, vomiting, diarrhea. Genitourinary: Negative for dysuria. Musculoskeletal: Negative for back pain. Skin: Negative for rash. Abscess along right groin.  Neurological: Negative for headaches.  Negative focal weakness or numbness. Negative for loss of consciousness. Able to ambulate. ____________________________________________   PHYSICAL EXAM:  VITAL SIGNS: ED Triage Vitals  Enc Vitals Group     BP 04/09/17 1248 (!) 152/81     Pulse Rate 04/09/17 1248 63     Resp 04/09/17 1248 18     Temp 04/09/17 1248 98 F (36.7 C)     Temp Source 04/09/17 1248 Oral     SpO2 04/09/17 1248 99 %     Weight 04/09/17 1249 210 lb (95.3 kg)     Height 04/09/17 1249 5\' 9"  (1.753 m)     Head Circumference --      Peak Flow --      Pain Score 04/09/17 1248 9     Pain Loc --      Pain Edu? --      Excl. in [place]? --     Constitutional: Alert and oriented. Well appearing and in no acute distress.  Eyes: Conjunctivae are normal. PERRL. EOMI  Head: Normocephalic and atraumatic. ENT: Ears:Canals clear. TMs intact bilaterally. Nose: No congestion/rhinnorhea. Mouth/Throat: Mucous membranes are moist. Neck:Supple. No thyromegaly. No stridor. Cardiovascular: Normal rate, regular rhythm. Normal S1 and S2. Good peripheral circulation. Respiratory: Normal respiratory effort without tachypnea or retractions. Lungs CTAB. No wheezes/rales/rhonchi. Good air entry to the bases with no decreased or absent breath sounds. Hematological/Lymphatic/Immunological: No cervical lymphadenopathy. Cardiovascular: Normal rate, regular rhythm. Normal distal pulses. Gastrointestinal: Bowel sounds 4 quadrants. Soft and nontender to palpation.  No guarding or rigidity. No palpable masses. No distention. No CVA tenderness. Musculoskeletal: Nontender with normal range of motion in all extremities. Neurologic: Normal speech and language. Skin:  Skin is warm, dry and intact. No rash noted. Abscess along right groin with induration ~3.5 x 3.5 cm and an area of fluctuance ~1.0 x 1.0 cm. Erythema noted along the peri-wound.  Psychiatric:Mood and affect are normal. Speech and behavior are normal. Patient exhibits appropriate insight and judgement. ____________________________________________   LABS (all labs ordered are listed, but only abnormal results are displayed)  Labs Reviewed - No data to display ____________________________________________  EKG none ____________________________________________  RADIOLOGY none ____________________________________________   PROCEDURES  Procedure(s) performed: INCISION AND DRAINAGE Performed by: Jerolyn Shin Consent: Verbal consent obtained. Risks and benefits: risks, benefits and alternatives were discussed Type: abscess  Body area: Right groin  Anesthesia: local infiltration  Incision was made with a scalpel.  Local anesthetic: lidocaine 1%  Anesthetic total: 4 ml  Complexity: complex Blunt dissection  to break up loculations  Drainage: purulent  Drainage amount: 5.0 ml  Packing material: 1/4 in iodoform gauze  Patient tolerance: Patient tolerated the procedure well with no immediate complications.      Critical Care performed: no ____________________________________________   INITIAL IMPRESSION / ASSESSMENT AND PLAN / ED COURSE  Pertinent labs & imaging results that were available during my care of the patient were reviewed by me and considered in my medical decision making (see chart for details).   Patient presented with abscess located right groin. The abscess I&D without complication. Patient will be prescribed clindamycin for antibiotic coverage and  tramadol as needed. Patient instructed to keep wound clean, dry and covered with a nonocclusive dressing. Also instructed to return in 2 days for a wound check. Patient informed of clinical course, understand medical decision-making process, and agree with plan. Patient was advised to follow up with PCP as needed and was also advised to return to the emergency department for symptoms that change or worsen.       FINAL CLINICAL IMPRESSION(S) / ED DIAGNOSES  Final diagnoses:  Abscess       NEW MEDICATIONS STARTED DURING THIS VISIT:  Discharge Medication List as of 04/09/2017  2:30 PM    START taking these medications   Details  clindamycin (CLEOCIN) 300 MG capsule Take 1 capsule (300 mg total) by mouth 4 (four) times daily., Starting Tue 04/09/2017, Until Fri 04/19/2017, Print    oxyCODONE-acetaminophen (ROXICET) 5-325 MG tablet Take 1 tablet by mouth every 6 (six) hours as needed., Starting Tue 04/09/2017, Until Wed 04/09/2018, Print         Note:  This document was prepared using Dragon voice recognition software and may include unintentional dictation errors.    Jerolyn Shin, PA-C 04/09/17 1605    Earleen Newport, MD 04/10/17 [phone]

## 2017-04-11 ENCOUNTER — Emergency Department
Admission: EM | Admit: 2017-04-11 | Discharge: 2017-04-11 | Disposition: A | Discharge status: Home / Self Care | Payer: Medicaid Other | Attending: Emergency Medicine | Admitting: Emergency Medicine

## 2017-04-11 DIAGNOSIS — Z794 Long term (current) use of insulin: Secondary | ICD-10-CM | POA: Diagnosis not present

## 2017-04-11 DIAGNOSIS — E119 Type 2 diabetes mellitus without complications: Secondary | ICD-10-CM | POA: Diagnosis not present

## 2017-04-11 DIAGNOSIS — Z7982 Long term (current) use of aspirin: Secondary | ICD-10-CM | POA: Insufficient documentation

## 2017-04-11 DIAGNOSIS — Z4801 Encounter for change or removal of surgical wound dressing: Secondary | ICD-10-CM | POA: Diagnosis not present

## 2017-04-11 DIAGNOSIS — Z79899 Other long term (current) drug therapy: Secondary | ICD-10-CM | POA: Diagnosis not present

## 2017-04-11 DIAGNOSIS — Z5189 Encounter for other specified aftercare: Secondary | ICD-10-CM

## 2017-04-11 DIAGNOSIS — I10 Essential (primary) hypertension: Secondary | ICD-10-CM | POA: Insufficient documentation

## 2017-04-11 MED ORDER — OXYCODONE-ACETAMINOPHEN 5-325 MG PO TABS: 1.0000 | ORAL_TABLET | Freq: Four times a day (QID) | ORAL | 0 refills | Status: AC | PRN

## 2017-04-11 NOTE — ED Provider Notes (Signed)
[facility] Emergency Department Provider Note   ____________________________________________   First MD Initiated Contact with Patient 04/11/17 [phone]     (approximate)  I have reviewed the triage vital signs and the nursing notes.   HISTORY  Chief Complaint Wound Check    HPI Christopher [name] is a 51 y.o. male patient returns for wound check secondary to I&D of the right groin area 2 days ago. Patient state the packing material came out last night doing dressing change.Patient is rating his pain as a 9/10. Patient describes pain as "sharp". Patient stated pain is relieved with medications. Patient did not take medication prior to arrival.  Past Medical History:  Diagnosis Date  . Anxiety   . Arthritis   . Cancer [facility])    Prostate  . Diabetes mellitus without complication ([place])   . Hyperlipidemia   . Hypertension   . Neuropathy   . Neuropathy   . Sleep apnea     Patient Active Problem List   Diagnosis Date Noted  . TIA (transient ischemic attack) 02/01/2017  . Cramps, muscle, general 07/04/2015    Past Surgical History:  Procedure Laterality Date  . APPENDECTOMY    . PROSTATECTOMY      Prior to Admission medications   Medication Sig Start Date End Date Taking? Authorizing Provider  amLODipine (NORVASC) 5 MG tablet Take 1 tablet (5 mg total) by mouth daily. 02/02/17   Bettey Costa, MD  amoxicillin (AMOXIL) 500 MG capsule Take 1 capsule (500 mg total) by mouth 3 (three) times daily. 03/07/17   Laban Emperor, PA-C  aspirin EC 81 MG tablet Take 1 tablet (81 mg total) by mouth daily. 02/02/17   Bettey Costa, MD  atorvastatin (LIPITOR) 20 MG tablet Take 20 mg by mouth daily at 6 PM.    [provider]  cetirizine (ZYRTEC) 10 MG tablet Take 1 tablet (10 mg total) by mouth daily. 02/02/17   Bettey Costa, MD  clindamycin (CLEOCIN) 300 MG capsule Take 1 capsule (300 mg total) by mouth 4 (four) times daily. 04/09/17 04/19/17  Little, Traci M, PA-C    cyclobenzaprine (FLEXERIL) 10 MG tablet Take 1 tablet (10 mg total) by mouth 3 (three) times daily as needed for muscle spasms. 07/06/15   Demetrios Loll, MD  DULoxetine (CYMBALTA) 30 MG capsule Take 1 capsule (30 mg total) by mouth daily. 02/02/17   Bettey Costa, MD  fluticasone (FLONASE) 50 MCG/ACT nasal spray Place 2 sprays into both nostrils daily. 01/10/15 01/10/16  Johnn Hai, PA-C  hydrochlorothiazide (HYDRODIURIL) 25 MG tablet Take 1 tablet (25 mg total) by mouth daily. 02/02/17   Bettey Costa, MD  insulin aspart (NOVOLOG) 100 UNIT/ML FlexPen Inject 5-20 Units into the skin 3 (three) times daily with meals. Sliding scale 01/22/14   [provider]  insulin glargine (LANTUS) 100 UNIT/ML injection Inject 0.6 mLs (60 Units total) into the skin 2 (two) times daily. 02/02/17 03/04/17  Bettey Costa, MD  lisinopril (PRINIVIL,ZESTRIL) 10 MG tablet Take 1 tablet (10 mg total) by mouth daily. 02/02/17   Bettey Costa, MD  LORazepam (ATIVAN) 0.5 MG tablet Take 1 tablet (0.5 mg total) by mouth daily. 02/02/17   Bettey Costa, MD  nystatin cream (MYCOSTATIN) Apply topically 2 (two) times daily. 02/02/17   Bettey Costa, MD  oxyCODONE-acetaminophen (ROXICET) 5-325 MG tablet Take 1 tablet by mouth every 6 (six) hours as needed. 04/09/17 04/09/18  Little, Traci M, PA-C  oxyCODONE-acetaminophen (ROXICET) 5-325 MG tablet Take 1 tablet by  mouth every 6 (six) hours as needed. 04/11/17 04/11/18  Sable Feil, PA-C  pregabalin (LYRICA) 100 MG capsule Take 1 capsule (100 mg total) by mouth 2 (two) times daily. 02/02/17   Bettey Costa, MD  terazosin (HYTRIN) 1 MG capsule Take 1 capsule (1 mg total) by mouth daily. 02/02/17   Bettey Costa, MD    Allergies Tramadol  Family History  Problem Relation Age of Onset  . Cancer Father   . Stroke Father     Social History Social History  Substance Use Topics  . Smoking status: Never Smoker  . Smokeless tobacco: Never Used  . Alcohol use No    Review of  Systems  Constitutional: No fever/chills Eyes: No visual changes. ENT: No sore throat. Cardiovascular: Denies chest pain. Respiratory: Denies shortness of breath. Gastrointestinal: No abdominal pain.  No nausea, no vomiting.  No diarrhea.  No constipation. Genitourinary: Negative for dysuria. Musculoskeletal: Negative for back pain. Skin: Negative for rash. Neurological: Negative for headaches, focal weakness or numbness. Neuropathy. Psychiatric:Anxiety Endocrine:Diabetes hyperlipidemia and hypertension. Allergic/Immunilogical: Tramadol ____________________________________________   PHYSICAL EXAM:  VITAL SIGNS: ED Triage Vitals  Enc Vitals Group     BP 04/11/17 0941 131/82     Pulse Rate 04/11/17 0941 (!) 55     Resp 04/11/17 0941 18     Temp 04/11/17 0941 (!) 97.5 F (36.4 C)     Temp Source 04/11/17 0941 Oral     SpO2 04/11/17 0941 100 %     Weight 04/11/17 0940 210 lb (95.3 kg)     Height 04/11/17 0940 5\' 9"  (1.753 m)     Head Circumference --      Peak Flow --      Pain Score 04/11/17 0939 9     Pain Loc --      Pain Edu? --      Excl. in [place]? --     Constitutional: Alert and oriented. Well appearing and in no acute distress. Cardiovascular: Normal rate, regular rhythm. Grossly normal heart sounds.  Good peripheral circulation. Respiratory: Normal respiratory effort.  No retractions. Lungs CTAB. Gastrointestinal: Soft and nontender. No distention. No abdominal bruits. No CVA tenderness. Musculoskeletal: No lower extremity tenderness nor edema.  No joint effusions. Neurologic:  Normal speech and language. No gross focal neurologic deficits are appreciated. No gait instability. Skin:  Skin is warm, dry and intact. No rash noted. Incision right inguinal area shows no increase induration or drainage at this time. Moderate guarding palpation. Psychiatric: Mood and affect are normal. Speech and behavior are normal.  ____________________________________________    LABS (all labs ordered are listed, but only abnormal results are displayed)  Labs Reviewed - No data to display ____________________________________________  EKG   ____________________________________________  RADIOLOGY  No results found.  ____________________________________________   PROCEDURES  Procedure(s) performed: None  Procedures  Critical Care performed: No  ____________________________________________   INITIAL IMPRESSION / ASSESSMENT AND PLAN / ED COURSE  Pertinent labs & imaging results that were available during my care of the patient were reviewed by me and considered in my medical decision making (see chart for details).  Wound check status post I&D 2 days ago. Packing material was absent. Area was cleaned and bandaged. Patient given discharge care instructions. Patient may now follow-up with PCP for continual care.    ____________________________________________   FINAL CLINICAL IMPRESSION(S) / ED DIAGNOSES  Final diagnoses:  Wound check, abscess      NEW MEDICATIONS STARTED DURING THIS VISIT:  New Prescriptions  OXYCODONE-ACETAMINOPHEN (ROXICET) 5-325 MG TABLET    Take 1 tablet by mouth every 6 (six) hours as needed.     Note:  This document was prepared using Dragon voice recognition software and may include unintentional dictation errors.    Sable Feil, PA-C 04/11/17 [place], [place], MD 04/14/17 2330

## 2017-04-11 NOTE — ED Notes (Signed)
See triage note  States he had abscess area lanced on Tuesday and the wound was packed at that time  States packing came out yesterday  Area is red and tender to touch

## 2017-04-11 NOTE — ED Triage Notes (Signed)
Pt is here for a wound check of the right groin area . States the packing came out last night.

## 2017-07-20 ENCOUNTER — Emergency Department
Admission: EM | Admit: 2017-07-20 | Discharge: 2017-07-20 | Disposition: A | Discharge status: Home / Self Care | Payer: Medicaid Other | Attending: Emergency Medicine | Admitting: Emergency Medicine

## 2017-07-20 ENCOUNTER — Other Ambulatory Visit: Payer: Self-pay

## 2017-07-20 DIAGNOSIS — Z8546 Personal history of malignant neoplasm of prostate: Secondary | ICD-10-CM | POA: Diagnosis not present

## 2017-07-20 DIAGNOSIS — E119 Type 2 diabetes mellitus without complications: Secondary | ICD-10-CM | POA: Insufficient documentation

## 2017-07-20 DIAGNOSIS — Z7982 Long term (current) use of aspirin: Secondary | ICD-10-CM | POA: Insufficient documentation

## 2017-07-20 DIAGNOSIS — G609 Hereditary and idiopathic neuropathy, unspecified: Secondary | ICD-10-CM | POA: Insufficient documentation

## 2017-07-20 DIAGNOSIS — Z794 Long term (current) use of insulin: Secondary | ICD-10-CM | POA: Insufficient documentation

## 2017-07-20 DIAGNOSIS — Z79899 Other long term (current) drug therapy: Secondary | ICD-10-CM | POA: Insufficient documentation

## 2017-07-20 DIAGNOSIS — I10 Essential (primary) hypertension: Secondary | ICD-10-CM | POA: Diagnosis not present

## 2017-07-20 DIAGNOSIS — R252 Cramp and spasm: Secondary | ICD-10-CM | POA: Diagnosis present

## 2017-07-20 LAB — CBC
HEMATOCRIT: 44.2 % (ref 40.0–52.0)
HEMOGLOBIN: 14.5 g/dL (ref 13.0–18.0)
MCH: 27.3 pg (ref 26.0–34.0)
MCHC: 32.9 g/dL (ref 32.0–36.0)
MCV: 82.8 fL (ref 80.0–100.0)
Platelets: 314 10*3/uL (ref 150–440)
RBC: 5.34 MIL/uL (ref 4.40–5.90)
RDW: 13.6 % (ref 11.5–14.5)
WBC: 9.4 10*3/uL (ref 3.8–10.6)

## 2017-07-20 LAB — COMPREHENSIVE METABOLIC PANEL
ALBUMIN: 4 g/dL (ref 3.5–5.0)
ALK PHOS: 53 U/L (ref 38–126)
ALT: 21 U/L (ref 17–63)
ANION GAP: 14 (ref 5–15)
AST: 39 U/L (ref 15–41)
BUN: 11 mg/dL (ref 6–20)
CALCIUM: 10.1 mg/dL (ref 8.9–10.3)
CHLORIDE: 102 mmol/L (ref 101–111)
CO2: 18 mmol/L — AB (ref 22–32)
Creatinine, Ser: 1.09 mg/dL (ref 0.61–1.24)
GFR calc Af Amer: >60 mL/min (ref >60)
GFR calc non Af Amer: >60 mL/min (ref >60)
GLUCOSE: 236 mg/dL — AB (ref 65–99)
POTASSIUM: 3.8 mmol/L (ref 3.5–5.1)
SODIUM: 134 mmol/L — AB (ref 135–145)
Total Bilirubin: 1 mg/dL (ref 0.3–1.2)
Total Protein: 7.3 g/dL (ref 6.5–8.1)

## 2017-07-20 LAB — TROPONIN I: Troponin I: <0.03 ng/mL (ref <0.03)

## 2017-07-20 LAB — CK: Total CK: 600 U/L — ABNORMAL HIGH (ref 49–397)

## 2017-07-20 MED ORDER — MORPHINE SULFATE (PF) 4 MG/ML IV SOLN
INTRAVENOUS | Status: AC
  Filled 2017-07-20: qty 1

## 2017-07-20 MED ORDER — ONDANSETRON HCL 4 MG/2ML IJ SOLN
4.0000 mg | Freq: Once | INTRAMUSCULAR | Status: AC
Start: 2017-07-20 — End: 2017-07-20
  Administered 2017-07-20: 4 mg via INTRAVENOUS

## 2017-07-20 MED ORDER — PREGABALIN 50 MG PO CAPS
ORAL_CAPSULE | ORAL | Status: AC
  Filled 2017-07-20: qty 1

## 2017-07-20 MED ORDER — ONDANSETRON HCL 4 MG/2ML IJ SOLN
INTRAMUSCULAR | Status: AC
  Filled 2017-07-20: qty 2

## 2017-07-20 MED ORDER — SODIUM CHLORIDE 0.9 % IV BOLUS (SEPSIS)
1000.0000 mL | Freq: Once | INTRAVENOUS | Status: AC
  Administered 2017-07-20: 1000 mL via INTRAVENOUS

## 2017-07-20 MED ORDER — MORPHINE SULFATE (PF) 4 MG/ML IV SOLN
4.0000 mg | Freq: Once | INTRAVENOUS | Status: AC
  Administered 2017-07-20: 4 mg via INTRAVENOUS

## 2017-07-20 MED ORDER — PREGABALIN 50 MG PO CAPS: 50.0000 mg | ORAL_CAPSULE | Freq: Three times a day (TID) | ORAL | 0 refills | Status: DC

## 2017-07-20 MED ORDER — PREGABALIN 50 MG PO CAPS
50.0000 mg | ORAL_CAPSULE | Freq: Once | ORAL | Status: AC
  Administered 2017-07-20: 50 mg via ORAL

## 2017-07-20 NOTE — ED Notes (Signed)
Pt complains of "hot feet". No change noted to feet from initial assessment. md notified. Pt appears in no acute distress.

## 2017-07-20 NOTE — ED Notes (Signed)
Patient's O2 sat were low 80's because patient was dozing off. Patient has hx of sleep apnea and so placed on 2L [place] and now sating 95%

## 2017-07-20 NOTE — ED Triage Notes (Addendum)
Pt arrives to ED via [facility] from home with c/o muscle cramps/spasms to bilateral anterior thighs, bilateral posterior calves, and bilateral hands. Pt reports s/x's started 2 hrs ago and reports h/x of the same "in early summer". Pt arrives A&O, moaning loudly. Pt states he was laying in bed "and stretched" when the cramps/spasms started.

## 2017-07-20 NOTE — ED Provider Notes (Signed)
[facility] Emergency Department Provider Note    First MD Initiated Contact with Patient 07/20/17 0404     (approximate)  I have reviewed the triage vital signs and the nursing notes.   HISTORY  Chief Complaint Spasms    HPI Christopher [name] is a 51 y.o. male with below list of chronic medical conditions presents to the emergency department bilateral lower extremity muscle cramps extending from thighs all the way down the calf.  Patient also admits to bilateral hand cramps.  Patient stated that symptoms began 2 hours before arrival to the emergency department.  Patient admits to previous history of the same.  Patient states his current pain score is 10 out of 10.  Past Medical History:  Diagnosis Date  . Anxiety   . Arthritis   . Cancer [facility])    Prostate  . Diabetes mellitus without complication ([place])   . Hyperlipidemia   . Hypertension   . Neuropathy   . Neuropathy   . Sleep apnea     Patient Active Problem List   Diagnosis Date Noted  . TIA (transient ischemic attack) 02/01/2017  . Cramps, muscle, general 07/04/2015    Past Surgical History:  Procedure Laterality Date  . APPENDECTOMY    . PROSTATECTOMY      Prior to Admission medications   Medication Sig Start Date End Date Taking? Authorizing Provider  amLODipine (NORVASC) 5 MG tablet Take 1 tablet (5 mg total) by mouth daily. 02/02/17   Bettey Costa, MD  amoxicillin (AMOXIL) 500 MG capsule Take 1 capsule (500 mg total) by mouth 3 (three) times daily. 03/07/17   Laban Emperor, PA-C  aspirin EC 81 MG tablet Take 1 tablet (81 mg total) by mouth daily. 02/02/17   Bettey Costa, MD  atorvastatin (LIPITOR) 20 MG tablet Take 20 mg by mouth daily at 6 PM.    [provider]  cetirizine (ZYRTEC) 10 MG tablet Take 1 tablet (10 mg total) by mouth daily. 02/02/17   Bettey Costa, MD  cyclobenzaprine (FLEXERIL) 10 MG tablet Take 1 tablet (10 mg total) by mouth 3 (three) times daily as needed for  muscle spasms. 07/06/15   Demetrios Loll, MD  DULoxetine (CYMBALTA) 30 MG capsule Take 1 capsule (30 mg total) by mouth daily. 02/02/17   Bettey Costa, MD  fluticasone (FLONASE) 50 MCG/ACT nasal spray Place 2 sprays into both nostrils daily. 01/10/15 01/10/16  Johnn Hai, PA-C  hydrochlorothiazide (HYDRODIURIL) 25 MG tablet Take 1 tablet (25 mg total) by mouth daily. 02/02/17   Bettey Costa, MD  insulin aspart (NOVOLOG) 100 UNIT/ML FlexPen Inject 5-20 Units into the skin 3 (three) times daily with meals. Sliding scale 01/22/14   [provider]  insulin glargine (LANTUS) 100 UNIT/ML injection Inject 0.6 mLs (60 Units total) into the skin 2 (two) times daily. 02/02/17 03/04/17  Bettey Costa, MD  lisinopril (PRINIVIL,ZESTRIL) 10 MG tablet Take 1 tablet (10 mg total) by mouth daily. 02/02/17   Bettey Costa, MD  LORazepam (ATIVAN) 0.5 MG tablet Take 1 tablet (0.5 mg total) by mouth daily. 02/02/17   Bettey Costa, MD  nystatin cream (MYCOSTATIN) Apply topically 2 (two) times daily. 02/02/17   Bettey Costa, MD  oxyCODONE-acetaminophen (ROXICET) 5-325 MG tablet Take 1 tablet by mouth every 6 (six) hours as needed. 04/09/17 04/09/18  Little, Traci M, PA-C  oxyCODONE-acetaminophen (ROXICET) 5-325 MG tablet Take 1 tablet by mouth every 6 (six) hours as needed. 04/11/17 04/11/18  Sable Feil, PA-C  pregabalin (LYRICA) 100 MG capsule Take 1 capsule (100 mg total) by mouth 2 (two) times daily. 02/02/17   Bettey Costa, MD  terazosin (HYTRIN) 1 MG capsule Take 1 capsule (1 mg total) by mouth daily. 02/02/17   Bettey Costa, MD    Allergies Tramadol  Family History  Problem Relation Age of Onset  . Cancer Father   . Stroke Father     Social History Social History   Tobacco Use  . Smoking status: Never Smoker  . Smokeless tobacco: Never Used  Substance Use Topics  . Alcohol use: No  . Drug use: No    Review of Systems Constitutional: No fever/chills Eyes: No visual changes. ENT: No sore  throat. Cardiovascular: Denies chest pain. Respiratory: Denies shortness of breath. Gastrointestinal: No abdominal pain.  No nausea, no vomiting.  No diarrhea.  No constipation. Genitourinary: Negative for dysuria. Musculoskeletal: Negative for neck pain.  Negative for back pain.  Positive for bilateral lower extremity and hand pain Integumentary: Negative for rash. Neurological: Negative for headaches, focal weakness or numbness.  ____________________________________________   PHYSICAL EXAM:  VITAL SIGNS: ED Triage Vitals  Enc Vitals Group     BP 07/20/17 0341 137/81     Pulse Rate 07/20/17 0341 (!) 106     Resp 07/20/17 0341 (!) 24     Temp 07/20/17 0341 (!) 97.5 F (36.4 C)     Temp Source 07/20/17 0341 Oral     SpO2 07/20/17 0341 99 %     Weight 07/20/17 0339 99.8 kg (220 lb)     Height 07/20/17 0339 1.753 m (5\' 9" )     Head Circumference --      Peak Flow --      Pain Score 07/20/17 0339 10     Pain Loc --      Pain Edu? --      Excl. in [place]? --     Constitutional: Alert and oriented.  Apparent discomfort Eyes: Conjunctivae are normal.  Head: Atraumatic. Mouth/Throat: Mucous membranes are moist. Oropharynx non-erythematous. Neck: No stridor.  Cardiovascular: Normal rate, regular rhythm. Good peripheral circulation. Grossly normal heart sounds. Respiratory: Normal respiratory effort.  No retractions. Lungs CTAB. Gastrointestinal: Soft and nontender. No distention.  Musculoskeletal: No lower extremity tenderness nor edema. No gross deformities of extremities. Neurologic:  Normal speech and language. No gross focal neurologic deficits are appreciated.  Skin:  Skin is warm, dry and intact. No rash noted. Psychiatric: Mood and affect are normal. Speech and behavior are normal. ____________________________________________   LABS (all labs ordered are listed, but only abnormal results are displayed)  Labs Reviewed  COMPREHENSIVE METABOLIC PANEL - Abnormal; Notable for  the following components:      Result Value   Sodium 134 (*)    CO2 18 (*)    Glucose, Bld 236 (*)    All other components within normal limits  CBC  TROPONIN I   ____________________________________________  EKG  ED ECG REPORT I,  N Shaila Gilchrest, the attending physician, personally viewed and interpreted this ECG.   Date: 07/20/2017  EKG Time: 4:04 AM  Rate: 100  Rhythm: Sinus tachycardia  Axis: Normal  Intervals: Normal  ST&T Change: None   Procedures   ____________________________________________   INITIAL IMPRESSION / ASSESSMENT AND PLAN / ED COURSE  As part of my medical decision making, I reviewed the following data within the electronic MEDICAL RECORD [id number] year old male presented with history and physical exam consistent with peripheral neuropathy.  Patient states that he  ran out of his Lyrica approximately 1 month ago.  Laboratory data notable for a CK of 600 patient given 1 L IV normal saline bolus while in the emergency department. ____________________________________________  FINAL CLINICAL IMPRESSION(S) / ED DIAGNOSES  Final diagnoses:  Idiopathic peripheral neuropathy     MEDICATIONS GIVEN DURING THIS VISIT:  Medications  sodium chloride 0.9 % bolus 1,000 mL (0 mLs Intravenous Stopped 07/20/17 0502)  morphine 4 MG/ML injection 4 mg (4 mg Intravenous Given 07/20/17 0406)  ondansetron (ZOFRAN) injection 4 mg (4 mg Intravenous Given 07/20/17 0407)     ED Discharge Orders    None       Note:  This document was prepared using Dragon voice recognition software and may include unintentional dictation errors.    Gregor Hams, MD 07/20/17 [phone]

## 2017-07-20 NOTE — ED Notes (Signed)
Pt's spouse out to desk inquiring about po fluids and wait for md reassessment. Pt's spouse updated.

## 2017-07-20 NOTE — ED Notes (Signed)
Pt repositioned for comfort. Pt is requesting no visitors at this time.

## 2017-07-20 NOTE — ED Notes (Signed)
Pt now complaining of chest pain. ekg obtained, will notify md.

## 2017-07-20 NOTE — ED Notes (Signed)
md in to reassess. 

## 2017-07-20 NOTE — ED Notes (Signed)
Dr. Owens Shark notified of pt's chest pain, ekg given to dr. Owens Shark to review.

## 2017-07-20 NOTE — ED Notes (Signed)
Pt yelling out when staff is near, but is more quiet when staff are not in room. Pt complains of bilateral leg cramps from thighs to feet. Pt states he was admitted for same in the summer. Pt with cms intact to toes.

## 2017-08-02 ENCOUNTER — Other Ambulatory Visit: Payer: Self-pay

## 2017-08-02 ENCOUNTER — Emergency Department
Admission: EM | Admit: 2017-08-02 | Discharge: 2017-08-02 | Disposition: A | Discharge status: Home / Self Care | Payer: Medicaid Other | Attending: Emergency Medicine | Admitting: Emergency Medicine

## 2017-08-02 ENCOUNTER — Encounter: Payer: Self-pay | Admitting: Emergency Medicine

## 2017-08-02 DIAGNOSIS — E119 Type 2 diabetes mellitus without complications: Secondary | ICD-10-CM | POA: Diagnosis not present

## 2017-08-02 DIAGNOSIS — K0889 Other specified disorders of teeth and supporting structures: Secondary | ICD-10-CM | POA: Insufficient documentation

## 2017-08-02 DIAGNOSIS — Z79899 Other long term (current) drug therapy: Secondary | ICD-10-CM | POA: Diagnosis not present

## 2017-08-02 DIAGNOSIS — I10 Essential (primary) hypertension: Secondary | ICD-10-CM | POA: Diagnosis not present

## 2017-08-02 DIAGNOSIS — E785 Hyperlipidemia, unspecified: Secondary | ICD-10-CM | POA: Diagnosis not present

## 2017-08-02 DIAGNOSIS — Z7982 Long term (current) use of aspirin: Secondary | ICD-10-CM | POA: Diagnosis not present

## 2017-08-02 DIAGNOSIS — Z8673 Personal history of transient ischemic attack (TIA), and cerebral infarction without residual deficits: Secondary | ICD-10-CM | POA: Insufficient documentation

## 2017-08-02 DIAGNOSIS — Z794 Long term (current) use of insulin: Secondary | ICD-10-CM | POA: Insufficient documentation

## 2017-08-02 MED ORDER — HYDROCODONE-ACETAMINOPHEN 5-325 MG PO TABS
1.0000 | ORAL_TABLET | Freq: Once | ORAL | Status: AC
  Administered 2017-08-02: 1 via ORAL
  Filled 2017-08-02: qty 1

## 2017-08-02 MED ORDER — HYDROCODONE-ACETAMINOPHEN 5-325 MG PO TABS: 1.0000 | ORAL_TABLET | Freq: Three times a day (TID) | ORAL | 0 refills | Status: AC | PRN

## 2017-08-02 MED ORDER — AMOXICILLIN 500 MG PO CAPS: 500.0000 mg | ORAL_CAPSULE | Freq: Three times a day (TID) | ORAL | 0 refills | Status: AC

## 2017-08-02 NOTE — ED Triage Notes (Signed)
C/O right lower jaw dental pain.  Onset of sympotms last night.  Has appointment with dentist on Tuesday.  Recently had 4 teeth extracted.

## 2017-08-02 NOTE — Discharge Instructions (Signed)
OPTIONS FOR DENTAL FOLLOW UP CARE ° °[place] [facility] and [facility] - Local Safety Net Dental Clinics °http://www.ncdhhs.gov/dph/oralhealth/services/safetynetclinics.htm °  °[facility] (336-562-3123) ° °[facility] [place] (919-933-9087) ° °[facility] (919-663-1744 ext [phone]) ° °[facility] ([phone]) ° °[facility] ([phone]) °This clinic caters to the indigent population and is on a lottery system. °Location: °[facility], [address], [place] °Clinic Hours: °Wednesdays from 6pm - 9pm, patients seen by a lottery system. °For dates, call or go to www.med.[facility].edu/[facility]/patients/Dental-[facility] °Services: °Cleanings, fillings and simple extractions. °Payment Options: °DENTAL WORK IS FREE OF CHARGE. Bring proof of income or support. °Best way to get seen: °Arrive at 5:15 pm - this is a lottery, NOT first come/first serve, so arriving earlier will not increase your chances of being seen. °  °  °[facility] °919-537-3737 °Select option 1 for emergencies °  °Location: °[facility], [address], [place] °Clinic Hours: °No walk-ins accepted - call the day before to schedule an appointment. °Check in times are 9:30 am and 1:30 pm. °Services: °Simple extractions, temporary fillings, pulpectomy/pulp debridement, uncomplicated abscess drainage. °Payment Options: °PAYMENT IS DUE AT THE TIME OF SERVICE.  Fee is usually $100-200, additional surgical procedures (e.g. abscess drainage) may be extra. °Cash, checks, Visa/MasterCard accepted.  Can file Medicaid if patient is covered for dental - patient should call case worker to check. °No discount for [facility] patients. °Best way to get seen: °MUST call the day before and get onto the schedule. Can usually be seen the next 1-2 days. No walk-ins accepted. °  °  °[facility] °[phone] °   °Location: °[facility], [address], [place] °Clinic Hours: °M, W, Th, F 8am or 1:30pm, Tues 9a or 1:30 - first come/first served. °Services: °Simple extractions, temporary fillings, uncomplicated abscess drainage.  You do not need to be an [place] resident. °Payment Options: °PAYMENT IS DUE AT THE TIME OF SERVICE. °Dental insurance, otherwise sliding scale - bring proof of income or support. °Depending on income and treatment needed, cost is usually $50-200. °Best way to get seen: °Arrive early as it is first come/first served. °  °  °[facility] °[phone] °  °Location: °[address] °Clinic Hours: °Mon-Thu 8a-5p °Services: °Most basic dental services including extractions and fillings. °Payment Options: °PAYMENT IS DUE AT THE TIME OF SERVICE. °Sliding scale, up to 50% off - bring proof if income or support. °Medicaid with dental option accepted. °Best way to get seen: °Call to schedule an appointment, can usually be seen within 2 weeks OR they will try to see walk-ins - show up at 8a or 2p (you may have to wait). °  °  °[facility] °919-245-2435 °[place] RESIDENTS ONLY °  °Location: °[facility], [address], [place], [place] [address] °Clinic Hours: By appointment only. °Monday - Thursday 8am-5pm, Friday 8am-12pm °Services: Cleanings, fillings, extractions. °Payment Options: °PAYMENT IS DUE AT THE TIME OF SERVICE. °Cash, Visa or MasterCard. Sliding scale - $30 minimum per service. °Best way to get seen: °Come in to office, complete packet and make an appointment - need proof of income °or support monies for each household member and proof of [place] residence. °Usually takes about a month to get in. °  °  °[facility] °[phone] °  °Location: °[address]   [place] °Clinic Hours: Walk-in Urgent Care Dental Services are offered Monday-Friday  mornings only. °The numbers of emergencies accepted daily is limited to the number of °providers available. °Maximum 15 - Mondays, Wednesdays & Thursdays °Maximum 10 - Tuesdays & Fridays °Services: °You do not need to be a [place] resident to be seen for a dental emergency. °Emergencies are defined as pain, swelling, abnormal bleeding, or dental trauma. Walkins will receive x-rays if needed. °NOTE: Dental cleaning is not an emergency. °Payment Options: °PAYMENT IS DUE AT THE TIME OF SERVICE. °Minimum co-pay is $40.00 for uninsured patients. °Minimum co-pay is $3.00 for Medicaid with dental coverage. °Dental Insurance is accepted and must be presented at time of visit. °Medicare does not cover dental. °Forms of payment: Cash, credit card, checks. °Best way to get seen: °If not previously registered with the clinic, walk-in dental registration begins at 7:15 am and is on a first come/first serve basis. °If previously registered with the clinic, call to make an appointment. °  °  °[facility] °[phone] °[place] RESIDENTS ONLY °  °Location: °[address], [place], [place] °Clinic Hours: °Mon-Thu 10a-2p °Services: Extractions only! °Payment Options: °FREE (donations accepted) - bring proof of income or support °Best way to get seen: °Call and schedule an appointment OR come at 8am on the 1st Monday of every month (except for holidays) when it is first come/first served. °  °  °[facility] °[phone] °  °Location: °[address], [place] °Clinic Hours: °Friday mornings °Services, Payment Options, Best way to get seen: °Call for info °

## 2017-08-02 NOTE — ED Notes (Signed)
Pt reports having 3-4 teeth pulled in the last couple months. Pt states "I am a diabetic and have bad teeth." Pt states pain today is the same pain that he had in the others prior to them being pulled.

## 2017-08-02 NOTE — ED Provider Notes (Signed)
[facility] Emergency Department Provider Note  ____________________________________________  Time seen: Approximately 6:51 PM  I have reviewed the triage vital signs and the nursing notes.   HISTORY  Chief Complaint Dental Pain    HPI Christopher [name] is a 51 y.o. male presents to the emergency department with inferior 31 pain.  Patient reports that he has an appointment for this Wednesday.  Patient reports that dentist informed him that if they had a cancellation that he might be able to be seen on Tuesday.  Patient states that he recently has had 4 teeth pulled over the past 6 months and is working to have teeth pulled so that he can have dentures.  He is also requesting a prescription for antibiotic because the  antibiotic he has is "old".  He denies fever and chills.  Patient has been taking Tylenol.   Past Medical History:  Diagnosis Date  . Anxiety   . Arthritis   . Cancer [facility])    Prostate  . Diabetes mellitus without complication ([place])   . Hyperlipidemia   . Hypertension   . Neuropathy   . Neuropathy   . Sleep apnea     Patient Active Problem List   Diagnosis Date Noted  . TIA (transient ischemic attack) 02/01/2017  . Cramps, muscle, general 07/04/2015    Past Surgical History:  Procedure Laterality Date  . APPENDECTOMY    . PROSTATECTOMY      Prior to Admission medications   Medication Sig Start Date End Date Taking? Authorizing Provider  amLODipine (NORVASC) 5 MG tablet Take 1 tablet (5 mg total) by mouth daily. 02/02/17   Bettey Costa, MD  amoxicillin (AMOXIL) 500 MG capsule Take 1 capsule (500 mg total) by mouth 3 (three) times daily for 10 days. 08/02/17 08/12/17  Lannie Fields, PA-C  aspirin EC 81 MG tablet Take 1 tablet (81 mg total) by mouth daily. 02/02/17   Bettey Costa, MD  atorvastatin (LIPITOR) 20 MG tablet Take 20 mg by mouth daily at 6 PM.    [provider]  cetirizine (ZYRTEC) 10 MG tablet Take 1 tablet (10 mg  total) by mouth daily. 02/02/17   Bettey Costa, MD  cyclobenzaprine (FLEXERIL) 10 MG tablet Take 1 tablet (10 mg total) by mouth 3 (three) times daily as needed for muscle spasms. 07/06/15   Demetrios Loll, MD  DULoxetine (CYMBALTA) 30 MG capsule Take 1 capsule (30 mg total) by mouth daily. 02/02/17   Bettey Costa, MD  fluticasone (FLONASE) 50 MCG/ACT nasal spray Place 2 sprays into both nostrils daily. 01/10/15 01/10/16  Johnn Hai, PA-C  hydrochlorothiazide (HYDRODIURIL) 25 MG tablet Take 1 tablet (25 mg total) by mouth daily. 02/02/17   Bettey Costa, MD  HYDROcodone-acetaminophen (NORCO) 5-325 MG tablet Take 1 tablet by mouth 3 (three) times daily as needed for up to 2 days for moderate pain. 08/02/17 08/04/17  Vallarie Mare M, PA-C  insulin aspart (NOVOLOG) 100 UNIT/ML FlexPen Inject 5-20 Units into the skin 3 (three) times daily with meals. Sliding scale 01/22/14   [provider]  insulin glargine (LANTUS) 100 UNIT/ML injection Inject 0.6 mLs (60 Units total) into the skin 2 (two) times daily. 02/02/17 03/04/17  Bettey Costa, MD  lisinopril (PRINIVIL,ZESTRIL) 10 MG tablet Take 1 tablet (10 mg total) by mouth daily. 02/02/17   Bettey Costa, MD  LORazepam (ATIVAN) 0.5 MG tablet Take 1 tablet (0.5 mg total) by mouth daily. 02/02/17   Bettey Costa, MD  nystatin cream (MYCOSTATIN)  Apply topically 2 (two) times daily. 02/02/17   Bettey Costa, MD  oxyCODONE-acetaminophen (ROXICET) 5-325 MG tablet Take 1 tablet by mouth every 6 (six) hours as needed. 04/09/17 04/09/18  Little, Traci M, PA-C  oxyCODONE-acetaminophen (ROXICET) 5-325 MG tablet Take 1 tablet by mouth every 6 (six) hours as needed. 04/11/17 04/11/18  Sable Feil, PA-C  pregabalin (LYRICA) 100 MG capsule Take 1 capsule (100 mg total) by mouth 2 (two) times daily. 02/02/17   Bettey Costa, MD  pregabalin (LYRICA) 50 MG capsule Take 1 capsule (50 mg total) by mouth 3 (three) times daily. 07/20/17 07/20/18  Gregor Hams, MD  terazosin (HYTRIN) 1 MG  capsule Take 1 capsule (1 mg total) by mouth daily. 02/02/17   Bettey Costa, MD    Allergies Tramadol  Family History  Problem Relation Age of Onset  . Cancer Father   . Stroke Father     Social History Social History   Tobacco Use  . Smoking status: Never Smoker  . Smokeless tobacco: Never Used  Substance Use Topics  . Alcohol use: No  . Drug use: No     Review of Systems  Constitutional: No fever/chills Eyes: No visual changes. No discharge ENT: Patient has Inferior 31 pain.  Cardiovascular: no chest pain. Respiratory: no cough. No SOB. Musculoskeletal: Negative for musculoskeletal pain. Skin: Negative for rash, abrasions, lacerations, ecchymosis. Neurological: Negative for headaches, focal weakness or numbness.   ____________________________________________   PHYSICAL EXAM:  VITAL SIGNS: ED Triage Vitals  Enc Vitals Group     BP 08/02/17 1819 (!) 140/97     Pulse Rate 08/02/17 1819 96     Resp 08/02/17 1819 18     Temp 08/02/17 1819 97.8 F (36.6 C)     Temp Source 08/02/17 1819 Oral     SpO2 08/02/17 1819 97 %     Weight 08/02/17 1815 220 lb (99.8 kg)     Height 08/02/17 1815 5\' 9"  (1.753 m)     Head Circumference --      Peak Flow --      Pain Score 08/02/17 1814 7     Pain Loc --      Pain Edu? --      Excl. in [place]? --      Constitutional: Alert and oriented. Well appearing and in no acute distress. Eyes: Conjunctivae are normal. PERRL. EOMI. Head: Atraumatic. ENT:      Nose: No congestion/rhinnorhea.      Mouth/Throat: Mucous membranes are moist.  Patient has dental caries of inferior 31.  No other dental caries. Neck: No stridor. No cervical spine tenderness to palpation.  Cardiovascular: Normal rate, regular rhythm. Normal S1 and S2.  Good peripheral circulation. Respiratory: Normal respiratory effort without tachypnea or retractions. Lungs CTAB. Good air entry to the bases with no decreased or absent breath sounds. Musculoskeletal: Full  range of motion to all extremities. No gross deformities appreciated. Neurologic:  Normal speech and language. No gross focal neurologic deficits are appreciated.  Skin:  Skin is warm, dry and intact. No rash noted. Psychiatric: Mood and affect are normal. Speech and behavior are normal. Patient exhibits appropriate insight and judgement.   ____________________________________________   LABS (all labs ordered are listed, but only abnormal results are displayed)  Labs Reviewed - No data to display ____________________________________________  EKG   ____________________________________________  RADIOLOGY   No results found.  ____________________________________________    PROCEDURES  Procedure(s) performed:    Procedures    Medications  HYDROcodone-acetaminophen (NORCO/VICODIN) 5-325 MG per tablet 1 tablet (1 tablet Oral Given 08/02/17 1847)     ____________________________________________   INITIAL IMPRESSION / ASSESSMENT AND PLAN / ED COURSE  Pertinent labs & imaging results that were available during my care of the patient were reviewed by me and considered in my medical decision making (see chart for details).  Review of the [place] CSRS was performed in accordance of the [place] prior to dispensing any controlled drugs.    Assessment and plan Dental pain Patient presents to the emergency department with dental pain surrounding inferior 31.  Patient was given a Norco in the emergency department.  He was discharged with a 2-day course of Norco as well as amoxicillin.  He was advised to keep appointment with a local dentist on Wednesday.  Vital signs are reassuring prior to discharge.  All patient questions were answered. ____________________________________________  FINAL CLINICAL IMPRESSION(S) / ED DIAGNOSES  Final diagnoses:  Pain, dental      NEW MEDICATIONS STARTED DURING THIS VISIT:  ED Discharge Orders        Ordered    HYDROcodone-acetaminophen  (NORCO) 5-325 MG tablet  3 times daily PRN     08/02/17 1843    amoxicillin (AMOXIL) 500 MG capsule  3 times daily     08/02/17 1846          This chart was dictated using voice recognition software/Dragon. Despite best efforts to proofread, errors can occur which can change the meaning. Any change was purely unintentional.    Lannie Fields, PA-C 08/02/17 1859    Eula Listen, MD 08/02/17 2325

## 2017-08-02 NOTE — ED Notes (Signed)
Reviewed discharge instructions, follow-up care, and prescriptions with patient. Patient verbalized understanding of all information reviewed. Patient stable, with no acute distress noted at this time.

## 2018-02-16 ENCOUNTER — Emergency Department
Admission: EM | Admit: 2018-02-16 | Discharge: 2018-02-16 | Disposition: A | Discharge status: Home / Self Care | Payer: Medicaid Other | Attending: Emergency Medicine | Admitting: Emergency Medicine

## 2018-02-16 ENCOUNTER — Other Ambulatory Visit: Payer: Self-pay

## 2018-02-16 ENCOUNTER — Emergency Department: Payer: Medicaid Other

## 2018-02-16 ENCOUNTER — Encounter: Payer: Self-pay | Admitting: Emergency Medicine

## 2018-02-16 DIAGNOSIS — Y999 Unspecified external cause status: Secondary | ICD-10-CM | POA: Insufficient documentation

## 2018-02-16 DIAGNOSIS — E1165 Type 2 diabetes mellitus with hyperglycemia: Secondary | ICD-10-CM | POA: Diagnosis not present

## 2018-02-16 DIAGNOSIS — I10 Essential (primary) hypertension: Secondary | ICD-10-CM | POA: Diagnosis not present

## 2018-02-16 DIAGNOSIS — Z794 Long term (current) use of insulin: Secondary | ICD-10-CM | POA: Insufficient documentation

## 2018-02-16 DIAGNOSIS — S0181XA Laceration without foreign body of other part of head, initial encounter: Secondary | ICD-10-CM | POA: Diagnosis not present

## 2018-02-16 DIAGNOSIS — Y9355 Activity, bike riding: Secondary | ICD-10-CM | POA: Insufficient documentation

## 2018-02-16 DIAGNOSIS — Y9289 Other specified places as the place of occurrence of the external cause: Secondary | ICD-10-CM | POA: Insufficient documentation

## 2018-02-16 DIAGNOSIS — S80211A Abrasion, right knee, initial encounter: Secondary | ICD-10-CM | POA: Diagnosis not present

## 2018-02-16 DIAGNOSIS — S0990XA Unspecified injury of head, initial encounter: Secondary | ICD-10-CM | POA: Diagnosis present

## 2018-02-16 DIAGNOSIS — S022XXA Fracture of nasal bones, initial encounter for closed fracture: Secondary | ICD-10-CM | POA: Diagnosis not present

## 2018-02-16 DIAGNOSIS — Z79899 Other long term (current) drug therapy: Secondary | ICD-10-CM | POA: Diagnosis not present

## 2018-02-16 DIAGNOSIS — S40812A Abrasion of left upper arm, initial encounter: Secondary | ICD-10-CM | POA: Insufficient documentation

## 2018-02-16 DIAGNOSIS — S40211A Abrasion of right shoulder, initial encounter: Secondary | ICD-10-CM | POA: Insufficient documentation

## 2018-02-16 DIAGNOSIS — S80212A Abrasion, left knee, initial encounter: Secondary | ICD-10-CM | POA: Insufficient documentation

## 2018-02-16 DIAGNOSIS — T07XXXA Unspecified multiple injuries, initial encounter: Secondary | ICD-10-CM

## 2018-02-16 LAB — ETHANOL

## 2018-02-16 LAB — BASIC METABOLIC PANEL
Anion gap: 9 (ref 5–15)
BUN: 6 mg/dL (ref 6–20)
CHLORIDE: 101 mmol/L (ref 98–111)
CO2: 22 mmol/L (ref 22–32)
Calcium: 9.3 mg/dL (ref 8.9–10.3)
Creatinine, Ser: 0.96 mg/dL (ref 0.61–1.24)
GFR calc non Af Amer: >60 mL/min (ref >60)
Glucose, Bld: 596 mg/dL (ref 70–99)
POTASSIUM: 3.9 mmol/L (ref 3.5–5.1)
SODIUM: 132 mmol/L — AB (ref 135–145)

## 2018-02-16 LAB — CBC
HEMATOCRIT: 46 % (ref 40.0–52.0)
HEMOGLOBIN: 14.6 g/dL (ref 13.0–18.0)
MCH: 27.9 pg (ref 26.0–34.0)
MCHC: 31.8 g/dL — ABNORMAL LOW (ref 32.0–36.0)
MCV: 87.9 fL (ref 80.0–100.0)
Platelets: 254 10*3/uL (ref 150–440)
RBC: 5.23 MIL/uL (ref 4.40–5.90)
RDW: 14.9 % — ABNORMAL HIGH (ref 11.5–14.5)
WBC: 10.1 10*3/uL (ref 3.8–10.6)

## 2018-02-16 LAB — GLUCOSE, CAPILLARY
GLUCOSE-CAPILLARY: 395 mg/dL — AB (ref 70–99)
GLUCOSE-CAPILLARY: 95 mg/dL (ref 70–99)

## 2018-02-16 MED ORDER — BACITRACIN ZINC 500 UNIT/GM EX OINT
TOPICAL_OINTMENT | Freq: Two times a day (BID) | CUTANEOUS | Status: DC
  Administered 2018-02-16: 1 via TOPICAL

## 2018-02-16 MED ORDER — FENTANYL CITRATE (PF) 100 MCG/2ML IJ SOLN
100.0000 ug | Freq: Once | INTRAMUSCULAR | Status: AC
  Administered 2018-02-16: 100 ug via INTRAVENOUS
  Filled 2018-02-16: qty 2

## 2018-02-16 MED ORDER — OXYCODONE-ACETAMINOPHEN 5-325 MG PO TABS: 2.0000 | ORAL_TABLET | ORAL | 0 refills | Status: DC | PRN

## 2018-02-16 MED ORDER — OXYCODONE-ACETAMINOPHEN 5-325 MG PO TABS
2.0000 | ORAL_TABLET | Freq: Once | ORAL | Status: AC
  Administered 2018-02-16: 2 via ORAL
  Filled 2018-02-16: qty 2

## 2018-02-16 MED ORDER — SODIUM CHLORIDE 0.9 % IV BOLUS
500.0000 mL | Freq: Once | INTRAVENOUS | Status: AC
  Administered 2018-02-16: 500 mL via INTRAVENOUS

## 2018-02-16 MED ORDER — LIDOCAINE HCL (PF) 1 % IJ SOLN
5.0000 mL | Freq: Once | INTRAMUSCULAR | Status: AC
  Administered 2018-02-16: 5 mL via INTRADERMAL

## 2018-02-16 MED ORDER — SODIUM CHLORIDE 0.9 % IV BOLUS
1000.0000 mL | Freq: Once | INTRAVENOUS | Status: AC
  Administered 2018-02-16: 1000 mL via INTRAVENOUS

## 2018-02-16 MED ORDER — KETOROLAC TROMETHAMINE 30 MG/ML IJ SOLN
30.0000 mg | Freq: Once | INTRAMUSCULAR | Status: AC
  Administered 2018-02-16: 30 mg via INTRAVENOUS
  Filled 2018-02-16: qty 1

## 2018-02-16 MED ORDER — LIDOCAINE HCL (PF) 1 % IJ SOLN
INTRAMUSCULAR | Status: AC
  Administered 2018-02-16: 5 mL via INTRADERMAL
  Filled 2018-02-16: qty 5

## 2018-02-16 MED ORDER — INSULIN ASPART 100 UNIT/ML ~~LOC~~ SOLN
8.0000 [IU] | Freq: Once | SUBCUTANEOUS | Status: AC
  Administered 2018-02-16: 8 [IU] via INTRAVENOUS
  Filled 2018-02-16: qty 1

## 2018-02-16 NOTE — ED Notes (Signed)
Pt given turkey sandwich tray and milk 
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
## 2018-02-16 NOTE — ED Notes (Signed)
See physical diagram for location of abrasions.

## 2018-02-16 NOTE — ED Notes (Signed)
Patient transported to CT 

## 2018-02-16 NOTE — Discharge Instructions (Addendum)
Please follow-up with your primary care physician for further evaluation of your symptoms.  Please take the pain medicine as you have been prescribed.  Please return with any worsening symptoms or any other concerns.
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
## 2018-11-25 ENCOUNTER — Encounter: Payer: Self-pay | Admitting: Emergency Medicine

## 2018-11-25 ENCOUNTER — Emergency Department: Payer: Medicaid Other

## 2018-11-25 ENCOUNTER — Inpatient Hospital Stay
Admission: EM | Admit: 2018-11-25 | Discharge: 2018-11-28 | DRG: 637 | Disposition: A | Discharge status: Home / Self Care | Payer: Medicaid Other | Attending: Internal Medicine | Admitting: Internal Medicine

## 2018-11-25 ENCOUNTER — Other Ambulatory Visit: Payer: Self-pay

## 2018-11-25 DIAGNOSIS — J1289 Other viral pneumonia: Secondary | ICD-10-CM | POA: Diagnosis present

## 2018-11-25 DIAGNOSIS — E785 Hyperlipidemia, unspecified: Secondary | ICD-10-CM | POA: Diagnosis present

## 2018-11-25 DIAGNOSIS — E1110 Type 2 diabetes mellitus with ketoacidosis without coma: Secondary | ICD-10-CM | POA: Diagnosis present

## 2018-11-25 DIAGNOSIS — E871 Hypo-osmolality and hyponatremia: Secondary | ICD-10-CM | POA: Diagnosis present

## 2018-11-25 DIAGNOSIS — I119 Hypertensive heart disease without heart failure: Secondary | ICD-10-CM | POA: Diagnosis present

## 2018-11-25 DIAGNOSIS — Z794 Long term (current) use of insulin: Secondary | ICD-10-CM | POA: Diagnosis not present

## 2018-11-25 DIAGNOSIS — Z79891 Long term (current) use of opiate analgesic: Secondary | ICD-10-CM | POA: Diagnosis not present

## 2018-11-25 DIAGNOSIS — M1990 Unspecified osteoarthritis, unspecified site: Secondary | ICD-10-CM | POA: Diagnosis present

## 2018-11-25 DIAGNOSIS — T383X6A Underdosing of insulin and oral hypoglycemic [antidiabetic] drugs, initial encounter: Secondary | ICD-10-CM | POA: Diagnosis present

## 2018-11-25 DIAGNOSIS — F419 Anxiety disorder, unspecified: Secondary | ICD-10-CM | POA: Diagnosis present

## 2018-11-25 DIAGNOSIS — Z7951 Long term (current) use of inhaled steroids: Secondary | ICD-10-CM

## 2018-11-25 DIAGNOSIS — Z885 Allergy status to narcotic agent status: Secondary | ICD-10-CM

## 2018-11-25 DIAGNOSIS — Z20828 Contact with and (suspected) exposure to other viral communicable diseases: Secondary | ICD-10-CM | POA: Diagnosis present

## 2018-11-25 DIAGNOSIS — G4730 Sleep apnea, unspecified: Secondary | ICD-10-CM | POA: Diagnosis present

## 2018-11-25 DIAGNOSIS — Z91128 Patient's intentional underdosing of medication regimen for other reason: Secondary | ICD-10-CM

## 2018-11-25 DIAGNOSIS — Z79899 Other long term (current) drug therapy: Secondary | ICD-10-CM

## 2018-11-25 DIAGNOSIS — I248 Other forms of acute ischemic heart disease: Secondary | ICD-10-CM | POA: Diagnosis present

## 2018-11-25 DIAGNOSIS — J189 Pneumonia, unspecified organism: Secondary | ICD-10-CM | POA: Diagnosis present

## 2018-11-25 DIAGNOSIS — B9789 Other viral agents as the cause of diseases classified elsewhere: Secondary | ICD-10-CM | POA: Diagnosis present

## 2018-11-25 DIAGNOSIS — Z7982 Long term (current) use of aspirin: Secondary | ICD-10-CM | POA: Diagnosis not present

## 2018-11-25 DIAGNOSIS — R590 Localized enlarged lymph nodes: Secondary | ICD-10-CM | POA: Diagnosis not present

## 2018-11-25 DIAGNOSIS — J181 Lobar pneumonia, unspecified organism: Secondary | ICD-10-CM

## 2018-11-25 DIAGNOSIS — E1140 Type 2 diabetes mellitus with diabetic neuropathy, unspecified: Secondary | ICD-10-CM | POA: Diagnosis present

## 2018-11-25 DIAGNOSIS — E0810 Diabetes mellitus due to underlying condition with ketoacidosis without coma: Secondary | ICD-10-CM

## 2018-11-25 LAB — BLOOD GAS, VENOUS
Acid-base deficit: 0.3 mmol/L (ref 0.0–2.0)
Bicarbonate: 24.1 mmol/L (ref 20.0–28.0)
O2 Saturation: 78 %
Patient temperature: 37
pCO2, Ven: 38 mmHg — ABNORMAL LOW (ref 44.0–60.0)
pH, Ven: 7.41 (ref 7.250–7.430)
pO2, Ven: 42 mmHg (ref 32.0–45.0)

## 2018-11-25 LAB — TROPONIN I
Troponin I: 0.08 ng/mL (ref <0.03)
Troponin I: 0.05 ng/mL (ref <0.03)

## 2018-11-25 LAB — URINALYSIS, COMPLETE (UACMP) WITH MICROSCOPIC
Bacteria, UA: NONE SEEN
Bilirubin Urine: NEGATIVE
Glucose, UA: >=500 mg/dL — AB
Ketones, ur: 20 mg/dL — AB
Leukocytes,Ua: NEGATIVE
Nitrite: NEGATIVE
Protein, ur: NEGATIVE mg/dL
Specific Gravity, Urine: 1.028 (ref 1.005–1.030)
Squamous Epithelial / LPF: NONE SEEN (ref 0–5)
WBC, UA: NONE SEEN WBC/hpf (ref 0–5)
pH: 5 (ref 5.0–8.0)

## 2018-11-25 LAB — COMPREHENSIVE METABOLIC PANEL
ALT: 13 U/L (ref 0–44)
ALT: 10 U/L (ref 0–44)
AST: 12 U/L — ABNORMAL LOW (ref 15–41)
AST: 14 U/L — ABNORMAL LOW (ref 15–41)
Albumin: 3.3 g/dL — ABNORMAL LOW (ref 3.5–5.0)
Albumin: 2.9 g/dL — ABNORMAL LOW (ref 3.5–5.0)
Alkaline Phosphatase: 123 U/L (ref 38–126)
Alkaline Phosphatase: 96 U/L (ref 38–126)
Anion gap: 17 — ABNORMAL HIGH (ref 5–15)
Anion gap: 10 (ref 5–15)
BUN: 8 mg/dL (ref 6–20)
BUN: 8 mg/dL (ref 6–20)
CO2: 23 mmol/L (ref 22–32)
CO2: 24 mmol/L (ref 22–32)
Calcium: 9.5 mg/dL (ref 8.9–10.3)
Calcium: 9.2 mg/dL (ref 8.9–10.3)
Chloride: 90 mmol/L — ABNORMAL LOW (ref 98–111)
Chloride: 95 mmol/L — ABNORMAL LOW (ref 98–111)
Creatinine, Ser: 0.99 mg/dL (ref 0.61–1.24)
Creatinine, Ser: 0.89 mg/dL (ref 0.61–1.24)
GFR calc Af Amer: >60 mL/min (ref >60)
GFR calc Af Amer: >60 mL/min (ref >60)
GFR calc non Af Amer: >60 mL/min (ref >60)
GFR calc non Af Amer: >60 mL/min (ref >60)
Glucose, Bld: 540 mg/dL (ref 70–99)
Glucose, Bld: 287 mg/dL — ABNORMAL HIGH (ref 70–99)
Potassium: 4.2 mmol/L (ref 3.5–5.1)
Potassium: 3.6 mmol/L (ref 3.5–5.1)
Sodium: 130 mmol/L — ABNORMAL LOW (ref 135–145)
Sodium: 129 mmol/L — ABNORMAL LOW (ref 135–145)
Total Bilirubin: 1.1 mg/dL (ref 0.3–1.2)
Total Bilirubin: 0.6 mg/dL (ref 0.3–1.2)
Total Protein: 8.3 g/dL — ABNORMAL HIGH (ref 6.5–8.1)
Total Protein: 8.2 g/dL — ABNORMAL HIGH (ref 6.5–8.1)

## 2018-11-25 LAB — CBC WITH DIFFERENTIAL/PLATELET
Abs Immature Granulocytes: 0.08 10*3/uL — ABNORMAL HIGH (ref 0.00–0.07)
Basophils Absolute: 0 10*3/uL (ref 0.0–0.1)
Basophils Relative: 0 %
Eosinophils Absolute: 0 10*3/uL (ref 0.0–0.5)
Eosinophils Relative: 0 %
HCT: 37.5 % — ABNORMAL LOW (ref 39.0–52.0)
Hemoglobin: 12 g/dL — ABNORMAL LOW (ref 13.0–17.0)
Immature Granulocytes: 1 %
Lymphocytes Relative: 7 %
Lymphs Abs: 1.1 10*3/uL (ref 0.7–4.0)
MCH: 27 pg (ref 26.0–34.0)
MCHC: 32 g/dL (ref 30.0–36.0)
MCV: 84.3 fL (ref 80.0–100.0)
Monocytes Absolute: 1.5 10*3/uL — ABNORMAL HIGH (ref 0.1–1.0)
Monocytes Relative: 10 %
Neutro Abs: 12.1 10*3/uL — ABNORMAL HIGH (ref 1.7–7.7)
Neutrophils Relative %: 82 %
Platelets: 333 10*3/uL (ref 150–400)
RBC: 4.45 MIL/uL (ref 4.22–5.81)
RDW: 12 % (ref 11.5–15.5)
WBC: 14.7 10*3/uL — ABNORMAL HIGH (ref 4.0–10.5)
nRBC: 0 % (ref 0.0–0.2)

## 2018-11-25 LAB — GLUCOSE, CAPILLARY
Glucose-Capillary: 493 mg/dL — ABNORMAL HIGH (ref 70–99)
Glucose-Capillary: 422 mg/dL — ABNORMAL HIGH (ref 70–99)
Glucose-Capillary: 385 mg/dL — ABNORMAL HIGH (ref 70–99)
Glucose-Capillary: 281 mg/dL — ABNORMAL HIGH (ref 70–99)
Glucose-Capillary: 201 mg/dL — ABNORMAL HIGH (ref 70–99)
Glucose-Capillary: 217 mg/dL — ABNORMAL HIGH (ref 70–99)
Glucose-Capillary: 190 mg/dL — ABNORMAL HIGH (ref 70–99)

## 2018-11-25 LAB — BETA-HYDROXYBUTYRIC ACID: Beta-Hydroxybutyric Acid: 2.44 mmol/L — ABNORMAL HIGH (ref 0.05–0.27)

## 2018-11-25 LAB — LIPASE, BLOOD: Lipase: 25 U/L (ref 11–51)

## 2018-11-25 LAB — PROCALCITONIN: Procalcitonin: 2.3 ng/mL

## 2018-11-25 LAB — INFLUENZA PANEL BY PCR (TYPE A & B)
Influenza A By PCR: NEGATIVE
Influenza B By PCR: NEGATIVE

## 2018-11-25 MED ORDER — INSULIN REGULAR(HUMAN) IN NACL 100-0.9 UT/100ML-% IV SOLN: INTRAVENOUS | Status: DC

## 2018-11-25 MED ORDER — POTASSIUM CHLORIDE 10 MEQ/100ML IV SOLN
10.0000 meq | INTRAVENOUS | Status: AC
  Administered 2018-11-25: 23:00:00 10 meq via INTRAVENOUS
  Filled 2018-11-25 (×2): qty 100

## 2018-11-25 MED ORDER — NYSTATIN 100000 UNIT/GM EX CREA
TOPICAL_CREAM | Freq: Two times a day (BID) | CUTANEOUS | Status: DC
  Administered 2018-11-27: 09:00:00 via TOPICAL
  Filled 2018-11-25: qty 15

## 2018-11-25 MED ORDER — DEXTROSE-NACL 5-0.45 % IV SOLN: INTRAVENOUS | Status: DC

## 2018-11-25 MED ORDER — AZITHROMYCIN 250 MG PO TABS
250.0000 mg | ORAL_TABLET | Freq: Every day | ORAL | Status: DC
  Administered 2018-11-26 – 2018-11-28 (×3): 250 mg via ORAL
  Filled 2018-11-25 (×3): qty 1

## 2018-11-25 MED ORDER — INSULIN REGULAR BOLUS VIA INFUSION
0.0000 [IU] | Freq: Three times a day (TID) | INTRAVENOUS | Status: DC
  Filled 2018-11-25: qty 10

## 2018-11-25 MED ORDER — INSULIN ASPART 100 UNIT/ML ~~LOC~~ SOLN: 0.0000 [IU] | Freq: Three times a day (TID) | SUBCUTANEOUS | Status: DC

## 2018-11-25 MED ORDER — SODIUM CHLORIDE 0.9 % IV SOLN
INTRAVENOUS | Status: DC
  Administered 2018-11-25: 15:00:00 via INTRAVENOUS

## 2018-11-25 MED ORDER — LEVOFLOXACIN IN D5W 750 MG/150ML IV SOLN
750.0000 mg | Freq: Once | INTRAVENOUS | Status: DC
  Filled 2018-11-25: qty 150

## 2018-11-25 MED ORDER — SODIUM CHLORIDE 0.9 % IV SOLN: INTRAVENOUS | Status: DC

## 2018-11-25 MED ORDER — SODIUM CHLORIDE 0.9 % IV SOLN
1.0000 g | INTRAVENOUS | Status: DC
  Administered 2018-11-26 – 2018-11-27 (×2): 1 g via INTRAVENOUS
  Filled 2018-11-25 (×2): qty 1

## 2018-11-25 MED ORDER — TERAZOSIN HCL 1 MG PO CAPS
1.0000 mg | ORAL_CAPSULE | Freq: Every day | ORAL | Status: DC
  Administered 2018-11-26 – 2018-11-28 (×3): 1 mg via ORAL
  Filled 2018-11-25 (×3): qty 1

## 2018-11-25 MED ORDER — FLUTICASONE PROPIONATE 50 MCG/ACT NA SUSP
2.0000 | Freq: Every day | NASAL | Status: DC
  Administered 2018-11-26 – 2018-11-28 (×3): 2 via NASAL
  Filled 2018-11-25: qty 16

## 2018-11-25 MED ORDER — CYCLOBENZAPRINE HCL 10 MG PO TABS
10.0000 mg | ORAL_TABLET | Freq: Three times a day (TID) | ORAL | Status: DC | PRN
  Administered 2018-11-26 – 2018-11-27 (×5): 10 mg via ORAL
  Filled 2018-11-25 (×5): qty 1

## 2018-11-25 MED ORDER — LEVOFLOXACIN IN D5W 750 MG/150ML IV SOLN
750.0000 mg | Freq: Once | INTRAVENOUS | Status: AC
  Administered 2018-11-25: 750 mg via INTRAVENOUS

## 2018-11-25 MED ORDER — DEXTROSE 50 % IV SOLN: 25.0000 mL | INTRAVENOUS | Status: DC | PRN

## 2018-11-25 MED ORDER — DULOXETINE HCL 30 MG PO CPEP
30.0000 mg | ORAL_CAPSULE | Freq: Every day | ORAL | Status: DC
  Administered 2018-11-26 – 2018-11-28 (×3): 30 mg via ORAL
  Filled 2018-11-25 (×4): qty 1

## 2018-11-25 MED ORDER — LORAZEPAM 0.5 MG PO TABS
0.5000 mg | ORAL_TABLET | Freq: Every day | ORAL | Status: DC
  Administered 2018-11-25 – 2018-11-28 (×4): 0.5 mg via ORAL
  Filled 2018-11-25 (×4): qty 1

## 2018-11-25 MED ORDER — INSULIN REGULAR(HUMAN) IN NACL 100-0.9 UT/100ML-% IV SOLN
INTRAVENOUS | Status: DC
  Administered 2018-11-25: 3.6 [IU]/h via INTRAVENOUS
  Filled 2018-11-25: qty 100

## 2018-11-25 MED ORDER — INSULIN GLARGINE 100 UNIT/ML ~~LOC~~ SOLN
10.0000 [IU] | Freq: Once | SUBCUTANEOUS | Status: AC
  Administered 2018-11-25: 20:00:00 10 [IU] via SUBCUTANEOUS
  Filled 2018-11-25: qty 0.1

## 2018-11-25 MED ORDER — PREGABALIN 50 MG PO CAPS
100.0000 mg | ORAL_CAPSULE | Freq: Two times a day (BID) | ORAL | Status: DC
  Administered 2018-11-26 – 2018-11-28 (×6): 100 mg via ORAL
  Filled 2018-11-25 (×6): qty 2

## 2018-11-25 MED ORDER — AZITHROMYCIN 500 MG PO TABS
500.0000 mg | ORAL_TABLET | Freq: Every day | ORAL | Status: AC
  Administered 2018-11-25: 500 mg via ORAL
  Filled 2018-11-25: qty 1

## 2018-11-25 MED ORDER — MORPHINE SULFATE (PF) 4 MG/ML IV SOLN
4.0000 mg | Freq: Once | INTRAVENOUS | Status: AC
  Administered 2018-11-25: 4 mg via INTRAVENOUS
  Filled 2018-11-25: qty 1

## 2018-11-25 MED ORDER — OXYCODONE-ACETAMINOPHEN 5-325 MG PO TABS
1.0000 | ORAL_TABLET | Freq: Four times a day (QID) | ORAL | Status: DC | PRN
  Administered 2018-11-25 – 2018-11-28 (×10): 1 via ORAL
  Filled 2018-11-25 (×10): qty 1

## 2018-11-25 MED ORDER — ONDANSETRON HCL 4 MG/2ML IJ SOLN
4.0000 mg | Freq: Once | INTRAMUSCULAR | Status: AC
  Administered 2018-11-25: 4 mg via INTRAVENOUS
  Filled 2018-11-25: qty 2

## 2018-11-25 MED ORDER — LORATADINE 10 MG PO TABS
10.0000 mg | ORAL_TABLET | Freq: Every day | ORAL | Status: DC
  Administered 2018-11-26 – 2018-11-28 (×3): 10 mg via ORAL
  Filled 2018-11-25 (×3): qty 1

## 2018-11-25 MED ORDER — ENOXAPARIN SODIUM 40 MG/0.4ML ~~LOC~~ SOLN
30.0000 mg | SUBCUTANEOUS | Status: DC
  Administered 2018-11-26: 30 mg via SUBCUTANEOUS
  Filled 2018-11-25: qty 0.4

## 2018-11-25 MED ORDER — ASPIRIN EC 81 MG PO TBEC
81.0000 mg | DELAYED_RELEASE_TABLET | Freq: Every day | ORAL | Status: DC
  Administered 2018-11-26 – 2018-11-28 (×3): 81 mg via ORAL
  Filled 2018-11-25 (×3): qty 1

## 2018-11-25 MED ORDER — DEXTROSE-NACL 5-0.45 % IV SOLN
INTRAVENOUS | Status: DC
  Administered 2018-11-25: 18:00:00 via INTRAVENOUS

## 2018-11-25 MED ORDER — LISINOPRIL 10 MG PO TABS
10.0000 mg | ORAL_TABLET | Freq: Every day | ORAL | Status: DC
  Administered 2018-11-26 – 2018-11-28 (×3): 10 mg via ORAL
  Filled 2018-11-25 (×3): qty 1

## 2018-11-25 MED ORDER — AMLODIPINE BESYLATE 5 MG PO TABS
5.0000 mg | ORAL_TABLET | Freq: Every day | ORAL | Status: DC
  Administered 2018-11-26 – 2018-11-28 (×3): 5 mg via ORAL
  Filled 2018-11-25 (×3): qty 1

## 2018-11-25 NOTE — ED Triage Notes (Signed)
Pt in via [facility] from home, reports N/V, generalized weakness x 2 days.  Per EMS, pt CBG 524.  4mg  Zofran and approximately 300cc NaCl bolus given prior to arrival.  NAD noted at this time.

## 2018-11-25 NOTE — ED Notes (Signed)
Pt given water 

## 2018-11-25 NOTE — ED Notes (Signed)
Pt aware of plan and understands delay to floor room.

## 2018-11-25 NOTE — ED Notes (Signed)
ICU2 states to hold report as pt's assigned room may change momentarily.

## 2018-11-25 NOTE — ED Notes (Signed)
Called lab about Trop and BMP results not posted yet.

## 2018-11-25 NOTE — ED Notes (Signed)
BG 190

## 2018-11-25 NOTE — ED Notes (Signed)
BG 281.

## 2018-11-25 NOTE — ED Notes (Signed)
Potassium rate reduced to 50 as pt couldn't tolerate it running at 75.

## 2018-11-25 NOTE — ED Notes (Signed)
MD messaged about diet for pt.

## 2018-11-25 NOTE — ED Notes (Signed)
BG 201

## 2018-11-25 NOTE — ED Notes (Signed)
RRT at bedside collecting ABG.

## 2018-11-25 NOTE — H&P (Signed)
[facility] NAME: Christopher [name]    MR#:  [id number]  DATE OF BIRTH:  Jul 11, 1966  DATE OF ADMISSION:  11/25/2018  PRIMARY CARE PHYSICIAN: Patient, No Pcp Per   REQUESTING/REFERRING PHYSICIAN: Williams  CHIEF COMPLAINT:  High sugar with nausea  HISTORY OF PRESENT ILLNESS:  Christopher [name]  is a 53 y.o. male with a known history of diabetes mellitus, hyperlipidemia, hypertension, diabetic neuropathy, anxiety, prostate cancer and multiple other medical problems is presenting to the ED with a chief complaint of nausea vomiting associated with generalized weakness for the past 2 days.  Patient sugars were very high for the past 48 hours.  Also having some cough and shortness of breath and patient reports fever and chills.  Respiratory panel and flu test were ordered in the ED.  Patient is started on insulin drip for DKA as beta hydroxybutyric acid is elevated.  Intensivist notified.  Troponin is elevated and the ED physician talked to Dr. Humphrey Rolls cardiology.  Patient is resting comfortably during my examination denies any chest pain or shortness of breath or nausea or vomiting.  Reporting cough and fever  PAST MEDICAL HISTORY:   Past Medical History:  Diagnosis Date  . Anxiety   . Arthritis   . Cancer [facility])    Prostate  . Diabetes mellitus without complication ([place])   . Hyperlipidemia   . Hypertension   . Neuropathy   . Neuropathy   . Sleep apnea     PAST SURGICAL HISTOIRY:   Past Surgical History:  Procedure Laterality Date  . APPENDECTOMY    . PROSTATECTOMY      SOCIAL HISTORY:   Social History   Tobacco Use  . Smoking status: Never Smoker  . Smokeless tobacco: Never Used  Substance Use Topics  . Alcohol use: No    FAMILY HISTORY:   Family History  Problem Relation Age of Onset  . Cancer Father   . Stroke Father     DRUG ALLERGIES:   Allergies  Allergen Reactions  . Tramadol Nausea And Vomiting     REVIEW OF SYSTEMS:  CONSTITUTIONAL: No fever, fatigue.  Reports weakness.  EYES: No blurred or double vision.  EARS, NOSE, AND THROAT: No tinnitus or ear pain.  RESPIRATORY: Reports cough, exertional shortness of breath, denies wheezing or hemoptysis.  CARDIOVASCULAR: No chest pain, orthopnea, edema.  GASTROINTESTINAL: No nausea, vomiting, diarrhea or abdominal pain.  GENITOURINARY: No dysuria, hematuria.  ENDOCRINE: No polyuria, nocturia,  HEMATOLOGY: No anemia, easy bruising or bleeding SKIN: No rash or lesion. MUSCULOSKELETAL: No joint pain or arthritis.   NEUROLOGIC: No tingling, numbness, weakness.  PSYCHIATRY: No anxiety or depression.   MEDICATIONS AT HOME:   Prior to Admission medications   Medication Sig Start Date End Date Taking? Authorizing Provider  amLODipine (NORVASC) 5 MG tablet Take 1 tablet (5 mg total) by mouth daily. 02/02/17  Yes Bettey Costa, MD  aspirin EC 81 MG tablet Take 1 tablet (81 mg total) by mouth daily. 02/02/17  Yes Mody, Ulice Bold, MD  atorvastatin (LIPITOR) 20 MG tablet Take 20 mg by mouth daily at 6 PM.   Yes [provider]  cetirizine (ZYRTEC) 10 MG tablet Take 1 tablet (10 mg total) by mouth daily. 02/02/17  Yes Bettey Costa, MD  cyclobenzaprine (FLEXERIL) 10 MG tablet Take 1 tablet (10 mg total) by mouth 3 (three) times daily as needed for muscle spasms. 07/06/15  Yes Demetrios Loll, MD  DULoxetine (CYMBALTA) 30  MG capsule Take 1 capsule (30 mg total) by mouth daily. 02/02/17  Yes Mody, Ulice Bold, MD  fluticasone (FLONASE) 50 MCG/ACT nasal spray Place 2 sprays into both nostrils daily. 01/10/15 11/25/18 Yes Summers, Rhonda L, PA-C  hydrochlorothiazide (HYDRODIURIL) 25 MG tablet Take 1 tablet (25 mg total) by mouth daily. 02/02/17  Yes Mody, Ulice Bold, MD  insulin aspart (NOVOLOG) 100 UNIT/ML FlexPen Inject 5-20 Units into the skin 3 (three) times daily with meals. Sliding scale 01/22/14  Yes [provider]  insulin glargine (LANTUS) 100 UNIT/ML  injection Inject 0.6 mLs (60 Units total) into the skin 2 (two) times daily. 02/02/17 11/25/18 Yes Mody, Ulice Bold, MD  lisinopril (PRINIVIL,ZESTRIL) 10 MG tablet Take 1 tablet (10 mg total) by mouth daily. 02/02/17  Yes Mody, Ulice Bold, MD  LORazepam (ATIVAN) 0.5 MG tablet Take 1 tablet (0.5 mg total) by mouth daily. 02/02/17  Yes Mody, Ulice Bold, MD  nystatin cream (MYCOSTATIN) Apply topically 2 (two) times daily. 02/02/17  Yes Mody, Ulice Bold, MD  pregabalin (LYRICA) 100 MG capsule Take 1 capsule (100 mg total) by mouth 2 (two) times daily. 02/02/17  Yes Mody, Ulice Bold, MD  terazosin (HYTRIN) 1 MG capsule Take 1 capsule (1 mg total) by mouth daily. 02/02/17  Yes Bettey Costa, MD  oxyCODONE-acetaminophen (PERCOCET/ROXICET) 5-325 MG tablet Take 2 tablets by mouth every 4 (four) hours as needed for severe pain. Patient not taking: Reported on 11/25/2018 02/16/18   Loney Hering, MD  pregabalin (LYRICA) 50 MG capsule Take 1 capsule (50 mg total) by mouth 3 (three) times daily. 07/20/17 07/20/18  Gregor Hams, MD      VITAL SIGNS:  Blood pressure 111/75, pulse 88, temperature 99.6 F (37.6 C), temperature source Oral, resp. rate (!) 21, height 5\' 9"  (1.753 m), weight 72.6 kg, SpO2 97 %.  PHYSICAL EXAMINATION:  GENERAL:  53 y.o.-year-old patient lying in the bed with no acute distress.  EYES: Pupils equal, round, reactive to light and accommodation. No scleral icterus. Extraocular muscles intact.  HEENT: Head atraumatic, normocephalic. Oropharynx and nasopharynx clear.  NECK:  Supple, no jugular venous distention. No thyroid enlargement, no tenderness.  LUNGS: Normal breath sounds bilaterally, no wheezing, rales,rhonchi or crepitation. No use of accessory muscles of respiration.  CARDIOVASCULAR: S1, S2 normal. No murmurs, rubs, or gallops.  ABDOMEN: Soft, nontender, nondistended. Bowel sounds present.  EXTREMITIES: No pedal edema, cyanosis, or clubbing.  NEUROLOGIC: Cranial nerves II through XII are intact.  Awake,  alert and oriented x3 sensation intact. Gait not checked.  PSYCHIATRIC: The patient is alert and oriented x 3.  SKIN: No obvious rash, lesion, or ulcer.   LABORATORY PANEL:   CBC Recent Labs  Lab 11/25/18 1203  WBC 14.7*  HGB 12.0*  HCT 37.5*  PLT 333   ------------------------------------------------------------------------------------------------------------------  Chemistries  Recent Labs  Lab 11/25/18 1203  NA 130*  K 4.2  CL 90*  CO2 23  GLUCOSE 540*  BUN 8  CREATININE 0.99  CALCIUM 9.5  AST 12*  ALT 13  ALKPHOS 123  BILITOT 1.1   ------------------------------------------------------------------------------------------------------------------  Cardiac Enzymes Recent Labs  Lab 11/25/18 1203  TROPONINI 0.08*   ------------------------------------------------------------------------------------------------------------------  RADIOLOGY:  Dg Chest 1 View  Result Date: 11/25/2018 CLINICAL DATA:  Nausea and vomiting. Generalized weakness for the past 2 days. EXAM: CHEST  1 VIEW COMPARISON:  None. FINDINGS: Normal cardiac silhouette and mediastinal contours. Left basilar heterogeneous opacities. No discrete focal airspace opacities. No pleural effusion or pneumothorax. No evidence of edema. No acute osseous abnormalities.  Degenerative change of the bilateral acromioclavicular joints. IMPRESSION: Left basilar heterogeneous opacities may represent atelectasis or scarring though developing airspace opacity could have a similar appearance. Further evaluation with a PA and lateral chest radiograph may be obtained as clinically indicated. Electronically Signed   By: Sandi Mariscal M.D.   On: 11/25/2018 12:23    EKG:   Orders placed or performed during the hospital encounter of 11/25/18  . EKG 12-Lead  . EKG 12-Lead    IMPRESSION AND PLAN:   #DKA Admit to intensive care unit Aggressive hydration with IV fluids, insulin drip N.p.o. Serial BMPs  #Pseudohyponatremia  from hyperglycemia Should be auto corrected with insulin drip and aggressive hydration with IV fluids  #Pneumonia-seems to be community-acquired Rocephin and azithromycin sputum culture and sensitivity Check respiratory panel flu test  #Elevated troponin probably demand ischemia Telemetry monitoring Cycle cardiac biomarkers Patient is asymptomatic Cardiology consult placed to Dr. Arley Phenix physician notified  #Diabetic neuropathy we will resume Lyrica  All the records are reviewed and case discussed with ED provider. Management plans discussed with the patient, family and they are in agreement.  CODE STATUS: fc   TOTAL CRITICAL CARE TIME TAKING CARE OF THIS PATIENT: 45  minutes.   Note: This dictation was prepared with Dragon dictation along with smaller phrase technology. Any transcriptional errors that result from this process are unintentional.  Nicholes Mango M.D on 11/25/2018 at 3:30 PM  Between 7am to 6pm - Pager - [phone]  After 6pm go to [email or web address] - password [email or web address] [facility]  Office  [phone]  CC: Primary care physician; Patient, No Pcp Per

## 2018-11-25 NOTE — ED Notes (Signed)
Lab states blood draws will result soon.

## 2018-11-25 NOTE — ED Notes (Signed)
Pt up to use bedside toilet.

## 2018-11-25 NOTE — ED Notes (Signed)
Floor will call this RN back; given ascom #; state they want clarification on certain orders from MD first.

## 2018-11-25 NOTE — ED Notes (Signed)
Pt given blanket. No other needs voiced at this time.

## 2018-11-25 NOTE — Consult Note (Signed)
Christopher [name] is a 53 y.o. male  [id number]  Primary Cardiologist: New patient to Dr. Neoma Laming Reason for Consultation: Elevated troponin, EKG changes  HPI:  53 y.o. male with a past medical history of anxiety, arthritis, prostate cancer, diabetes, hyperlipidemia, hypertension presented to the ED for nausea vomiting with generalized weakness for the past 2 days.  Patient is also noted hyperglycemia for the past 48 hours and reports he has had fever and chills. He was diagnosed with DKA, chest xray showed opacities and troponin was noted to be mildly elevated with nonspecific T wave changes on EKG.    Review of Systems: No chest pain. Has nausea and vomiting.   Past Medical History:  Diagnosis Date  . Anxiety   . Arthritis   . Cancer [facility])    Prostate  . Diabetes mellitus without complication ([place])   . Hyperlipidemia   . Hypertension   . Neuropathy   . Neuropathy   . Sleep apnea     (Not in a hospital admission)    . [START ON 11/26/2018] amLODipine  5 mg Oral Daily  . [START ON 11/26/2018] aspirin EC  81 mg Oral Daily  . [START ON 11/26/2018] azithromycin  250 mg Oral Daily  . [START ON 11/26/2018] DULoxetine  30 mg Oral Daily  . enoxaparin (LOVENOX) injection  30 mg Subcutaneous Q24H  . [START ON 11/26/2018] fluticasone  2 spray Each Nare Daily  . insulin regular  0-10 Units Intravenous TID WC  . [START ON 11/26/2018] lisinopril  10 mg Oral Daily  . [START ON 11/26/2018] loratadine  10 mg Oral Daily  . LORazepam  0.5 mg Oral Daily  . nystatin cream   Topical BID  . pregabalin  100 mg Oral BID  . [START ON 11/26/2018] terazosin  1 mg Oral Daily    Infusions: . sodium chloride Stopped (11/25/18 1826)  . sodium chloride    . sodium chloride Stopped (11/25/18 2203)  . [START ON 11/26/2018] cefTRIAXone (ROCEPHIN)  IV    . dextrose 5 % and 0.45% NaCl Stopped (11/25/18 2131)  . dextrose 5 % and 0.45% NaCl    . insulin Stopped (11/25/18 2129)  . insulin    . potassium chloride       Allergies  Allergen Reactions  . Tramadol Nausea And Vomiting    Social History   Socioeconomic History  . Marital status: Single    Spouse name: Not on file  . Number of children: Not on file  . Years of education: Not on file  . Highest education level: Not on file  Occupational History  . Not on file  Social Needs  . Financial resource strain: Not on file  . Food insecurity:    Worry: Not on file    Inability: Not on file  . Transportation needs:    Medical: Not on file    Non-medical: Not on file  Tobacco Use  . Smoking status: Never Smoker  . Smokeless tobacco: Never Used  Substance and Sexual Activity  . Alcohol use: No  . Drug use: No  . Sexual activity: Not on file  Lifestyle  . Physical activity:    Days per week: Not on file    Minutes per session: Not on file  . Stress: Not on file  Relationships  . Social connections:    Talks on phone: Not on file    Gets together: Not on file    Attends religious service: Not on file  Active member of club or organization: Not on file    Attends meetings of clubs or organizations: Not on file    Relationship status: Not on file  . Intimate partner violence:    Fear of current or ex partner: Not on file    Emotionally abused: Not on file    Physically abused: Not on file    Forced sexual activity: Not on file  Other Topics Concern  . Not on file  Social History Narrative  . Not on file    Family History  Problem Relation Age of Onset  . Cancer Father   . Stroke Father     PHYSICAL EXAM: Vitals:   11/25/18 2100 11/25/18 2130  BP: 122/81 130/85  Pulse: 83 87  Resp: (!) 21 15  Temp:    SpO2: 95% 98%     Intake/Output Summary (Last 24 hours) at 11/25/2018 2220 Last data filed at 11/25/2018 2000 Gross per 24 hour  Intake 1020 ml  Output 1150 ml  Net -130 ml    General:  Well appearing. No respiratory difficulty HEENT: normal Neck: supple. no JVD. Carotids 2+ bilat; no bruits. No  lymphadenopathy or thryomegaly appreciated. Cor: PMI nondisplaced. Regular rate & rhythm. No rubs, gallops or murmurs. Lungs: clear Abdomen: soft, nontender, nondistended. No hepatosplenomegaly. No bruits or masses. Good bowel sounds. Extremities: no cyanosis, clubbing, rash, edema Neuro: alert & oriented x 3, cranial nerves grossly intact. moves all 4 extremities w/o difficulty. Affect pleasant.  ECG: Sinus tachycardia 104bpm, nonspecific t wave changes, Probable left atrial enlargement Left ventricular hypertrophy  Results for orders placed or performed during the hospital encounter of 11/25/18 (from the past 24 hour(s))  Glucose, capillary     Status: Abnormal   Collection Time: 11/25/18 11:59 AM  Result Value Ref Range   Glucose-Capillary 493 (H) 70 - 99 mg/dL  CBC with Differential     Status: Abnormal   Collection Time: 11/25/18 12:03 PM  Result Value Ref Range   WBC 14.7 (H) 4.0 - 10.5 K/uL   RBC 4.45 4.22 - 5.81 MIL/uL   Hemoglobin 12.0 (L) 13.0 - 17.0 g/dL   HCT 37.5 (L) 39.0 - 52.0 %   MCV 84.3 80.0 - 100.0 fL   MCH 27.0 26.0 - 34.0 pg   MCHC 32.0 30.0 - 36.0 g/dL   RDW 12.0 11.5 - 15.5 %   Platelets 333 150 - 400 K/uL   nRBC 0.0 0.0 - 0.2 %   Neutrophils Relative % 82 %   Neutro Abs 12.1 (H) 1.7 - 7.7 K/uL   Lymphocytes Relative 7 %   Lymphs Abs 1.1 0.7 - 4.0 K/uL   Monocytes Relative 10 %   Monocytes Absolute 1.5 (H) 0.1 - 1.0 K/uL   Eosinophils Relative 0 %   Eosinophils Absolute 0.0 0.0 - 0.5 K/uL   Basophils Relative 0 %   Basophils Absolute 0.0 0.0 - 0.1 K/uL   WBC Morphology TOXIC GRANULATION    RBC Morphology MORPHOLOGY UNREMARKABLE    Smear Review MORPHOLOGY UNREMARKABLE    Immature Granulocytes 1 %   Abs Immature Granulocytes 0.08 (H) 0.00 - 0.07 K/uL  Comprehensive metabolic panel     Status: Abnormal   Collection Time: 11/25/18 12:03 PM  Result Value Ref Range   Sodium 130 (L) 135 - 145 mmol/L   Potassium 4.2 3.5 - 5.1 mmol/L   Chloride 90 (L) 98  - 111 mmol/L   CO2 23 22 - 32 mmol/L   Glucose,  Bld 540 (HH) 70 - 99 mg/dL   BUN 8 6 - 20 mg/dL   Creatinine, Ser 0.99 0.61 - 1.24 mg/dL   Calcium 9.5 8.9 - 10.3 mg/dL   Total Protein 8.3 (H) 6.5 - 8.1 g/dL   Albumin 3.3 (L) 3.5 - 5.0 g/dL   AST 12 (L) 15 - 41 U/L   ALT 13 0 - 44 U/L   Alkaline Phosphatase 123 38 - 126 U/L   Total Bilirubin 1.1 0.3 - 1.2 mg/dL   GFR calc non Af Amer >60 >60 mL/min   GFR calc Af Amer >60 >60 mL/min   Anion gap 17 (H) 5 - 15  Lipase, blood     Status: None   Collection Time: 11/25/18 12:03 PM  Result Value Ref Range   Lipase 25 11 - 51 U/L  Urinalysis, Complete w Microscopic     Status: Abnormal   Collection Time: 11/25/18 12:03 PM  Result Value Ref Range   Color, Urine STRAW (A) YELLOW   APPearance CLEAR (A) CLEAR   Specific Gravity, Urine 1.028 1.005 - 1.030   pH 5.0 5.0 - 8.0   Glucose, UA >=500 (A) NEGATIVE mg/dL   Hgb urine dipstick MODERATE (A) NEGATIVE   Bilirubin Urine NEGATIVE NEGATIVE   Ketones, ur 20 (A) NEGATIVE mg/dL   Protein, ur NEGATIVE NEGATIVE mg/dL   Nitrite NEGATIVE NEGATIVE   Leukocytes,Ua NEGATIVE NEGATIVE   RBC / HPF 0-5 0 - 5 RBC/hpf   WBC, UA NONE SEEN 0 - 5 WBC/hpf   Bacteria, UA NONE SEEN NONE SEEN   Squamous Epithelial / LPF NONE SEEN 0 - 5  Beta-hydroxybutyric acid     Status: Abnormal   Collection Time: 11/25/18 12:03 PM  Result Value Ref Range   Beta-Hydroxybutyric Acid 2.44 (H) 0.05 - 0.27 mmol/L  Troponin I - ONCE - STAT     Status: Abnormal   Collection Time: 11/25/18 12:03 PM  Result Value Ref Range   Troponin I 0.08 (HH) <0.03 ng/mL  Blood gas, venous     Status: Abnormal   Collection Time: 11/25/18 12:07 PM  Result Value Ref Range   pH, Ven 7.41 7.250 - 7.430   pCO2, Ven 38 (L) 44.0 - 60.0 mmHg   pO2, Ven 42.0 32.0 - 45.0 mmHg   Bicarbonate 24.1 20.0 - 28.0 mmol/L   Acid-base deficit 0.3 0.0 - 2.0 mmol/L   O2 Saturation 78.0 %   Patient temperature 37.0    Collection site VEIN    Sample type  VENOUS   Influenza panel by PCR (type A & B)     Status: None   Collection Time: 11/25/18  2:29 PM  Result Value Ref Range   Influenza A By PCR NEGATIVE NEGATIVE   Influenza B By PCR NEGATIVE NEGATIVE  Glucose, capillary     Status: Abnormal   Collection Time: 11/25/18  2:52 PM  Result Value Ref Range   Glucose-Capillary 422 (H) 70 - 99 mg/dL  Glucose, capillary     Status: Abnormal   Collection Time: 11/25/18  3:52 PM  Result Value Ref Range   Glucose-Capillary 385 (H) 70 - 99 mg/dL  Blood gas, arterial     Status: Abnormal (Preliminary result)   Collection Time: 11/25/18  4:53 PM  Result Value Ref Range   pH, Arterial 7.45 7.350 - 7.450   pCO2 arterial 40 32.0 - 48.0 mmHg   pO2, Arterial 75 (L) 83.0 - 108.0 mmHg   Bicarbonate 27.8 20.0 -  28.0 mmol/L   Acid-Base Excess 3.5 (H) 0.0 - 2.0 mmol/L   O2 Saturation 95.5 %   Patient temperature 37.0    Collection site REVIEWED BY    Sample type ARTERIAL DRAW    Allens test (pass/fail) PASS PASS   Mechanical Rate PENDING   Glucose, capillary     Status: Abnormal   Collection Time: 11/25/18  5:08 PM  Result Value Ref Range   Glucose-Capillary 281 (H) 70 - 99 mg/dL  Comprehensive metabolic panel     Status: Abnormal   Collection Time: 11/25/18  5:12 PM  Result Value Ref Range   Sodium 129 (L) 135 - 145 mmol/L   Potassium 3.6 3.5 - 5.1 mmol/L   Chloride 95 (L) 98 - 111 mmol/L   CO2 24 22 - 32 mmol/L   Glucose, Bld 287 (H) 70 - 99 mg/dL   BUN 8 6 - 20 mg/dL   Creatinine, Ser 0.89 0.61 - 1.24 mg/dL   Calcium 9.2 8.9 - 10.3 mg/dL   Total Protein 8.2 (H) 6.5 - 8.1 g/dL   Albumin 2.9 (L) 3.5 - 5.0 g/dL   AST 14 (L) 15 - 41 U/L   ALT 10 0 - 44 U/L   Alkaline Phosphatase 96 38 - 126 U/L   Total Bilirubin 0.6 0.3 - 1.2 mg/dL   GFR calc non Af Amer >60 >60 mL/min   GFR calc Af Amer >60 >60 mL/min   Anion gap 10 5 - 15  Troponin I - Once-Timed     Status: Abnormal   Collection Time: 11/25/18  5:12 PM  Result Value Ref Range    Troponin I 0.05 (HH) <0.03 ng/mL  Procalcitonin - Baseline     Status: None   Collection Time: 11/25/18  5:12 PM  Result Value Ref Range   Procalcitonin 2.30 ng/mL  Glucose, capillary     Status: Abnormal   Collection Time: 11/25/18  6:22 PM  Result Value Ref Range   Glucose-Capillary 201 (H) 70 - 99 mg/dL  Glucose, capillary     Status: Abnormal   Collection Time: 11/25/18  7:29 PM  Result Value Ref Range   Glucose-Capillary 217 (H) 70 - 99 mg/dL  Glucose, capillary     Status: Abnormal   Collection Time: 11/25/18  8:55 PM  Result Value Ref Range   Glucose-Capillary 190 (H) 70 - 99 mg/dL   Dg Chest 1 View  Result Date: 11/25/2018 CLINICAL DATA:  Nausea and vomiting. Generalized weakness for the past 2 days. EXAM: CHEST  1 VIEW COMPARISON:  None. FINDINGS: Normal cardiac silhouette and mediastinal contours. Left basilar heterogeneous opacities. No discrete focal airspace opacities. No pleural effusion or pneumothorax. No evidence of edema. No acute osseous abnormalities. Degenerative change of the bilateral acromioclavicular joints. IMPRESSION: Left basilar heterogeneous opacities may represent atelectasis or scarring though developing airspace opacity could have a similar appearance. Further evaluation with a PA and lateral chest radiograph may be obtained as clinically indicated. Electronically Signed   By: Sandi Mariscal M.D.   On: 11/25/2018 12:23     ASSESSMENT AND PLAN: Elevated troponin is likely due to demand ischemia secondary to DKA and probable pneumonia. Trend troponin and continue to treat DKA and pneumonia. Consider echo for LVH and continue to monitor.   Francesa Eugenio A

## 2018-11-25 NOTE — ED Notes (Addendum)
ED TO INPATIENT HANDOFF REPORT  ED Nurse Name and Phone #: Metta Clines [phone]  S Name/Age/Gender Christopher [name] 53 y.o. male Room/Bed: ED24A/ED24A  Code Status   Code Status: Prior  Home/SNF/Other Home Patient oriented to: self, place, time and situation Is this baseline? Yes   Triage Complete: Triage complete  Chief Complaint nausea vomiting ems  Triage Note Pt in via [facility] from home, reports N/V, generalized weakness x 2 days.  Per EMS, pt CBG 524.  4mg  Zofran and approximately 300cc NaCl bolus given prior to arrival.  NAD noted at this time.   Allergies Allergies  Allergen Reactions  . Tramadol Nausea And Vomiting    Level of Care/Admitting Diagnosis ED Disposition    ED Disposition Condition [facility] Area: [place] [[id number]]  Level of Care: ICU [6]  Diagnosis: DKA (diabetic ketoacidoses) ([place]) [[id number]]  Admitting Physician: Nicholes Mango [[id number]]  Attending Physician: Nicholes Mango [[id number]]  Estimated length of stay: 3 - 4 days  Certification:: I certify this patient will need inpatient services for at least 2 midnights  Bed request comments: ICU  PT Class (Do Not Modify): Inpatient [101]  PT Acc Code (Do Not Modify): Private [1]       B Medical/Surgery History Past Medical History:  Diagnosis Date  . Anxiety   . Arthritis   . Cancer [facility])    Prostate  . Diabetes mellitus without complication ([place])   . Hyperlipidemia   . Hypertension   . Neuropathy   . Neuropathy   . Sleep apnea    Past Surgical History:  Procedure Laterality Date  . APPENDECTOMY    . PROSTATECTOMY       A IV Location/Drains/Wounds Patient Lines/Drains/Airways Status   Active Line/Drains/Airways    Name:   Placement date:   Placement time:   Site:   Days:   Peripheral IV 11/25/18 Left Antecubital   11/25/18    1159    Antecubital   less than 1   Peripheral IV 11/25/18 Right Forearm   11/25/18    1437    Forearm   less than 1           Intake/Output Last 24 hours  Intake/Output Summary (Last 24 hours) at 11/25/2018 1522 Last data filed at 11/25/2018 1311 Gross per 24 hour  Intake 540 ml  Output 300 ml  Net 240 ml    Labs/Imaging Results for orders placed or performed during the hospital encounter of 11/25/18 (from the past 48 hour(s))  Glucose, capillary     Status: Abnormal   Collection Time: 11/25/18 11:59 AM  Result Value Ref Range   Glucose-Capillary 493 (H) 70 - 99 mg/dL  CBC with Differential     Status: Abnormal   Collection Time: 11/25/18 12:03 PM  Result Value Ref Range   WBC 14.7 (H) 4.0 - 10.5 K/uL   RBC 4.45 4.22 - 5.81 MIL/uL   Hemoglobin 12.0 (L) 13.0 - 17.0 g/dL   HCT 37.5 (L) 39.0 - 52.0 %   MCV 84.3 80.0 - 100.0 fL   MCH 27.0 26.0 - 34.0 pg   MCHC 32.0 30.0 - 36.0 g/dL   RDW 12.0 11.5 - 15.5 %   Platelets 333 150 - 400 K/uL   nRBC 0.0 0.0 - 0.2 %   Neutrophils Relative % 82 %   Neutro Abs 12.1 (H) 1.7 - 7.7 K/uL   Lymphocytes Relative 7 %   Lymphs Abs 1.1 0.7 - 4.0 K/uL  Monocytes Relative 10 %   Monocytes Absolute 1.5 (H) 0.1 - 1.0 K/uL   Eosinophils Relative 0 %   Eosinophils Absolute 0.0 0.0 - 0.5 K/uL   Basophils Relative 0 %   Basophils Absolute 0.0 0.0 - 0.1 K/uL   WBC Morphology TOXIC GRANULATION    RBC Morphology MORPHOLOGY UNREMARKABLE    Smear Review MORPHOLOGY UNREMARKABLE    Immature Granulocytes 1 %   Abs Immature Granulocytes 0.08 (H) 0.00 - 0.07 K/uL    Comment: Performed at [facility], [address] [place], [place] [address]  Comprehensive metabolic panel     Status: Abnormal   Collection Time: 11/25/18 12:03 PM  Result Value Ref Range   Sodium 130 (L) 135 - 145 mmol/L   Potassium 4.2 3.5 - 5.1 mmol/L   Chloride 90 (L) 98 - 111 mmol/L   CO2 23 22 - 32 mmol/L   Glucose, Bld 540 (HH) 70 - 99 mg/dL    Comment: CRITICAL RESULT CALLED TO, READ BACK BY AND VERIFIED WITH  HEATHER FISHER AT 1313 11/25/18 SDR    BUN 8 6 - 20 mg/dL   Creatinine, Ser  0.99 0.61 - 1.24 mg/dL   Calcium 9.5 8.9 - 10.3 mg/dL   Total Protein 8.3 (H) 6.5 - 8.1 g/dL   Albumin 3.3 (L) 3.5 - 5.0 g/dL   AST 12 (L) 15 - 41 U/L   ALT 13 0 - 44 U/L   Alkaline Phosphatase 123 38 - 126 U/L   Total Bilirubin 1.1 0.3 - 1.2 mg/dL   GFR calc non Af Amer >60 >60 mL/min   GFR calc Af Amer >60 >60 mL/min   Anion gap 17 (H) 5 - 15    Comment: Performed at [facility], [address] [place], [place] [address]  Lipase, blood     Status: None   Collection Time: 11/25/18 12:03 PM  Result Value Ref Range   Lipase 25 11 - 51 U/L    Comment: Performed at [facility], [address] [place], [place] [address]  Urinalysis, Complete w Microscopic     Status: Abnormal   Collection Time: 11/25/18 12:03 PM  Result Value Ref Range   Color, Urine STRAW (A) YELLOW   APPearance CLEAR (A) CLEAR   Specific Gravity, Urine 1.028 1.005 - 1.030   pH 5.0 5.0 - 8.0   Glucose, UA >=500 (A) NEGATIVE mg/dL   Hgb urine dipstick MODERATE (A) NEGATIVE   Bilirubin Urine NEGATIVE NEGATIVE   Ketones, ur 20 (A) NEGATIVE mg/dL   Protein, ur NEGATIVE NEGATIVE mg/dL   Nitrite NEGATIVE NEGATIVE   Leukocytes,Ua NEGATIVE NEGATIVE   RBC / HPF 0-5 0 - 5 RBC/hpf   WBC, UA NONE SEEN 0 - 5 WBC/hpf   Bacteria, UA NONE SEEN NONE SEEN   Squamous Epithelial / LPF NONE SEEN 0 - 5    Comment: Performed at [facility], [address] [place], [place] [address]  Beta-hydroxybutyric acid     Status: Abnormal   Collection Time: 11/25/18 12:03 PM  Result Value Ref Range   Beta-Hydroxybutyric Acid 2.44 (H) 0.05 - 0.27 mmol/L    Comment: Performed at [facility] [address] [place], [place] [address]  Troponin I - ONCE - STAT     Status: Abnormal   Collection Time: 11/25/18 12:03 PM  Result Value Ref Range   Troponin I 0.08 (HH) <0.03 ng/mL    Comment: CRITICAL RESULT CALLED TO, READ BACK BY  [place] AT 8182 11/25/18 SDR Performed at  [facility], [address] [place], [place] [address]   Blood gas, venous     Status: Abnormal   Collection Time: 11/25/18 12:07 PM  Result Value Ref Range   pH, Ven 7.41 7.250 - 7.430   pCO2, Ven 38 (L) 44.0 - 60.0 mmHg   pO2, Ven 42.0 32.0 - 45.0 mmHg   Bicarbonate 24.1 20.0 - 28.0 mmol/L   Acid-base deficit 0.3 0.0 - 2.0 mmol/L   O2 Saturation 78.0 %   Patient temperature 37.0    Collection site VEIN    Sample type VENOUS     Comment: Performed at [facility], [address] [place],  [address]  Glucose, capillary     Status: Abnormal   Collection Time: 11/25/18  2:52 PM  Result Value Ref Range   Glucose-Capillary 422 (H) 70 - 99 mg/dL   Dg Chest 1 View  Result Date: 11/25/2018 CLINICAL DATA:  Nausea and vomiting. Generalized weakness for the past 2 days. EXAM: CHEST  1 VIEW COMPARISON:  None. FINDINGS: Normal cardiac silhouette and mediastinal contours. Left basilar heterogeneous opacities. No discrete focal airspace opacities. No pleural effusion or pneumothorax. No evidence of edema. No acute osseous abnormalities. Degenerative change of the bilateral acromioclavicular joints. IMPRESSION: Left basilar heterogeneous opacities may represent atelectasis or scarring though developing airspace opacity could have a similar appearance. Further evaluation with a PA and lateral chest radiograph may be obtained as clinically indicated. Electronically Signed   By: Sandi Mariscal M.D.   On: 11/25/2018 12:23    Pending Labs Unresulted Labs (From admission, onward)    Start     Ordered   11/26/18 0500  CBC  Tomorrow morning,   STAT     11/25/18 1438   11/25/18 1800  Troponin I - Once-Timed  Once-Timed,   STAT     11/25/18 1519   11/25/18 1439  Comprehensive metabolic panel  Once,   STAT     11/25/18 1438   11/25/18 1437  Troponin I - Now Then Q6H  Now then every 6 hours,   STAT     11/25/18 1436   11/25/18 1336  Respiratory Panel by PCR  (Respiratory virus  panel with precautions)  Once,   STAT     11/25/18 1335   11/25/18 1334  Novel Coronavirus, NAA (hospital order; send-out to ref lab)  (Novel Coronavirus, NAA [facility] Order; send-out to ref lab) with precautions panel)  Once,   STAT    Question Answer Comment  Current symptoms Fever and Cough   Excluded other viral illnesses No (testing not indicated)   Patient immune status Immunocompromised      11/25/18 1333   11/25/18 1334  Influenza panel by PCR (type A & B)  (Influenza PCR Panel)  Once,   STAT     11/25/18 1335   Signed and Held  HIV antibody (Routine Testing)  Once,   R     Signed and Held   Signed and Held  Basic metabolic panel  STAT Now then every 4 hours ,   STAT     Signed and Held   Signed and Held  CBC  (enoxaparin (LOVENOX)    CrCl < 30 ml/min)  Once,   R    Comments:  Baseline for enoxaparin therapy IF NOT ALREADY DRAWN.  Notify MD if PLT < 100 K.    Signed and Held  Signed and Held  Creatinine, serum  (enoxaparin (LOVENOX)    CrCl < 30 ml/min)  Once,   R    Comments:  Baseline for enoxaparin therapy IF NOT ALREADY DRAWN.    Signed and Held   Signed and Held  Creatinine, serum  (enoxaparin (LOVENOX)    CrCl < 30 ml/min)  Weekly,   R    Comments:  while on enoxaparin therapy.    Signed and Held          Vitals/Pain Today's Vitals   11/25/18 1158 11/25/18 1230 11/25/18 1330 11/25/18 1439  BP: (!) 135/91 123/78 123/84 111/75  Pulse: (!) 107 (!) 102 95 91  Resp: 16 19 (!) 22 (!) 21  Temp: 99.6 F (37.6 C)     TempSrc: Oral     SpO2: 95% 93% 96% 95%  Weight: 72.6 kg     Height: 5\' 9"  (1.753 m)     PainSc:        Isolation Precautions Droplet precaution  Medications Medications  levofloxacin (LEVAQUIN) IVPB 750 mg (750 mg Intravenous New Bag/Given 11/25/18 1434)  dextrose 5 %-0.45 % sodium chloride infusion (has no administration in time range)  insulin regular bolus via infusion 0-10 Units (has no administration in time range)  insulin regular, human  (MYXREDLIN) 100 units/ 100 mL infusion (3.6 Units/hr Intravenous New Bag/Given 11/25/18 1456)  dextrose 50 % solution 25 mL (has no administration in time range)  0.9 %  sodium chloride infusion ( Intravenous New Bag/Given 11/25/18 1439)  cefTRIAXone (ROCEPHIN) 1 g in sodium chloride 0.9 % 100 mL IVPB (has no administration in time range)  azithromycin (ZITHROMAX) tablet 500 mg (has no administration in time range)    Followed by  azithromycin (ZITHROMAX) tablet 250 mg (has no administration in time range)  morphine 4 MG/ML injection 4 mg (4 mg Intravenous Given 11/25/18 1312)  ondansetron (ZOFRAN) injection 4 mg (4 mg Intravenous Given 11/25/18 1312)    Mobility Usually walks  Low fall risk   Focused Assessments  DM with BG in 500s, Trop 0.08, N/V, weakness, cough    R Recommendations: See Admitting Provider Note  Report given to:   Additional Notes:  Fluids running with insulin gtt, on contact/droplet precautions until COVID-19 test back, Levaquin running, 2nd Trop to be collected at 1800 (Q6hrs), L ac 18g, R fa 20g. NSR, VSS, RA.

## 2018-11-25 NOTE — ED Notes (Signed)
Will hang K+ x2 once received from pharm.

## 2018-11-25 NOTE — ED Notes (Signed)
Per policy, will stop insulin gtt 2hrs post Lantus.

## 2018-11-25 NOTE — ED Notes (Signed)
Pt given ED carb mod food tray and diet drink.

## 2018-11-25 NOTE — ED Notes (Signed)
Potassium rate dec to 75 as pt couldn't tolerate burning sensation.

## 2018-11-25 NOTE — ED Notes (Signed)
BG 385.

## 2018-11-25 NOTE — ED Notes (Signed)
Pt resting in bed watching tv. Denies any needs.

## 2018-11-25 NOTE — ED Provider Notes (Addendum)
[facility] Emergency Department Provider Note       Time seen: ----------------------------------------- 12:01 PM on 11/25/2018 -----------------------------------------   I have reviewed the triage vital signs and the nursing notes.  HISTORY   Chief Complaint Emesis and Hyperglycemia    HPI Christopher [name] is a 53 y.o. male with a history of anxiety, arthritis, prostate cancer, diabetes, hyperlipidemia, hypertension who presents to the ED for nausea vomiting with generalized weakness for the past 2 days.  Patient is also noted hyperglycemia for the past 48 hours.  He states he has been taking his insulin normally.  Patient states he has had fever and chills.  Past Medical History:  Diagnosis Date  . Anxiety   . Arthritis   . Cancer [facility])    Prostate  . Diabetes mellitus without complication ([place])   . Hyperlipidemia   . Hypertension   . Neuropathy   . Neuropathy   . Sleep apnea     Patient Active Problem List   Diagnosis Date Noted  . TIA (transient ischemic attack) 02/01/2017  . Cramps, muscle, general 07/04/2015    Past Surgical History:  Procedure Laterality Date  . APPENDECTOMY    . PROSTATECTOMY      Allergies Tramadol  Social History Social History   Tobacco Use  . Smoking status: Never Smoker  . Smokeless tobacco: Never Used  Substance Use Topics  . Alcohol use: No  . Drug use: No   Review of Systems Constitutional: Positive for fever and chills Cardiovascular: Negative for chest pain. Respiratory: Negative for shortness of breath. Gastrointestinal: Positive for nausea and vomiting Musculoskeletal: Negative for back pain. Skin: Negative for rash. Neurological: Positive for generalized weakness  All systems negative/normal/unremarkable except as stated in the HPI  ____________________________________________   PHYSICAL EXAM:  VITAL SIGNS: ED Triage Vitals  Enc Vitals Group     BP 11/25/18 1158 (!) 135/91      Pulse Rate 11/25/18 1158 (!) 107     Resp 11/25/18 1158 16     Temp 11/25/18 1158 99.6 F (37.6 C)     Temp Source 11/25/18 1158 Oral     SpO2 11/25/18 1158 95 %     Weight 11/25/18 1158 160 lb (72.6 kg)     Height 11/25/18 1158 5\' 9"  (1.753 m)     Head Circumference --      Peak Flow --      Pain Score 11/25/18 1156 9     Pain Loc --      Pain Edu? --      Excl. in [place]? --    Constitutional: Alert and oriented.  Mild distress Eyes: Conjunctivae are normal. Normal extraocular movements. ENT      Head: Normocephalic and atraumatic.      Nose: No congestion/rhinnorhea.      Mouth/Throat: Mucous membranes are moist.      Neck: No stridor. Cardiovascular: Rapid rate, regular rhythm. No murmurs, rubs, or gallops. Respiratory: Normal respiratory effort without tachypnea nor retractions. Breath sounds are clear and equal bilaterally. No wheezes/rales/rhonchi. Gastrointestinal: Soft and nontender. Normal bowel sounds Genitourinary: Bilateral inguinal adenopathy, left greater than right Musculoskeletal: Nontender with normal range of motion in extremities. No lower extremity tenderness nor edema. Neurologic:  Normal speech and language. No gross focal neurologic deficits are appreciated.  Skin:  Skin is warm, dry and intact.  Maculopapular rash across the abdomen Psychiatric: Mood and affect are normal. Speech and behavior are normal.  ____________________________________________  EKG: Interpreted by  me.  Sinus tachycardia with a rate of 104 bpm, normal PR interval, LVH, nonspecific ST segment changes, normal QT  ____________________________________________  ED COURSE:  As part of my medical decision making, I reviewed the following data within the [place] History obtained from family if available, nursing notes, old chart and ekg, as well as notes from prior ED visits. Patient presented for hyperglycemia with vomiting, we will assess with labs and imaging as  indicated at this time.   Procedures  Christopher [name] was evaluated in Emergency Department on 11/25/2018 for the symptoms described in the history of present illness. He was evaluated in the context of the global COVID-19 pandemic, which necessitated consideration that the patient might be at risk for infection with the SARS-CoV-2 virus that causes COVID-19. Institutional protocols and algorithms that pertain to the evaluation of patients at risk for COVID-19 are in a state of rapid change based on information released by regulatory bodies including the CDC and federal and state organizations. These policies and algorithms were followed during the patient's care in the ED.  ____________________________________________   LABS (pertinent positives/negatives)  Labs Reviewed  GLUCOSE, CAPILLARY - Abnormal; Notable for the following components:      Result Value   Glucose-Capillary 493 (*)    All other components within normal limits  CBC WITH DIFFERENTIAL/PLATELET - Abnormal; Notable for the following components:   WBC 14.7 (*)    Hemoglobin 12.0 (*)    HCT 37.5 (*)    Neutro Abs 12.1 (*)    Monocytes Absolute 1.5 (*)    Abs Immature Granulocytes 0.08 (*)    All other components within normal limits  BLOOD GAS, VENOUS - Abnormal; Notable for the following components:   pCO2, Ven 38 (*)    All other components within normal limits  BETA-HYDROXYBUTYRIC ACID - Abnormal; Notable for the following components:   Beta-Hydroxybutyric Acid 2.44 (*)    All other components within normal limits  COMPREHENSIVE METABOLIC PANEL  LIPASE, BLOOD  URINALYSIS, COMPLETE (UACMP) WITH MICROSCOPIC  TROPONIN I   CRITICAL CARE Performed by: Laurence Aly   Total critical care time: 30 minutes  Critical care time was exclusive of separately billable procedures and treating other patients.  Critical care was necessary to treat or prevent imminent or life-threatening deterioration.  Critical care was  time spent personally by me on the following activities: development of treatment plan with patient and/or surrogate as well as nursing, discussions with consultants, evaluation of patient's response to treatment, examination of patient, obtaining history from patient or surrogate, ordering and performing treatments and interventions, ordering and review of laboratory studies, ordering and review of radiographic studies, pulse oximetry and re-evaluation of patient's condition.  RADIOLOGY Images were viewed by me  Chest x-ray IMPRESSION: Left basilar heterogeneous opacities may represent atelectasis or scarring though developing airspace opacity could have a similar appearance. Further evaluation with a PA and lateral chest radiograph may be obtained as clinically indicated. ____________________________________________   DIFFERENTIAL DIAGNOSIS   DKA, dehydration, electrolyte abnormality, occult infection  FINAL ASSESSMENT AND PLAN  DKA, CAP   Plan: The patient had presented for hyperglycemia and vomiting. Patient's labs did reveal leukocytosis and elevated beta hydroxybutyric acid.  Patient's troponin is also elevated.  Patient's imaging was concerning for left basilar opacity and possible pneumonia.  We have started him on IV fluids as well as IV Levaquin.  We will start him on insulin drip for DKA as well.  I will discuss with  the hospitalist for admission.   Laurence Aly, MD    Note: This note was generated in part or whole with voice recognition software. Voice recognition is usually quite accurate but there are transcription errors that can and very often do occur. I apologize for any typographical errors that were not detected and corrected.     Earleen Newport, MD 11/25/18 [place]    Earleen Newport, MD 11/25/18 1320    Earleen Newport, MD 11/25/18 [phone]

## 2018-11-25 NOTE — ED Notes (Signed)
Will give Lantus once up from pharmacy. Will d/c insulin gtt 1hr post Lantus.

## 2018-11-26 ENCOUNTER — Other Ambulatory Visit: Payer: Self-pay

## 2018-11-26 LAB — RESPIRATORY PANEL BY PCR

## 2018-11-26 LAB — BASIC METABOLIC PANEL
Anion gap: 11 (ref 5–15)
Anion gap: 11 (ref 5–15)
Anion gap: 10 (ref 5–15)
BUN: 10 mg/dL (ref 6–20)
BUN: 10 mg/dL (ref 6–20)
BUN: 10 mg/dL (ref 6–20)
CO2: 24 mmol/L (ref 22–32)
CO2: 24 mmol/L (ref 22–32)
CO2: 24 mmol/L (ref 22–32)
Calcium: 8.8 mg/dL — ABNORMAL LOW (ref 8.9–10.3)
Calcium: 9 mg/dL (ref 8.9–10.3)
Calcium: 9.1 mg/dL (ref 8.9–10.3)
Chloride: 94 mmol/L — ABNORMAL LOW (ref 98–111)
Chloride: 96 mmol/L — ABNORMAL LOW (ref 98–111)
Chloride: 97 mmol/L — ABNORMAL LOW (ref 98–111)
Creatinine, Ser: 0.93 mg/dL (ref 0.61–1.24)
Creatinine, Ser: 0.72 mg/dL (ref 0.61–1.24)
Creatinine, Ser: 0.77 mg/dL (ref 0.61–1.24)
GFR calc Af Amer: >60 mL/min (ref >60)
GFR calc Af Amer: >60 mL/min (ref >60)
GFR calc Af Amer: >60 mL/min (ref >60)
GFR calc non Af Amer: >60 mL/min (ref >60)
GFR calc non Af Amer: >60 mL/min (ref >60)
GFR calc non Af Amer: >60 mL/min (ref >60)
Glucose, Bld: 405 mg/dL — ABNORMAL HIGH (ref 70–99)
Glucose, Bld: 210 mg/dL — ABNORMAL HIGH (ref 70–99)
Glucose, Bld: 296 mg/dL — ABNORMAL HIGH (ref 70–99)
Potassium: 4.2 mmol/L (ref 3.5–5.1)
Potassium: 3.5 mmol/L (ref 3.5–5.1)
Potassium: 4.2 mmol/L (ref 3.5–5.1)
Sodium: 129 mmol/L — ABNORMAL LOW (ref 135–145)
Sodium: 131 mmol/L — ABNORMAL LOW (ref 135–145)
Sodium: 131 mmol/L — ABNORMAL LOW (ref 135–145)

## 2018-11-26 LAB — GLUCOSE, CAPILLARY
Glucose-Capillary: 282 mg/dL — ABNORMAL HIGH (ref 70–99)
Glucose-Capillary: 246 mg/dL — ABNORMAL HIGH (ref 70–99)
Glucose-Capillary: 290 mg/dL — ABNORMAL HIGH (ref 70–99)
Glucose-Capillary: 236 mg/dL — ABNORMAL HIGH (ref 70–99)
Glucose-Capillary: 349 mg/dL — ABNORMAL HIGH (ref 70–99)
Glucose-Capillary: 275 mg/dL — ABNORMAL HIGH (ref 70–99)

## 2018-11-26 LAB — CBC
HCT: 33.4 % — ABNORMAL LOW (ref 39.0–52.0)
HCT: 32.7 % — ABNORMAL LOW (ref 39.0–52.0)
Hemoglobin: 10.8 g/dL — ABNORMAL LOW (ref 13.0–17.0)
Hemoglobin: 10.6 g/dL — ABNORMAL LOW (ref 13.0–17.0)
MCH: 27 pg (ref 26.0–34.0)
MCH: 26.8 pg (ref 26.0–34.0)
MCHC: 32.3 g/dL (ref 30.0–36.0)
MCHC: 32.4 g/dL (ref 30.0–36.0)
MCV: 83.5 fL (ref 80.0–100.0)
MCV: 82.8 fL (ref 80.0–100.0)
Platelets: 293 10*3/uL (ref 150–400)
Platelets: 294 10*3/uL (ref 150–400)
RBC: 4 MIL/uL — ABNORMAL LOW (ref 4.22–5.81)
RBC: 3.95 MIL/uL — ABNORMAL LOW (ref 4.22–5.81)
RDW: 12 % (ref 11.5–15.5)
RDW: 11.9 % (ref 11.5–15.5)
WBC: 15 10*3/uL — ABNORMAL HIGH (ref 4.0–10.5)
WBC: 13 10*3/uL — ABNORMAL HIGH (ref 4.0–10.5)
nRBC: 0 % (ref 0.0–0.2)
nRBC: 0 % (ref 0.0–0.2)

## 2018-11-26 LAB — TROPONIN I: Troponin I: <0.03 ng/mL (ref <0.03)

## 2018-11-26 MED ORDER — INSULIN GLARGINE 100 UNIT/ML ~~LOC~~ SOLN
30.0000 [IU] | Freq: Two times a day (BID) | SUBCUTANEOUS | Status: DC
  Administered 2018-11-26 – 2018-11-27 (×4): 30 [IU] via SUBCUTANEOUS
  Filled 2018-11-26 (×6): qty 0.3

## 2018-11-26 MED ORDER — ACETAMINOPHEN 325 MG PO TABS
650.0000 mg | ORAL_TABLET | Freq: Four times a day (QID) | ORAL | Status: DC | PRN
  Administered 2018-11-26: 650 mg via ORAL
  Filled 2018-11-26: qty 2

## 2018-11-26 MED ORDER — INSULIN ASPART 100 UNIT/ML ~~LOC~~ SOLN
5.0000 [IU] | Freq: Three times a day (TID) | SUBCUTANEOUS | Status: DC
  Administered 2018-11-26 – 2018-11-27 (×4): 5 [IU] via SUBCUTANEOUS
  Filled 2018-11-26 (×4): qty 1

## 2018-11-26 MED ORDER — SODIUM CHLORIDE 0.9 % IV SOLN
INTRAVENOUS | Status: DC | PRN
  Administered 2018-11-26: 250 mL via INTRAVENOUS

## 2018-11-26 MED ORDER — INSULIN REGULAR HUMAN 100 UNIT/ML IJ SOLN
5.0000 [IU] | Freq: Once | INTRAMUSCULAR | Status: AC
  Administered 2018-11-26: 04:00:00 5 [IU] via INTRAVENOUS
  Filled 2018-11-26: qty 10

## 2018-11-26 MED ORDER — POTASSIUM CHLORIDE CRYS ER 20 MEQ PO TBCR
40.0000 meq | EXTENDED_RELEASE_TABLET | Freq: Once | ORAL | Status: AC
  Administered 2018-11-26: 16:00:00 40 meq via ORAL
  Filled 2018-11-26: qty 2

## 2018-11-26 MED ORDER — INSULIN ASPART 100 UNIT/ML ~~LOC~~ SOLN
0.0000 [IU] | Freq: Three times a day (TID) | SUBCUTANEOUS | Status: DC
  Administered 2018-11-26: 01:00:00 9 [IU] via SUBCUTANEOUS
  Administered 2018-11-26: 11 [IU] via SUBCUTANEOUS
  Administered 2018-11-26: 22:00:00 8 [IU] via SUBCUTANEOUS
  Administered 2018-11-26 (×2): 5 [IU] via SUBCUTANEOUS
  Administered 2018-11-27: 3 [IU] via SUBCUTANEOUS
  Administered 2018-11-27: 2 [IU] via SUBCUTANEOUS
  Administered 2018-11-27: 5 [IU] via SUBCUTANEOUS
  Administered 2018-11-27 – 2018-11-28 (×3): 3 [IU] via SUBCUTANEOUS
  Filled 2018-11-26 (×11): qty 1

## 2018-11-26 MED ORDER — ENOXAPARIN SODIUM 40 MG/0.4ML ~~LOC~~ SOLN
40.0000 mg | SUBCUTANEOUS | Status: DC
  Administered 2018-11-27: 40 mg via SUBCUTANEOUS
  Filled 2018-11-26: qty 0.4

## 2018-11-26 NOTE — Plan of Care (Signed)
Patient admitted about 1230am. Patient profile completed. Patient is complaining of chronic shoulder and back pain. Blood glucose level 290, treated per protocol. Will continue to monitor and assess.

## 2018-11-26 NOTE — Progress Notes (Signed)
PHARMACIST - PHYSICIAN COMMUNICATION  CONCERNING:  Enoxaparin (Lovenox) for DVT Prophylaxis    RECOMMENDATION: Patient was prescribed enoxaprin 30 mg q24 hours for VTE prophylaxis.   Filed Weights   11/25/18 1158  Weight: 160 lb (72.6 kg)    Body mass index is 23.63 kg/m.  Estimated Creatinine Clearance: 105.6 mL/min (by C-G formula based on SCr of 0.77 mg/dL).    Patient is candidate for enoxaparin 40mg  every 24 hours based on CrCl >71ml/min and Weight greater 50kg for male.  DESCRIPTION: Pharmacy has adjusted enoxaparin dose per [facility] policy.  Patient is now receiving enoxaparin 40mg  every 24 hours.  Rowland Lathe, PharmD Clinical Pharmacist  11/26/2018 11:41 AM

## 2018-11-26 NOTE — Plan of Care (Signed)
  Problem: Education: Goal: Knowledge of General Education information will improve Description Including pain rating scale, medication(s)/side effects and non-pharmacologic comfort measures Outcome: Progressing   Problem: Health Behavior/Discharge Planning: Goal: Ability to manage health-related needs will improve Outcome: Progressing   Problem: Clinical Measurements: Goal: Ability to maintain clinical measurements within normal limits will improve Outcome: Progressing Goal: Will remain free from infection Outcome: Progressing Goal: Diagnostic test results will improve Outcome: Progressing Goal: Respiratory complications will improve Outcome: Progressing Goal: Cardiovascular complication will be avoided Outcome: Progressing   Problem: Activity: Goal: Risk for activity intolerance will decrease Outcome: Progressing   Problem: Nutrition: Goal: Adequate nutrition will be maintained Outcome: Progressing   Problem: Coping: Goal: Level of anxiety will decrease Outcome: Progressing   Problem: Elimination: Goal: Will not experience complications related to bowel motility Outcome: Progressing Goal: Will not experience complications related to urinary retention Outcome: Progressing   Problem: Pain Managment: Goal: General experience of comfort will improve Outcome: Progressing   Problem: Safety: Goal: Ability to remain free from injury will improve Outcome: Progressing   Problem: Skin Integrity: Goal: Risk for impaired skin integrity will decrease Outcome: Progressing   Problem: Education: Goal: Ability to describe self-care measures that may prevent or decrease complications (Diabetes Survival Skills Education) will improve Outcome: Progressing Goal: Individualized Educational Video(s) Outcome: Progressing   Problem: Cardiac: Goal: Ability to maintain an adequate cardiac output will improve Outcome: Progressing   Problem: Health Behavior/Discharge  Planning: Goal: Ability to identify and utilize available resources and services will improve Outcome: Progressing Goal: Ability to manage health-related needs will improve Outcome: Progressing   Problem: Metabolic: Goal: Ability to maintain appropriate glucose levels will improve Outcome: Progressing   Problem: Activity: Goal: Ability to tolerate increased activity will improve Outcome: Progressing   Problem: Clinical Measurements: Goal: Ability to maintain a body temperature in the normal range will improve Outcome: Progressing   Problem: Respiratory: Goal: Ability to maintain adequate ventilation will improve Outcome: Progressing Note:  Education provided regarding DB&C exercise to expectorate fluid from lungs. Goal: Ability to maintain a clear airway will improve Outcome: Progressing

## 2018-11-26 NOTE — TOC Initial Note (Signed)
Transition of Care [facility]) - Initial/Assessment Note    Patient Details  Name: Christopher [name] MRN: [id number] Date of Birth: 06/12/1966  Transition [facility]) CM/SW Contact:    Elza Rafter, RN Phone Number: 11/26/2018, 12:21 PM  Clinical Narrative:   Patient is independent from home with ex-wife.  He was admitted with DKA; has also been febrile.  States he obtains his insulin at [employer].  Does not currently have a PCP.  He is in the process of making a appointment with Dr. Ouida Sills with [facility].   RNCM consult placed for medication needs.  Patient has Medicaid and pays $4.00 per medication.  Explained to patient he can apply for additional assistance with Medicaid for medication expenses.  He occasionally uses a cane.  No transportation issues.                Expected Discharge Plan: Home/Self Care Barriers to Discharge: Continued Medical Work up   Patient Goals and CMS Choice        Expected Discharge Plan and Services Expected Discharge Plan: Home/Self Care   Discharge Planning Services: CM Consult, Follow-up appt scheduled                         HH Arranged: Patient Refused HH    Prior Living Arrangements/Services   Lives with:: (ex wife) Patient language and need for interpreter reviewed:: Yes Do you feel safe going back to the place where you live?: Yes            Criminal Activity/Legal Involvement Pertinent to Current Situation/Hospitalization: No - Comment as needed  Activities of Daily Living Home Assistive Devices/Equipment: CBG Meter, Eyeglasses, CPAP ADL Screening (condition at time of admission) Patient's cognitive ability adequate to safely complete daily activities?: Yes Is the patient deaf or have difficulty hearing?: No Does the patient have difficulty seeing, even when wearing glasses/contacts?: No Does the patient have difficulty concentrating, remembering, or making decisions?: No Patient able to express need for assistance with ADLs?:  Yes Does the patient have difficulty dressing or bathing?: No Independently performs ADLs?: Yes (appropriate for developmental age) Does the patient have difficulty walking or climbing stairs?: Yes Weakness of Legs: Both Weakness of Arms/Hands: None  Permission Sought/Granted                  Emotional Assessment   Attitude/Demeanor/Rapport: Self-Confident, Gracious Affect (typically observed): Accepting Orientation: : Oriented to Self, Oriented to Place, Oriented to  Time, Oriented to Situation Alcohol / Substance Use: Not Applicable    Admission diagnosis:  Inguinal adenopathy [R59.0] Diabetic ketoacidosis without coma associated with diabetes mellitus due to underlying condition ([place]) [E08.10] Community acquired pneumonia of left lower lobe of lung ([place]) [J18.1] PNA (pneumonia) [J18.9] Patient Active Problem List   Diagnosis Date Noted  . DKA (diabetic ketoacidoses) ([place]) 11/25/2018  . PNA (pneumonia) 11/25/2018  . TIA (transient ischemic attack) 02/01/2017  . Cramps, muscle, general 07/04/2015   PCP:  Patient, No Pcp Per Pharmacy:   [facility] [address], [place] - [place] [address] Phone: [phone] Fax: [place] [address]), [place] - [place]) [place] [address] Phone: [phone] Fax: [phone]     Social Determinants of Health (SDOH) Interventions    Readmission Risk Interventions No flowsheet data found.

## 2018-11-26 NOTE — Progress Notes (Signed)
Patient was given percocet for pain.

## 2018-11-26 NOTE — Progress Notes (Signed)
[place] spoke w/ pt via telephone to see how well pt was progressing. Pt shared that he begin having issues w/ swallowing food, nauseous, unable to keep his balance, and trouble breathing/fever/chills which is what lead to his hospitalization. Pt shared that he is a diabetic and is currently on disability. [place] asked if pt was in pain and he shared that he had pain in his L shoulder/rib cage area. Pt shared that he is aware he has pneumonia but  neg for CV-19. Pt hwas concerned about his heart as he shared that he is supposed to hv a test done regarding his heart condition. Ch allowed pt to lament about his condition and provided words of encouragement during a time where he has to be separated from family and to try to hv family on speaker phone when he is being updated by his care providers about his health status. Pt appreciated the call.        11/26/18 1300  Clinical Encounter Type  Visited With Patient  Visit Type Psychological support;Spiritual support;Social support  Spiritual Encounters  Spiritual Needs Emotional;Grief support  Stress Factors  Patient Stress Factors Health changes;Major life changes  Family Stress Factors None identified

## 2018-11-26 NOTE — Progress Notes (Signed)
[place] at [place] NAME: Christopher [name]    MR#:  [id number]  DATE OF BIRTH:  1965/10/31  SUBJECTIVE:  CHIEF COMPLAINT:   Chief Complaint  Patient presents with  . Emesis  . Hyperglycemia   No new complaint this morning.  No fevers.  Intermittent cough. DKA already corrected.  Patient off insulin drip.  Currently on subacute insulin.  REVIEW OF SYSTEMS:  Review of Systems  Constitutional: Negative for chills and fever.  HENT: Negative for hearing loss and tinnitus.   Eyes: Negative for blurred vision and double vision.  Respiratory: Positive for cough. Negative for shortness of breath.   Cardiovascular: Negative for chest pain and palpitations.  Gastrointestinal: Negative for heartburn, nausea and vomiting.  Genitourinary: Negative for dysuria and urgency.  Musculoskeletal: Negative for myalgias and neck pain.  Skin: Negative for itching and rash.  Neurological: Negative for dizziness and headaches.  Psychiatric/Behavioral: Negative for depression and hallucinations.    DRUG ALLERGIES:   Allergies  Allergen Reactions  . Tramadol Nausea And Vomiting   VITALS:  Blood pressure (!) 143/92, pulse 86, temperature 99.1 F (37.3 C), temperature source Oral, resp. rate 16, height 5\' 9"  (1.753 m), weight 72.6 kg, SpO2 100 %. PHYSICAL EXAMINATION:   Physical Exam  Constitutional: He is oriented to person, place, and time. He appears well-developed and well-nourished.  HENT:  Head: Normocephalic.  Right Ear: External ear normal.  Eyes: Pupils are equal, round, and reactive to light. Conjunctivae are normal. Right eye exhibits no discharge.  Neck: Normal range of motion. Neck supple. No tracheal deviation present.  Cardiovascular: Normal rate, regular rhythm and normal heart sounds.  Respiratory: Effort normal. No respiratory distress. He has no wheezes. He has rales.  GI: Soft. Bowel sounds are normal. He exhibits no distension.   Musculoskeletal: Normal range of motion.        General: No edema.  Neurological: He is alert and oriented to person, place, and time. A cranial nerve deficit is present.  Skin: Skin is warm. He is not diaphoretic. No erythema.  Psychiatric: He has a normal mood and affect. His behavior is normal.   LABORATORY PANEL:  Male CBC Recent Labs  Lab 11/26/18 0444  WBC 13.0*  HGB 10.6*  HCT 32.7*  PLT 294   ------------------------------------------------------------------------------------------------------------------ Chemistries  Recent Labs  Lab 11/25/18 1712  11/26/18 0955  NA 129*   < > 131*  K 3.6   < > 4.2  CL 95*   < > 97*  CO2 24   < > 24  GLUCOSE 287*   < > 296*  BUN 8   < > 10  CREATININE 0.89   < > 0.77  CALCIUM 9.2   < > 9.1  AST 14*  --   --   ALT 10  --   --   ALKPHOS 96  --   --   BILITOT 0.6  --   --    < > = values in this interval not displayed.   RADIOLOGY:  No results found. ASSESSMENT AND PLAN:   1. DKA Resolved.  Patient already weaned off insulin drip to subacute insulin. Replace electrolytes. Follow-up on glycosylated hemoglobin level in a.m.  2.Pseudohyponatremia from hyperglycemia Improved with correction of hyperglycemia.  BMP in a.m.  3.Pneumonia-seems to be community-acquired Rocephin and azithromycin sputum culture and sensitivity Influenza test negative. Respiratory panel Covid test already ordered.  Patient currently on droplet isolation.  Follow-up on  results when available  4.  Mildly elevated troponin probably demand ischemia Telemetry monitoring.  Troponin level trended down to less than 0.03.  Patient completely asymptomatic. Patient already seen by cardiologist.  Recommendation was for 2D echocardiogram which has been ordered.  5. Diabetic neuropathy .continue Lyrica  DVT prophylaxis; Lovenox  All the records are reviewed and case discussed with Care Management/Social Worker. Management plans discussed with the patient,  family and they are in agreement.  CODE STATUS: Full Code  TOTAL TIME TAKING CARE OF THIS PATIENT: 35 minutes.   More than 50% of the time was spent in counseling/coordination of care: YES  POSSIBLE D/C IN 2 DAYS, DEPENDING ON CLINICAL CONDITION.   Akim Watkinson M.D on 11/26/2018 at 1:52 PM  Between 7am to 6pm - Pager - [phone]  After 6pm go to [email or web address] - Proofreader  [facility]  Office  [phone]  CC: Primary care physician; Patient, No Pcp Per  Note: This dictation was prepared with Dragon dictation along with smaller phrase technology. Any transcriptional errors that result from this process are unintentional.

## 2018-11-27 LAB — BASIC METABOLIC PANEL
Anion gap: 8 (ref 5–15)
BUN: 14 mg/dL (ref 6–20)
CO2: 26 mmol/L (ref 22–32)
Calcium: 8.9 mg/dL (ref 8.9–10.3)
Chloride: 100 mmol/L (ref 98–111)
Creatinine, Ser: 0.68 mg/dL (ref 0.61–1.24)
GFR calc Af Amer: >60 mL/min (ref >60)
GFR calc non Af Amer: >60 mL/min (ref >60)
Glucose, Bld: 187 mg/dL — ABNORMAL HIGH (ref 70–99)
Potassium: 3.6 mmol/L (ref 3.5–5.1)
Sodium: 134 mmol/L — ABNORMAL LOW (ref 135–145)

## 2018-11-27 LAB — CBC
HCT: 32.6 % — ABNORMAL LOW (ref 39.0–52.0)
Hemoglobin: 10.4 g/dL — ABNORMAL LOW (ref 13.0–17.0)
MCH: 27 pg (ref 26.0–34.0)
MCHC: 31.9 g/dL (ref 30.0–36.0)
MCV: 84.7 fL (ref 80.0–100.0)
Platelets: 321 10*3/uL (ref 150–400)
RBC: 3.85 MIL/uL — ABNORMAL LOW (ref 4.22–5.81)
RDW: 12.2 % (ref 11.5–15.5)
WBC: 11.1 10*3/uL — ABNORMAL HIGH (ref 4.0–10.5)
nRBC: 0.2 % (ref 0.0–0.2)

## 2018-11-27 LAB — GLUCOSE, CAPILLARY
Glucose-Capillary: 175 mg/dL — ABNORMAL HIGH (ref 70–99)
Glucose-Capillary: 127 mg/dL — ABNORMAL HIGH (ref 70–99)
Glucose-Capillary: 230 mg/dL — ABNORMAL HIGH (ref 70–99)
Glucose-Capillary: 198 mg/dL — ABNORMAL HIGH (ref 70–99)
Glucose-Capillary: 166 mg/dL — ABNORMAL HIGH (ref 70–99)

## 2018-11-27 LAB — MAGNESIUM: Magnesium: 2.3 mg/dL (ref 1.7–2.4)

## 2018-11-27 LAB — HEMOGLOBIN A1C
Hgb A1c MFr Bld: 16.9 % — ABNORMAL HIGH (ref 4.8–5.6)
Mean Plasma Glucose: 438.33 mg/dL

## 2018-11-27 LAB — HIV ANTIBODY (ROUTINE TESTING W REFLEX): HIV Screen 4th Generation wRfx: NONREACTIVE

## 2018-11-27 MED ORDER — CEFDINIR 300 MG PO CAPS
300.0000 mg | ORAL_CAPSULE | Freq: Two times a day (BID) | ORAL | Status: DC
  Administered 2018-11-28: 300 mg via ORAL
  Filled 2018-11-27 (×2): qty 1

## 2018-11-27 MED ORDER — INSULIN ASPART 100 UNIT/ML ~~LOC~~ SOLN
8.0000 [IU] | Freq: Three times a day (TID) | SUBCUTANEOUS | Status: DC
  Administered 2018-11-27 – 2018-11-28 (×3): 8 [IU] via SUBCUTANEOUS
  Filled 2018-11-27 (×3): qty 1

## 2018-11-27 NOTE — Progress Notes (Signed)
Ch spoke w/ pt via telephone to f/u. Pt had a positive affect and shared he was finally able to hold down foods as he had challenges w/ nausea before. Ch informed pt that she contacted the pt sister Altha Harm) to share an update w/ her and to confirm that she was a reliable contact. Pt understood and shared that he hoped to be d/c in the next day or two. Ch did active listening w/ pt as he shared about his challenges w/ being a diabetic. Pt was thankful of the call.    11/27/18 0900  Clinical Encounter Type  Visited With Patient  Visit Type Psychological support;Spiritual support;Social support  Spiritual Encounters  Spiritual Needs Emotional;Grief support  Stress Factors  Patient Stress Factors Health changes;Major life changes  Family Stress Factors None identified

## 2018-11-27 NOTE — Progress Notes (Addendum)
Inpatient Diabetes Program Recommendations  AACE/ADA: New Consensus Statement on Inpatient Glycemic Control   Target Ranges:  Prepandial:   less than 140 mg/dL      Peak postprandial:   less than 180 mg/dL (1-2 hours)      Critically ill patients:  140 - 180 mg/dL   Results for Christopher [name], Christopher [name] (MRN [id number]) as of 11/27/2018 14:24  Ref. Range 11/26/2018 08:57 11/26/2018 11:28 11/26/2018 17:13 11/26/2018 21:47 11/27/2018 04:00 11/27/2018 08:27 11/27/2018 11:36  Glucose-Capillary Latest Ref Range: 70 - 99 mg/dL 246 (H) 236 (H) 349 (H) 275 (H) 175 (H) 127 (H) 230 (H)  Results for Christopher [name], Christopher [name] (MRN [id number]) as of 11/27/2018 14:24  Ref. Range 02/01/2017 00:16 11/27/2018 04:43  Hemoglobin A1C Latest Ref Range: 4.8 - 5.6 % >15.5 (H) 16.9 (H)   Review of Glycemic Control  Diabetes history: DM2 Outpatient Diabetes medications: Lantus 40 units QHS, Novolog 20 units with meals plus additional units for correction (patient reports being out of both insulin for several months) Current orders for Inpatient glycemic control: Lantus 30 units BID, Novolog 5 units TID with meals for meal coverage, Novolog 0-15 units AC&HS  Inpatient Diabetes Program Recommendations:   Insulin-Meal Coverage: Please consider increasing meal coverage to Novolog 8 units TID with meals.  HgbA1C: A1C 16.9% on 11/27/18 indicating an average glucose of 438 mg/dl.  Patient notes that he has been out of insulin for months and that he does not have a PCP.  NOTE: Spoke with patient over the phone about diabetes and home regimen for diabetes control. Patient reports that he had active Medicaid but he does not have a PCP.  Patient states that he has been out of insulin (Lantus and Novolog) for several months but he notes that he has testing supplies at home.   Patient was not able to verbalize how much Lantus and Novolog he was taking but was able to read off his papers and tell me that he was suppose to be taking Lantus 40 units QHS, Novolog 20 units  TID with meals, plus additional units of Novolog for correction when needed.   Patient states that when he was taking the insulin and checking his glucose, his glucose was always over 300 mg/dl.  Discussed A1C results (16.9% on 11/27/18) and explained that his current A1C indicates an average glucose of 438 mg/dl over the past 2-3 months. Discussed glucose and A1C goals. Discussed importance of checking CBGs and maintaining good CBG control to prevent long-term and short-term complications. Explained how hyperglycemia leads to damage within blood vessels which lead to the common complications seen with uncontrolled diabetes. Stressed to the patient the importance of improving glycemic control to prevent further complications from uncontrolled diabetes. Discussed complications in detail related to uncontrolled DM. Discussed impact of nutrition, exercise, stress, sickness, and medications on diabetes control.Informed patient that case manager was already consulted for medication needs but consult would be modified so they can assist him with arranging follow up. If Medicaid is active, medications should be $3 each.  Patient states that he was using insulin pens and prefers to use insulin pens.  Will need Rx for insulin pens and insulin pen needles at time of discharge. Reiterated importance of getting DM controlled to prevent further complications from uncontrolled DM.  Patient verbalized understanding of information discussed and he states that he has no further questions at this time related to diabetes.  At time of discharge: please provide Rx for: Lantus SoloStar pens(#[phone]), Novolog FlexPens [phone]), and insulin  pen needles [phone]).   Thanks, Barnie Alderman, RN, MSN, CDE Diabetes Coordinator Inpatient Diabetes Program [phone] (Team Pager)

## 2018-11-27 NOTE — Plan of Care (Signed)
  Problem: Education: Goal: Knowledge of General Education information will improve Description Including pain rating scale, medication(s)/side effects and non-pharmacologic comfort measures Outcome: Progressing   Problem: Health Behavior/Discharge Planning: Goal: Ability to manage health-related needs will improve Outcome: Progressing   Problem: Clinical Measurements: Goal: Ability to maintain clinical measurements within normal limits will improve Outcome: Progressing Goal: Will remain free from infection Outcome: Progressing Goal: Diagnostic test results will improve Outcome: Progressing Goal: Respiratory complications will improve Outcome: Progressing Goal: Cardiovascular complication will be avoided Outcome: Progressing   Problem: Activity: Goal: Risk for activity intolerance will decrease Outcome: Progressing   Problem: Nutrition: Goal: Adequate nutrition will be maintained Outcome: Progressing   Problem: Coping: Goal: Level of anxiety will decrease Outcome: Progressing   Problem: Elimination: Goal: Will not experience complications related to bowel motility Outcome: Progressing Goal: Will not experience complications related to urinary retention Outcome: Progressing   Problem: Pain Managment: Goal: General experience of comfort will improve Outcome: Progressing   Problem: Safety: Goal: Ability to remain free from injury will improve Outcome: Progressing   Problem: Skin Integrity: Goal: Risk for impaired skin integrity will decrease Outcome: Progressing   Problem: Education: Goal: Ability to describe self-care measures that may prevent or decrease complications (Diabetes Survival Skills Education) will improve Outcome: Progressing Goal: Individualized Educational Video(s) Outcome: Progressing   Problem: Cardiac: Goal: Ability to maintain an adequate cardiac output will improve Outcome: Progressing   Problem: Health Behavior/Discharge  Planning: Goal: Ability to identify and utilize available resources and services will improve Outcome: Progressing Goal: Ability to manage health-related needs will improve Outcome: Progressing   Problem: Metabolic: Goal: Ability to maintain appropriate glucose levels will improve Outcome: Progressing   Problem: Activity: Goal: Ability to tolerate increased activity will improve Outcome: Progressing   Problem: Clinical Measurements: Goal: Ability to maintain a body temperature in the normal range will improve Outcome: Progressing   Problem: Respiratory: Goal: Ability to maintain adequate ventilation will improve Outcome: Progressing Goal: Ability to maintain a clear airway will improve Outcome: Progressing

## 2018-11-27 NOTE — Progress Notes (Signed)
SUBJECTIVE: Patient is less short of breath and has no chest pain but has some back pain  Vitals:   11/26/18 2153 11/27/18 0038 11/27/18 0405 11/27/18 0830  BP: 99/71  114/71 111/75  Pulse: 75 73 67 79  Resp: 16 16 16 18   Temp: 97.7 F (36.5 C)  99 F (37.2 C) 98.3 F (36.8 C)  TempSrc:    Oral  SpO2: 100% 97% 100% 100%  Weight:      Height:        Intake/Output Summary (Last 24 hours) at 11/27/2018 1040 Last data filed at 11/27/2018 0425 Gross per 24 hour  Intake 600 ml  Output 1250 ml  Net -650 ml    LABS: Basic Metabolic Panel: Recent Labs    11/26/18 0955 11/27/18 0443  NA 131* 134*  K 4.2 3.6  CL 97* 100  CO2 24 26  GLUCOSE 296* 187*  BUN 10 14  CREATININE 0.77 0.68  CALCIUM 9.1 8.9  MG  --  2.3   Liver Function Tests: Recent Labs    11/25/18 1203 11/25/18 1712  AST 12* 14*  ALT 13 10  ALKPHOS 123 96  BILITOT 1.1 0.6  PROT 8.3* 8.2*  ALBUMIN 3.3* 2.9*   Recent Labs    11/25/18 1203  LIPASE 25   CBC: Recent Labs    11/25/18 1203  11/26/18 0444 11/27/18 0443  WBC 14.7*   < > 13.0* 11.1*  NEUTROABS 12.1*  --   --   --   HGB 12.0*   < > 10.6* 10.4*  HCT 37.5*   < > 32.7* 32.6*  MCV 84.3   < > 82.8 84.7  PLT 333   < > 294 321   < > = values in this interval not displayed.   Cardiac Enzymes: Recent Labs    11/25/18 1203 11/25/18 1712 11/26/18 0106  TROPONINI 0.08* 0.05* <0.03   BNP: Invalid input(s): POCBNP D-Dimer: No results for input(s): DDIMER in the last 72 hours. Hemoglobin A1C: Recent Labs    11/27/18 0443  HGBA1C 16.9*   Fasting Lipid Panel: No results for input(s): CHOL, HDL, LDLCALC, TRIG, CHOLHDL, LDLDIRECT in the last 72 hours. Thyroid Function Tests: No results for input(s): TSH, T4TOTAL, T3FREE, THYROIDAB in the last 72 hours.  Invalid input(s): FREET3 Anemia Panel: No results for input(s): VITAMINB12, FOLATE, FERRITIN, TIBC, IRON, RETICCTPCT in the last 72 hours.   PHYSICAL EXAM General: Well developed,  well nourished, in no acute distress HEENT:  Normocephalic and atramatic Neck:  No JVD.  Lungs: Clear bilaterally to auscultation and percussion. Heart: HRRR . Normal S1 and S2 without gallops or murmurs.  Abdomen: Bowel sounds are positive, abdomen soft and non-tender  Msk:  Back normal, normal gait. Normal strength and tone for age. Extremities: No clubbing, cyanosis or edema.   Neuro: Alert and oriented X 3. Psych:  Good affect, responds appropriately  TELEMETRY: Sinus rhythm  ASSESSMENT AND PLAN: DKA/bilateral pneumonia with mildly elevated troponin and atypical chest pain.  Echocardiogram will be done once patient is negative for COVID-19.  Echo technician called me and I said it is okay to wait till COVID-19 results come back.  Patient is clinically doing well.  Elevated troponin most likely due to demand ischemia.  Active Problems:   DKA (diabetic ketoacidoses) (HCC)   PNA (pneumonia)    Neoma Laming A, MD, [facility] 11/27/2018 10:40 AM

## 2018-11-27 NOTE — Progress Notes (Signed)
[place] [facility] [place] NAME: Semisi Biela    MR#:  [id number]  DATE OF BIRTH:  September 18, 1965  SUBJECTIVE:  CHIEF COMPLAINT:   Chief Complaint  Patient presents with  . Emesis  . Hyperglycemia   No new complaint this morning.  No fevers.  Intermittent cough. DKA already corrected.  Patient off insulin drip.  Currently on subacute insulin.  REVIEW OF SYSTEMS:  Review of Systems  Constitutional: Negative for chills and fever.  HENT: Negative for hearing loss and tinnitus.   Eyes: Negative for blurred vision and double vision.  Respiratory: Positive for cough. Negative for shortness of breath.   Cardiovascular: Negative for chest pain and palpitations.  Gastrointestinal: Negative for heartburn, nausea and vomiting.  Genitourinary: Negative for dysuria and urgency.  Musculoskeletal: Negative for myalgias and neck pain.  Skin: Negative for itching and rash.  Neurological: Negative for dizziness and headaches.  Psychiatric/Behavioral: Negative for depression and hallucinations.    DRUG ALLERGIES:   Allergies  Allergen Reactions  . Tramadol Nausea And Vomiting   VITALS:  Blood pressure 111/75, pulse 79, temperature 98.3 F (36.8 C), temperature source Oral, resp. rate 18, height 5\' 9"  (1.753 m), weight 72.6 kg, SpO2 100 %. PHYSICAL EXAMINATION:   Physical Exam  Constitutional: He is oriented to person, place, and time. He appears well-developed and well-nourished.  HENT:  Head: Normocephalic.  Right Ear: External ear normal.  Eyes: Pupils are equal, round, and reactive to light. Conjunctivae are normal. Right eye exhibits no discharge.  Neck: Normal range of motion. Neck supple. No tracheal deviation present.  Cardiovascular: Normal rate, regular rhythm and normal heart sounds.  Respiratory: Effort normal. No respiratory distress. He has no wheezes. He has rales.  GI: Soft. Bowel sounds are normal. He exhibits no distension.   Musculoskeletal: Normal range of motion.        General: No edema.  Neurological: He is alert and oriented to person, place, and time. A cranial nerve deficit is present.  Skin: Skin is warm. He is not diaphoretic. No erythema.  Psychiatric: He has a normal mood and affect. His behavior is normal.   LABORATORY PANEL:  Male CBC Recent Labs  Lab 11/27/18 0443  WBC 11.1*  HGB 10.4*  HCT 32.6*  PLT 321   ------------------------------------------------------------------------------------------------------------------ Chemistries  Recent Labs  Lab 11/25/18 1712  11/27/18 0443  NA 129*   < > 134*  K 3.6   < > 3.6  CL 95*   < > 100  CO2 24   < > 26  GLUCOSE 287*   < > 187*  BUN 8   < > 14  CREATININE 0.89   < > 0.68  CALCIUM 9.2   < > 8.9  MG  --   --  2.3  AST 14*  --   --   ALT 10  --   --   ALKPHOS 96  --   --   BILITOT 0.6  --   --    < > = values in this interval not displayed.   RADIOLOGY:  No results found. ASSESSMENT AND PLAN:   1.  Uncontrolled diabetes mellitus from noncompliance Admitted with DKA which is resolved now Hemoglobin A1c 16.9 from noncompliance with medications Seen by diabetic coordinator Reinforced the importance of being compliant with his insulin Lantus 30 units 2 times a day and titrate as needed   2.Pseudohyponatremia from hyperglycemia Improved with correction of hyperglycemia.  Sodium 134   3.Pneumonia-seems to be community-acquired Rocephin and azithromycin  sputum culture and sensitivity Influenza test negative. Respiratory panel -rhinovirus is positive Covid pending patient currently on droplet isolation.  Follow-up on results when available  4.  Mildly elevated troponin probably demand ischemia Telemetry Patient is asymptomatic Echocardiogram will be done as an outpatient as the Covid test is pending at this time Troponin level trended down to less than 0.03.  Patient completely asymptomatic. Patient already seen by  cardiologist Dr. Humphrey Rolls   5. Diabetic neuropathy .continue Lyrica  DVT prophylaxis; Lovenox  All the records are reviewed and case discussed with Care Management/Social Worker. Management plans discussed with the patient, family and they are in agreement.  CODE STATUS: Full Code  TOTAL TIME TAKING CARE OF THIS PATIENT: 35 minutes.   More than 50% of the time was spent in counseling/coordination of care: YES  POSSIBLE D/C IN 1-2 DAYS, DEPENDING ON CLINICAL CONDITION.   Nicholes Mango M.D on 11/27/2018 at 2:52 PM  Between 7am to 6pm - Pager [phone]  After 6pm go to [email or web address] - Proofreader  [facility]  Office  [phone]  CC: Primary care physician; Patient, No Pcp Per  Note: This dictation was prepared with Dragon dictation along with smaller phrase technology. Any transcriptional errors that result from this process are unintentional.

## 2018-11-28 LAB — GLUCOSE, CAPILLARY
Glucose-Capillary: 292 mg/dL — ABNORMAL HIGH (ref 70–99)
Glucose-Capillary: 168 mg/dL — ABNORMAL HIGH (ref 70–99)
Glucose-Capillary: 128 mg/dL — ABNORMAL HIGH (ref 70–99)

## 2018-11-28 LAB — NOVEL CORONAVIRUS, NAA (HOSP ORDER, SEND-OUT TO REF LAB; TAT 18-24 HRS): SARS-CoV-2, NAA: NOT DETECTED

## 2018-11-28 MED ORDER — INSULIN GLARGINE 100 UNIT/ML SOLOSTAR PEN: 33.0000 [IU] | PEN_INJECTOR | Freq: Every day | SUBCUTANEOUS | 11 refills | Status: DC

## 2018-11-28 MED ORDER — ACETAMINOPHEN 325 MG PO TABS: 650.0000 mg | ORAL_TABLET | Freq: Four times a day (QID) | ORAL | Status: AC | PRN

## 2018-11-28 MED ORDER — INSULIN PEN NEEDLE 32G X 4 MM MISC: 8.0000 [IU] | Freq: Three times a day (TID) | 1 refills | Status: DC

## 2018-11-28 MED ORDER — INSULIN ASPART 100 UNIT/ML FLEXPEN: 8.0000 [IU] | PEN_INJECTOR | Freq: Three times a day (TID) | SUBCUTANEOUS | 1 refills | Status: DC

## 2018-11-28 MED ORDER — AZITHROMYCIN 250 MG PO TABS: ORAL_TABLET | ORAL | 0 refills | Status: DC

## 2018-11-28 MED ORDER — INSULIN GLARGINE 100 UNIT/ML ~~LOC~~ SOLN
33.0000 [IU] | Freq: Two times a day (BID) | SUBCUTANEOUS | Status: DC
  Administered 2018-11-28: 09:00:00 33 [IU] via SUBCUTANEOUS
  Filled 2018-11-28 (×3): qty 0.33

## 2018-11-28 MED ORDER — CEFDINIR 300 MG PO CAPS: 300.0000 mg | ORAL_CAPSULE | Freq: Two times a day (BID) | ORAL | 0 refills | Status: DC

## 2018-11-28 MED ORDER — PREGABALIN 100 MG PO CAPS: 100.0000 mg | ORAL_CAPSULE | Freq: Two times a day (BID) | ORAL | 0 refills | Status: DC

## 2018-11-28 NOTE — Discharge Summary (Signed)
[place] [facility] [place] NAME: Christopher [name]    MR#:  [id number]  DATE OF BIRTH:  November 07, 1965  DATE OF ADMISSION:  11/25/2018 ADMITTING PHYSICIAN: Nicholes Mango, MD  DATE OF DISCHARGE: 11/28/18 PRIMARY CARE PHYSICIAN: Patient, No Pcp Per    ADMISSION DIAGNOSIS:  Inguinal adenopathy [R59.0] Diabetic ketoacidosis without coma associated with diabetes mellitus due to underlying condition ([place]) [E08.10] Community acquired pneumonia of left lower lobe of lung ([place]) [J18.1] PNA (pneumonia) [J18.9]  DISCHARGE DIAGNOSIS:  Active Problems:   DKA (diabetic ketoacidoses) (HCC)   PNA (pneumonia) Uncontrolled diabetes mellitus secondary to noncompliance hemoglobin A1c 16.9  SECONDARY DIAGNOSIS:   Past Medical History:  Diagnosis Date  . Anxiety   . Arthritis   . Cancer [facility])    Prostate  . Diabetes mellitus without complication ([place])   . Hyperlipidemia   . Hypertension   . Neuropathy   . Neuropathy   . Sleep apnea     HOSPITAL COURSE:   1.  Uncontrolled diabetes mellitus from noncompliance Admitted with DKA which is resolved now Hemoglobin A1c 16.9 from noncompliance with medications Seen by diabetic coordinator Reinforced the importance of being compliant with his insulin, patient is going to see his new primary care physician Dr. Ouida Sills Lantus 33 units 2 times a day and titrate as needed   2.Pseudohyponatremia from hyperglycemia Improved with correction of hyperglycemia.   Sodium 134   3.Pneumonia-seems to be community-acquired improved with Rocephin and azithromycin, patient is getting discharged with Omnicef and azithromycin sputum culture and sensitivity ordered but sample was not provided Influenza test negative. Respiratory panel -rhinovirus is positive Covid pending patient currently on droplet isolation.  PCP to Follow-up on results when available.  Patient was strictly instructed to self quarantine at home until  test results are available.  Patient verbalized understanding  4.  Mildly elevated troponin probably demand ischemia Telemetry monitoring done Patient is asymptomatic Echocardiogram will be done as an outpatient as the Covid test is pending at this time Troponin level trended down to less than 0.03.  Patient completely asymptomatic. Patient already seen by cardiologist Dr. Humphrey Rolls okay to discharge patient from cardiology standpoint  5. Diabetic neuropathy .continue Lyrica  DVT prophylaxis; Lovenox  DISCHARGE CONDITIONS:   Stable  CONSULTS OBTAINED:  Treatment Team:  Dionisio David, MD   PROCEDURES   DRUG ALLERGIES:   Allergies  Allergen Reactions  . Tramadol Nausea And Vomiting    DISCHARGE MEDICATIONS:   Allergies as of 11/28/2018      Reactions   Tramadol Nausea And Vomiting      Medication List    STOP taking these medications   hydrochlorothiazide 25 MG tablet Commonly known as:  HYDRODIURIL   insulin glargine 100 UNIT/ML injection Commonly known as:  Lantus Replaced by:  Insulin Glargine 100 UNIT/ML Solostar Pen     TAKE these medications   acetaminophen 325 MG tablet Commonly known as:  TYLENOL Take 2 tablets (650 mg total) by mouth every 6 (six) hours as needed for fever.   amLODipine 5 MG tablet Commonly known as:  NORVASC Take 1 tablet (5 mg total) by mouth daily.   aspirin EC 81 MG tablet Take 1 tablet (81 mg total) by mouth daily.   atorvastatin 20 MG tablet Commonly known as:  LIPITOR Take 20 mg by mouth daily at 6 PM.   azithromycin 250 MG tablet Commonly known as:  ZITHROMAX Take 1 tablet p.o. once daily Start taking on:  November 29, 2018   cefdinir 300 MG capsule Commonly known as:  OMNICEF Take 1 capsule (300 mg total) by mouth every 12 (twelve) hours.   cetirizine 10 MG tablet Commonly known as:  ZYRTEC Take 1 tablet (10 mg total) by mouth daily.   cyclobenzaprine 10 MG tablet Commonly known as:  FLEXERIL Take 1 tablet  (10 mg total) by mouth 3 (three) times daily as needed for muscle spasms.   DULoxetine 30 MG capsule Commonly known as:  CYMBALTA Take 1 capsule (30 mg total) by mouth daily.   fluticasone 50 MCG/ACT nasal spray Commonly known as:  Flonase Place 2 sprays into both nostrils daily.   insulin aspart 100 UNIT/ML FlexPen Commonly known as:  NovoLOG FlexPen Inject 8 Units into the skin 3 (three) times daily with meals. What changed:    how much to take  additional instructions   Insulin Glargine 100 UNIT/ML Solostar Pen Commonly known as:  Lantus SoloStar Inject 33 Units into the skin daily. Replaces:  insulin glargine 100 UNIT/ML injection   Insulin Pen Needle 32G X 4 MM Misc 8 Units by Does not apply route 3 (three) times daily before meals.   lisinopril 10 MG tablet Commonly known as:  PRINIVIL,ZESTRIL Take 1 tablet (10 mg total) by mouth daily.   LORazepam 0.5 MG tablet Commonly known as:  ATIVAN Take 1 tablet (0.5 mg total) by mouth daily.   nystatin cream Commonly known as:  MYCOSTATIN Apply topically 2 (two) times daily.   oxyCODONE-acetaminophen 5-325 MG tablet Commonly known as:  PERCOCET/ROXICET Take 2 tablets by mouth every 4 (four) hours as needed for severe pain.   pregabalin 100 MG capsule Commonly known as:  LYRICA Take 1 capsule (100 mg total) by mouth 2 (two) times daily. What changed:  Another medication with the same name was removed. Continue taking this medication, and follow the directions you see here.   terazosin 1 MG capsule Commonly known as:  HYTRIN Take 1 capsule (1 mg total) by mouth daily.        DISCHARGE INSTRUCTIONS:   Self quarantine at home until [facility] test results are available Reinforced the importance of compliant with his insulin\ Follow-up with primary care physician Dr. Ouida Sills in 3 days call and make an appointment   DIET:  Diabetic diet  DISCHARGE CONDITION:  Stable  ACTIVITY:  Activity as  tolerated  OXYGEN:  Home Oxygen: No.   Oxygen Delivery: room air  DISCHARGE LOCATION:  home   If you experience worsening of your admission symptoms, develop shortness of breath, life threatening emergency, suicidal or homicidal thoughts you must seek medical attention immediately by calling 911 or calling your MD immediately  if symptoms less severe.  You Must read complete instructions/literature along with all the possible adverse reactions/side effects for all the Medicines you take and that have been prescribed to you. Take any new Medicines after you have completely understood and accpet all the possible adverse reactions/side effects.   Please note  You were cared for by a hospitalist during your hospital stay. If you have any questions about your discharge medications or the care you received while you were in the hospital after you are discharged, you can call the unit and asked to speak with the hospitalist on call if the hospitalist that took care of you is not available. Once you are discharged, your primary care physician will handle any further medical issues. Please note that NO REFILLS for any discharge medications will be  authorized once you are discharged, as it is imperative that you return to your primary care physician (or establish a relationship with a primary care physician if you do not have one) for your aftercare needs so that they can reassess your need for medications and monitor your lab values.     Today  Chief Complaint  Patient presents with  . Emesis  . Hyperglycemia    Patient is feeling fine tolerating diet denies any complaints wants to go home.  Case management has seen the patient regarding medication assistance  ROS:  CONSTITUTIONAL: Denies fevers, chills. Denies any fatigue, weakness.  EYES: Denies blurry vision, double vision, eye pain. EARS, NOSE, THROAT: Denies tinnitus, ear pain, hearing loss. RESPIRATORY: Denies cough, wheeze, shortness  of breath.  CARDIOVASCULAR: Denies chest pain, palpitations, edema.  GASTROINTESTINAL: Denies nausea, vomiting, diarrhea, abdominal pain. Denies bright red blood per rectum. GENITOURINARY: Denies dysuria, hematuria. ENDOCRINE: Denies nocturia or thyroid problems. HEMATOLOGIC AND LYMPHATIC: Denies easy bruising or bleeding. SKIN: Denies rash or lesion. MUSCULOSKELETAL: Denies pain in neck, back, shoulder, knees, hips or arthritic symptoms.  NEUROLOGIC: Denies paralysis, paresthesias.  PSYCHIATRIC: Denies anxiety or depressive symptoms.   VITAL SIGNS:  Blood pressure 111/72, pulse 78, temperature 98 F (36.7 C), resp. rate 19, height 5\' 9"  (1.753 m), weight 72.6 kg, SpO2 98 %.  I/O:    Intake/Output Summary (Last 24 hours) at 11/28/2018 1330 Last data filed at 11/28/2018 0800 Gross per 24 hour  Intake 120 ml  Output 1500 ml  Net -1380 ml    PHYSICAL EXAMINATION:  GENERAL:  53 y.o.-year-old patient lying in the bed with no acute distress.  EYES: Pupils equal, round, reactive to light and accommodation. No scleral icterus. Extraocular muscles intact.  HEENT: Head atraumatic, normocephalic. Oropharynx and nasopharynx clear.  NECK:  Supple, no jugular venous distention. No thyroid enlargement, no tenderness.  LUNGS: Normal breath sounds bilaterally, no wheezing, rales,rhonchi or crepitation. No use of accessory muscles of respiration.  CARDIOVASCULAR: S1, S2 normal. No murmurs, rubs, or gallops.  ABDOMEN: Soft, non-tender, non-distended. Bowel sounds present. EXTREMITIES: No pedal edema, cyanosis, or clubbing.  NEUROLOGIC: Awake, alert and oriented x3. sensation intact. Gait not checked.  PSYCHIATRIC: The patient is alert and oriented x 3.  SKIN: No obvious rash, lesion, or ulcer.   DATA REVIEW:   CBC Recent Labs  Lab 11/27/18 0443  WBC 11.1*  HGB 10.4*  HCT 32.6*  PLT 321    Chemistries  Recent Labs  Lab 11/25/18 1712  11/27/18 0443  NA 129*   < > 134*  K 3.6   < >  3.6  CL 95*   < > 100  CO2 24   < > 26  GLUCOSE 287*   < > 187*  BUN 8   < > 14  CREATININE 0.89   < > 0.68  CALCIUM 9.2   < > 8.9  MG  --   --  2.3  AST 14*  --   --   ALT 10  --   --   ALKPHOS 96  --   --   BILITOT 0.6  --   --    < > = values in this interval not displayed.    Cardiac Enzymes Recent Labs  Lab 11/26/18 0106  TROPONINI <0.03    Microbiology Results  Results for orders placed or performed during the hospital encounter of 11/25/18  Respiratory Panel by PCR     Status: Abnormal   Collection Time:  11/25/18  2:29 PM  Result Value Ref Range Status   Adenovirus NOT DETECTED NOT DETECTED Final   Coronavirus 229E NOT DETECTED NOT DETECTED Final    Comment: (NOTE) The Coronavirus on the Respiratory Panel, DOES NOT test for the novel  Coronavirus (2019 nCoV)    Coronavirus HKU1 NOT DETECTED NOT DETECTED Final   Coronavirus NL63 NOT DETECTED NOT DETECTED Final   Coronavirus OC43 NOT DETECTED NOT DETECTED Final   Metapneumovirus NOT DETECTED NOT DETECTED Final   Rhinovirus / Enterovirus DETECTED (A) NOT DETECTED Final   Influenza A NOT DETECTED NOT DETECTED Final   Influenza B NOT DETECTED NOT DETECTED Final   Parainfluenza Virus 1 NOT DETECTED NOT DETECTED Final   Parainfluenza Virus 2 NOT DETECTED NOT DETECTED Final   Parainfluenza Virus 3 NOT DETECTED NOT DETECTED Final   Parainfluenza Virus 4 NOT DETECTED NOT DETECTED Final   Respiratory Syncytial Virus NOT DETECTED NOT DETECTED Final   Bordetella pertussis NOT DETECTED NOT DETECTED Final   Chlamydophila pneumoniae NOT DETECTED NOT DETECTED Final   Mycoplasma pneumoniae NOT DETECTED NOT DETECTED Final    Comment: Performed at [facility], [place] [address] [place], [place] [address]    RADIOLOGY:  Dg Chest 1 View  Result Date: 11/25/2018 CLINICAL DATA:  Nausea and vomiting. Generalized weakness for the past 2 days. EXAM: CHEST  1 VIEW COMPARISON:  None. FINDINGS: Normal cardiac silhouette and  mediastinal contours. Left basilar heterogeneous opacities. No discrete focal airspace opacities. No pleural effusion or pneumothorax. No evidence of edema. No acute osseous abnormalities. Degenerative change of the bilateral acromioclavicular joints. IMPRESSION: Left basilar heterogeneous opacities may represent atelectasis or scarring though developing airspace opacity could have a similar appearance. Further evaluation with a PA and lateral chest radiograph may be obtained as clinically indicated. Electronically Signed   By: Sandi Mariscal M.D.   On: 11/25/2018 12:23    EKG:   Orders placed or performed during the hospital encounter of 11/25/18  . EKG 12-Lead  . EKG 12-Lead      Management plans discussed with the patient, he is in agreement.  CODE STATUS:     Code Status Orders  (From admission, onward)         Start     Ordered   11/25/18 2201  Full code  Continuous     11/25/18 2200        Code Status History    Date Active Date Inactive Code Status Order ID Comments User Context   02/01/2017 0630 02/02/2017 1639 Full Code [id number]  Harrie Foreman, MD Inpatient   07/04/2015 2354 07/06/2015 2235 Full Code [id number]  Hillary Bow, MD ED      TOTAL TIME TAKING CARE OF THIS PATIENT: 43  minutes.   Note: This dictation was prepared with Dragon dictation along with smaller phrase technology. Any transcriptional errors that result from this process are unintentional.   @[facility] @  on 11/28/2018 at 1:30 PM  Between 7am to 6pm - Pager - [phone]  After 6pm go to [email or web address] - password [email or web address] [facility]  Office  [phone]  CC: Primary care physician; Patient, No Pcp Per

## 2018-11-28 NOTE — Progress Notes (Signed)
Patient discharged to home.  Patient verbalizes understanding of instructions for covid quarantine until notified by physician.  Tele and IV d/c'd.  Patient states has all belongings.

## 2018-11-28 NOTE — TOC Transition Note (Signed)
Transition of Care [facility]) - CM/SW Discharge Note   Patient Details  Name: Venson Ferencz MRN: [id number] Date of Birth: 1965-12-13  [facility] CM/SW Contact:  Elza Rafter, RN Phone Number: 11/28/2018, 12:47 PM   Clinical Narrative:   Secretary making PCP appointment with Dr. Ouida Sills at [facility] [place] prior to DC.  No further needs identified at this time by case management.    Final next level of care: Home/Self Care Barriers to Discharge: No Barriers Identified   Patient Goals and CMS Choice        Discharge Placement                       Discharge Plan and Services   Discharge Planning Services: CM Consult, Follow-up appt scheduled                HH Arranged: Patient Refused HH     Social Determinants of Health (SDOH) Interventions     Readmission Risk Interventions Readmission Risk Prevention Plan 11/26/2018  Post Dischage Appt Complete  Medication Screening Complete  Transportation Screening Complete  Some recent data might be hidden

## 2018-11-28 NOTE — Progress Notes (Signed)
Inpatient Diabetes Program Recommendations  AACE/ADA: New Consensus Statement on Inpatient Glycemic Control  Target Ranges:  Prepandial:   less than 140 mg/dL      Peak postprandial:   less than 180 mg/dL (1-2 hours)      Critically ill patients:  140 - 180 mg/dL   Results for Christopher, [name] (MRN [id number]) as of 11/28/2018 06:41  Ref. Range 11/27/2018 08:27 11/27/2018 11:36 11/27/2018 17:05 11/27/2018 20:38 11/28/2018 03:42  Glucose-Capillary Latest Ref Range: 70 - 99 mg/dL 127 (H) 230 (H) 198 (H) 166 (H) 292 (H)   Review of Glycemic Control  Diabetes history: DM2 Outpatient Diabetes medications: Lantus 40 units QHS, Novolog 20 units with meals plus additional units for correction (patient reports being out of both insulin for several months) Current orders for Inpatient glycemic control: Lantus 30 units BID, Novolog 8 units TID with meals for meal coverage, Novolog 0-15 units AC&HS   Inpatient Diabetes Program Recommendations:   Insulin-Basal: Please consider increasing Lantus to 33 units BID.  HgbA1C: A1C 16.9% on 11/27/18 indicating an average glucose of 438 mg/dl.  Patient notes that he has been out of insulin for months and that he does not have a PCP.  At time of discharge: please provide Rx for: Lantus SoloStar pens(#[id number]), Novolog FlexPens [phone]), and insulin pen needles [phone]).   Thanks, Barnie Alderman, RN, MSN, CDE Diabetes Coordinator Inpatient Diabetes Program [phone] (Team Pager)

## 2018-11-28 NOTE — Discharge Instructions (Signed)
Self quarantine at home until [facility] test results are available Reinforced the importance of compliant with his insulin\ Follow-up with primary care physician Dr. Ouida Sills in 3 days call and make an appointment

## 2018-12-01 ENCOUNTER — Telehealth: Payer: Self-pay | Admitting: Emergency Medicine

## 2018-12-01 NOTE — Telephone Encounter (Addendum)
Called patient to inform of covid 19 negative results.  No answer and voicemail is full.  Patient called back and I gave result.

## 2018-12-07 LAB — BLOOD GAS, ARTERIAL
Acid-Base Excess: 3.5 mmol/L — ABNORMAL HIGH (ref 0.0–2.0)
Bicarbonate: 27.8 mmol/L (ref 20.0–28.0)
O2 Saturation: 95.5 %
Patient temperature: 37
pCO2 arterial: 40 mmHg (ref 32.0–48.0)
pH, Arterial: 7.45 (ref 7.350–7.450)
pO2, Arterial: 75 mmHg — ABNORMAL LOW (ref 83.0–108.0)

## 2019-06-10 ENCOUNTER — Observation Stay
Admission: EM | Admit: 2019-06-10 | Discharge: 2019-06-12 | Disposition: A | Discharge status: Home / Self Care | Payer: Medicaid Other | Attending: Internal Medicine | Admitting: Internal Medicine

## 2019-06-10 ENCOUNTER — Encounter: Payer: Self-pay | Admitting: Emergency Medicine

## 2019-06-10 DIAGNOSIS — E1142 Type 2 diabetes mellitus with diabetic polyneuropathy: Secondary | ICD-10-CM | POA: Insufficient documentation

## 2019-06-10 DIAGNOSIS — F419 Anxiety disorder, unspecified: Secondary | ICD-10-CM | POA: Insufficient documentation

## 2019-06-10 DIAGNOSIS — Z885 Allergy status to narcotic agent status: Secondary | ICD-10-CM | POA: Diagnosis not present

## 2019-06-10 DIAGNOSIS — E1165 Type 2 diabetes mellitus with hyperglycemia: Secondary | ICD-10-CM | POA: Diagnosis not present

## 2019-06-10 DIAGNOSIS — E785 Hyperlipidemia, unspecified: Secondary | ICD-10-CM | POA: Diagnosis not present

## 2019-06-10 DIAGNOSIS — Z20828 Contact with and (suspected) exposure to other viral communicable diseases: Secondary | ICD-10-CM | POA: Insufficient documentation

## 2019-06-10 DIAGNOSIS — Z8673 Personal history of transient ischemic attack (TIA), and cerebral infarction without residual deficits: Secondary | ICD-10-CM | POA: Diagnosis not present

## 2019-06-10 DIAGNOSIS — R739 Hyperglycemia, unspecified: Secondary | ICD-10-CM | POA: Diagnosis present

## 2019-06-10 DIAGNOSIS — Z8546 Personal history of malignant neoplasm of prostate: Secondary | ICD-10-CM | POA: Insufficient documentation

## 2019-06-10 DIAGNOSIS — Z7982 Long term (current) use of aspirin: Secondary | ICD-10-CM | POA: Insufficient documentation

## 2019-06-10 DIAGNOSIS — R252 Cramp and spasm: Secondary | ICD-10-CM | POA: Diagnosis not present

## 2019-06-10 DIAGNOSIS — Z9119 Patient's noncompliance with other medical treatment and regimen: Secondary | ICD-10-CM | POA: Insufficient documentation

## 2019-06-10 DIAGNOSIS — G4730 Sleep apnea, unspecified: Secondary | ICD-10-CM | POA: Insufficient documentation

## 2019-06-10 DIAGNOSIS — M1990 Unspecified osteoarthritis, unspecified site: Secondary | ICD-10-CM | POA: Diagnosis not present

## 2019-06-10 DIAGNOSIS — I10 Essential (primary) hypertension: Secondary | ICD-10-CM | POA: Insufficient documentation

## 2019-06-10 DIAGNOSIS — Z794 Long term (current) use of insulin: Secondary | ICD-10-CM | POA: Insufficient documentation

## 2019-06-10 DIAGNOSIS — Z79899 Other long term (current) drug therapy: Secondary | ICD-10-CM | POA: Diagnosis not present

## 2019-06-10 LAB — CBC
HCT: 42.9 % (ref 39.0–52.0)
Hemoglobin: 14.3 g/dL (ref 13.0–17.0)
MCH: 26.8 pg (ref 26.0–34.0)
MCHC: 33.3 g/dL (ref 30.0–36.0)
MCV: 80.5 fL (ref 80.0–100.0)
Platelets: 326 10*3/uL (ref 150–400)
RBC: 5.33 MIL/uL (ref 4.22–5.81)
RDW: 12.3 % (ref 11.5–15.5)
WBC: 8.2 10*3/uL (ref 4.0–10.5)
nRBC: 0 % (ref 0.0–0.2)

## 2019-06-10 LAB — URINALYSIS, COMPLETE (UACMP) WITH MICROSCOPIC
Bacteria, UA: NONE SEEN
Bilirubin Urine: NEGATIVE
Glucose, UA: >=500 mg/dL — AB
Ketones, ur: NEGATIVE mg/dL
Leukocytes,Ua: NEGATIVE
Nitrite: NEGATIVE
Protein, ur: NEGATIVE mg/dL
Specific Gravity, Urine: 1.032 — ABNORMAL HIGH (ref 1.005–1.030)
Squamous Epithelial / HPF: NONE SEEN (ref 0–5)
pH: 5 (ref 5.0–8.0)

## 2019-06-10 LAB — BASIC METABOLIC PANEL
Anion gap: 12 (ref 5–15)
BUN: 18 mg/dL (ref 6–20)
CO2: 24 mmol/L (ref 22–32)
Calcium: 10.3 mg/dL (ref 8.9–10.3)
Chloride: 94 mmol/L — ABNORMAL LOW (ref 98–111)
Creatinine, Ser: 1.2 mg/dL (ref 0.61–1.24)
GFR calc Af Amer: >60 mL/min (ref >60)
GFR calc non Af Amer: >60 mL/min (ref >60)
Glucose, Bld: 580 mg/dL (ref 70–99)
Potassium: 4.6 mmol/L (ref 3.5–5.1)
Sodium: 130 mmol/L — ABNORMAL LOW (ref 135–145)

## 2019-06-10 LAB — GLUCOSE, CAPILLARY
Glucose-Capillary: 513 mg/dL (ref 70–99)
Glucose-Capillary: 450 mg/dL — ABNORMAL HIGH (ref 70–99)

## 2019-06-10 LAB — MAGNESIUM: Magnesium: 2.5 mg/dL — ABNORMAL HIGH (ref 1.7–2.4)

## 2019-06-10 MED ORDER — SODIUM CHLORIDE 0.9 % IV SOLN
INTRAVENOUS | Status: DC
  Administered 2019-06-11 – 2019-06-12 (×3): via INTRAVENOUS

## 2019-06-10 MED ORDER — ONDANSETRON HCL 4 MG PO TABS: 4.0000 mg | ORAL_TABLET | Freq: Four times a day (QID) | ORAL | Status: DC | PRN

## 2019-06-10 MED ORDER — TRAZODONE HCL 50 MG PO TABS
25.0000 mg | ORAL_TABLET | Freq: Every evening | ORAL | Status: DC | PRN
  Filled 2019-06-10: qty 1

## 2019-06-10 MED ORDER — INSULIN ASPART 100 UNIT/ML ~~LOC~~ SOLN
0.0000 [IU] | SUBCUTANEOUS | Status: DC
  Administered 2019-06-11: 3 [IU] via SUBCUTANEOUS
  Administered 2019-06-11: 11 [IU] via SUBCUTANEOUS
  Administered 2019-06-11: 7 [IU] via SUBCUTANEOUS
  Administered 2019-06-11: 4 [IU] via SUBCUTANEOUS
  Administered 2019-06-12: 11 [IU] via SUBCUTANEOUS
  Administered 2019-06-12: 3 [IU] via SUBCUTANEOUS
  Filled 2019-06-10 (×6): qty 1

## 2019-06-10 MED ORDER — ACETAMINOPHEN 325 MG PO TABS
650.0000 mg | ORAL_TABLET | Freq: Four times a day (QID) | ORAL | Status: DC | PRN
  Administered 2019-06-11: 650 mg via ORAL
  Filled 2019-06-10: qty 2

## 2019-06-10 MED ORDER — ENOXAPARIN SODIUM 40 MG/0.4ML ~~LOC~~ SOLN
40.0000 mg | SUBCUTANEOUS | Status: DC
  Administered 2019-06-11 (×2): 40 mg via SUBCUTANEOUS
  Filled 2019-06-10 (×2): qty 0.4

## 2019-06-10 MED ORDER — LACTATED RINGERS IV BOLUS
1000.0000 mL | Freq: Once | INTRAVENOUS | Status: AC
  Administered 2019-06-10: 1000 mL via INTRAVENOUS

## 2019-06-10 MED ORDER — INSULIN ASPART 100 UNIT/ML ~~LOC~~ SOLN
10.0000 [IU] | Freq: Once | SUBCUTANEOUS | Status: AC
  Administered 2019-06-10: 10 [IU] via SUBCUTANEOUS
  Filled 2019-06-10: qty 1

## 2019-06-10 MED ORDER — INSULIN ASPART 100 UNIT/ML ~~LOC~~ SOLN: 10.0000 [IU] | Freq: Once | SUBCUTANEOUS | Status: DC

## 2019-06-10 MED ORDER — ACETAMINOPHEN 650 MG RE SUPP: 650.0000 mg | Freq: Four times a day (QID) | RECTAL | Status: DC | PRN

## 2019-06-10 MED ORDER — MAGNESIUM HYDROXIDE 400 MG/5ML PO SUSP: 30.0000 mL | Freq: Every day | ORAL | Status: DC | PRN

## 2019-06-10 MED ORDER — ONDANSETRON HCL 4 MG/2ML IJ SOLN
4.0000 mg | Freq: Four times a day (QID) | INTRAMUSCULAR | Status: DC | PRN
  Administered 2019-06-11: 4 mg via INTRAVENOUS
  Filled 2019-06-10: qty 2

## 2019-06-10 MED ORDER — SODIUM CHLORIDE 0.9 % IV BOLUS
1000.0000 mL | Freq: Once | INTRAVENOUS | Status: AC
  Administered 2019-06-10: 1000 mL via INTRAVENOUS

## 2019-06-10 NOTE — ED Notes (Signed)
MD at the bedside  

## 2019-06-10 NOTE — ED Notes (Addendum)
Pt states that around 1300 today the neuropathy in his hands and feet began to worsen. States that around 1800 tonight, his arms, hands, and feet began to cramp and he barely able to feel them. States that he is experiencing tingling and numbness throughout his hands and feet. Pt states that he checked his glucose before he was about to eat tonight, and states that his machine said "high". States that starting last night, he began to urinate more often than usual about every 30 minutes.

## 2019-06-10 NOTE — ED Triage Notes (Addendum)
pt arrived via EMS from home where pt c/o bilateral cramping in all extremities as well as hyperglycemia x1 day. Pt received 421ml NS in route by EMS due to CBG reading HIGH.

## 2019-06-10 NOTE — ED Provider Notes (Signed)
[facility] Emergency Department Provider Note  ____________________________________________   First MD Initiated Contact with Patient 06/10/19 2142     (approximate)  I have reviewed the triage vital signs and the nursing notes.   HISTORY  Chief Complaint Hyperglycemia    HPI Christopher [name] is a 53 y.o. male with diabetes, hypertension, hyperlipidemia who presents with sugars.  Patient says that normally his sugars run in the 300s however today they were reading greater than 500.  He endorses neuropathy in his hands and his feet that is severe, intermittent, nothing makes it better, nothing makes it worse.  He is unsure what insulin he has been taking but says he takes a long-acting in the morning at night and sliding scale with meals.  Patient was admitted in April for DKA  He got 450 mL of normal saline with EMS.  Per patient's wife he is on 50 units of Lantus in the morning, 30 units of Lantus in the night and uses NovoLog about 10 units with each meal.            Past Medical History:  Diagnosis Date  . Anxiety   . Arthritis   . Cancer [facility])    Prostate  . Diabetes mellitus without complication ([place])   . Hyperlipidemia   . Hypertension   . Neuropathy   . Neuropathy   . Sleep apnea     Patient Active Problem List   Diagnosis Date Noted  . DKA (diabetic ketoacidoses) ([place]) 11/25/2018  . PNA (pneumonia) 11/25/2018  . TIA (transient ischemic attack) 02/01/2017  . Cramps, muscle, general 07/04/2015    Past Surgical History:  Procedure Laterality Date  . APPENDECTOMY    . PROSTATECTOMY      Prior to Admission medications   Medication Sig Start Date End Date Taking? Authorizing Provider  acetaminophen (TYLENOL) 325 MG tablet Take 2 tablets (650 mg total) by mouth every 6 (six) hours as needed for fever. 11/28/18   Gouru, Illene Silver, MD  amLODipine (NORVASC) 5 MG tablet Take 1 tablet (5 mg total) by mouth daily. 02/02/17   Bettey Costa,  MD  aspirin EC 81 MG tablet Take 1 tablet (81 mg total) by mouth daily. 02/02/17   Bettey Costa, MD  atorvastatin (LIPITOR) 20 MG tablet Take 20 mg by mouth daily at 6 PM.    [provider]  azithromycin (ZITHROMAX) 250 MG tablet Take 1 tablet p.o. once daily 11/29/18   Gouru, Illene Silver, MD  cefdinir (OMNICEF) 300 MG capsule Take 1 capsule (300 mg total) by mouth every 12 (twelve) hours. 11/28/18   Nicholes Mango, MD  cetirizine (ZYRTEC) 10 MG tablet Take 1 tablet (10 mg total) by mouth daily. 02/02/17   Bettey Costa, MD  cyclobenzaprine (FLEXERIL) 10 MG tablet Take 1 tablet (10 mg total) by mouth 3 (three) times daily as needed for muscle spasms. 07/06/15   Demetrios Loll, MD  DULoxetine (CYMBALTA) 30 MG capsule Take 1 capsule (30 mg total) by mouth daily. 02/02/17   Bettey Costa, MD  fluticasone (FLONASE) 50 MCG/ACT nasal spray Place 2 sprays into both nostrils daily. 01/10/15 11/25/18  Johnn Hai, PA-C  insulin aspart (NOVOLOG FLEXPEN) 100 UNIT/ML FlexPen Inject 8 Units into the skin 3 (three) times daily with meals. 11/28/18   Gouru, Illene Silver, MD  Insulin Glargine (LANTUS SOLOSTAR) 100 UNIT/ML Solostar Pen Inject 33 Units into the skin daily. 11/28/18   Nicholes Mango, MD  Insulin Pen Needle 32G X 4 MM MISC 8  Units by Does not apply route 3 (three) times daily before meals. 11/28/18   Gouru, Illene Silver, MD  lisinopril (PRINIVIL,ZESTRIL) 10 MG tablet Take 1 tablet (10 mg total) by mouth daily. 02/02/17   Bettey Costa, MD  LORazepam (ATIVAN) 0.5 MG tablet Take 1 tablet (0.5 mg total) by mouth daily. 02/02/17   Bettey Costa, MD  nystatin cream (MYCOSTATIN) Apply topically 2 (two) times daily. 02/02/17   Bettey Costa, MD  oxyCODONE-acetaminophen (PERCOCET/ROXICET) 5-325 MG tablet Take 2 tablets by mouth every 4 (four) hours as needed for severe pain. Patient not taking: Reported on 11/25/2018 02/16/18   Loney Hering, MD  pregabalin (LYRICA) 100 MG capsule Take 1 capsule (100 mg total) by mouth 2 (two) times daily.  11/28/18   Gouru, Illene Silver, MD  terazosin (HYTRIN) 1 MG capsule Take 1 capsule (1 mg total) by mouth daily. 02/02/17   Bettey Costa, MD    Allergies Tramadol  Family History  Problem Relation Age of Onset  . Cancer Father   . Stroke Father     Social History Social History   Tobacco Use  . Smoking status: Never Smoker  . Smokeless tobacco: Never Used  Substance Use Topics  . Alcohol use: No  . Drug use: No      Review of Systems Constitutional: No fever/chills Eyes: No visual changes. ENT: No sore throat. Cardiovascular: Denies chest pain. Respiratory: Denies shortness of breath. Gastrointestinal: No abdominal pain.  No nausea, no vomiting.  No diarrhea.  No constipation. Genitourinary: Negative for dysuria.  Increased urination Musculoskeletal: Negative for back pain. Skin: Negative for rash. Neurological: Negative for headaches, focal weakness or numbness.   neuropathy All other ROS negative ____________________________________________   PHYSICAL EXAM:  VITAL SIGNS: ED Triage Vitals [06/10/19 2017]  Enc Vitals Group     BP 137/87     Pulse Rate 85     Resp 18     Temp 99.6 F (37.6 C)     Temp Source Oral     SpO2 98 %     Weight      Height      Head Circumference      Peak Flow      Pain Score      Pain Loc      Pain Edu?      Excl. in [place]?     Constitutional: Alert and oriented. Well appearing and in no acute distress. Eyes: Conjunctivae are normal. EOMI. Head: Atraumatic. Nose: No congestion/rhinnorhea. Mouth/Throat: Mucous membranes are moist.   Neck: No stridor. Trachea Midline. FROM Cardiovascular: Normal rate, regular rhythm. Grossly normal heart sounds.  Good peripheral circulation. Respiratory: Normal respiratory effort.  No retractions. Lungs CTAB. Gastrointestinal: Soft and nontender. No distention. No abdominal bruits.  Musculoskeletal: No lower extremity tenderness nor edema.  No joint effusions. Neurologic:  Normal speech and  language. No gross focal neurologic deficits are appreciated.  Skin:  Skin is warm, dry and intact. No rash noted. Psychiatric: Mood and affect are normal. Speech and behavior are normal. GU: Deferred   ____________________________________________   LABS (all labs ordered are listed, but only abnormal results are displayed)  Labs Reviewed  BASIC METABOLIC PANEL - Abnormal; Notable for the following components:      Result Value   Sodium 130 (*)    Chloride 94 (*)    Glucose, Bld 580 (*)    All other components within normal limits  MAGNESIUM - Abnormal; Notable for the following components:   Magnesium  2.5 (*)    All other components within normal limits  GLUCOSE, CAPILLARY - Abnormal; Notable for the following components:   Glucose-Capillary 523 (*)    All other components within normal limits  GLUCOSE, CAPILLARY - Abnormal; Notable for the following components:   Glucose-Capillary 513 (*)    All other components within normal limits  CBC  URINALYSIS, COMPLETE (UACMP) WITH MICROSCOPIC  CBG MONITORING, ED   ____________________________________________  INITIAL IMPRESSION / ASSESSMENT AND PLAN / ED COURSE  Kylil Chiari was evaluated in Emergency Department on 06/10/2019 for the symptoms described in the history of present illness. He was evaluated in the context of the global COVID-19 pandemic, which necessitated consideration that the patient might be at risk for infection with the SARS-CoV-2 virus that causes COVID-19. Institutional protocols and algorithms that pertain to the evaluation of patients at risk for COVID-19 are in a state of rapid change based on information released by regulatory bodies including the CDC and federal and state organizations. These policies and algorithms were followed during the patient's care in the ED.    Patient is a 53 year old who presents with elevated sugars and neuropathy in his extremities.  This is most likely secondary to his high  sugars.  Will get labs evaluate for DKA, electrolyte abnormalities such as hypokalemia, hyperkalemia hypomagnesium.  Will get labs also evaluate for AKI.   We will fluid resuscitate patient and give 10 units of subcu insulin.  Sugar of 513.  Negative anion gap.  Given patient's history of DKA and the severely elevated sugars will discuss to the hospital team for admission to make sure he can have his medications adjusted.    ____________________________________________   FINAL CLINICAL IMPRESSION(S) / ED DIAGNOSES   Final diagnoses:  Hyperglycemia      MEDICATIONS GIVEN DURING THIS VISIT:  Medications  lactated ringers bolus 1,000 mL (has no administration in time range)  sodium chloride 0.9 % bolus 1,000 mL (0 mLs Intravenous Stopped 06/10/19 2310)  insulin aspart (novoLOG) injection 10 Units (10 Units Subcutaneous Given 06/10/19 2309)     ED Discharge Orders    None       Note:  This document was prepared using Dragon voice recognition software and may include unintentional dictation errors.   Vanessa [place], MD 06/10/19 [phone]

## 2019-06-11 ENCOUNTER — Other Ambulatory Visit: Payer: Self-pay

## 2019-06-11 LAB — GLUCOSE, CAPILLARY
Glucose-Capillary: 108 mg/dL — ABNORMAL HIGH (ref 70–99)
Glucose-Capillary: 126 mg/dL — ABNORMAL HIGH (ref 70–99)
Glucose-Capillary: 152 mg/dL — ABNORMAL HIGH (ref 70–99)
Glucose-Capillary: 184 mg/dL — ABNORMAL HIGH (ref 70–99)
Glucose-Capillary: 205 mg/dL — ABNORMAL HIGH (ref 70–99)
Glucose-Capillary: 290 mg/dL — ABNORMAL HIGH (ref 70–99)
Glucose-Capillary: 344 mg/dL — ABNORMAL HIGH (ref 70–99)
Glucose-Capillary: 523 mg/dL (ref 70–99)
Glucose-Capillary: 67 mg/dL — ABNORMAL LOW (ref 70–99)

## 2019-06-11 LAB — COMPREHENSIVE METABOLIC PANEL
ALT: 17 U/L (ref 0–44)
AST: 20 U/L (ref 15–41)
Albumin: 3.4 g/dL — ABNORMAL LOW (ref 3.5–5.0)
Alkaline Phosphatase: 51 U/L (ref 38–126)
Anion gap: 8 (ref 5–15)
BUN: 14 mg/dL (ref 6–20)
CO2: 24 mmol/L (ref 22–32)
Calcium: 8.9 mg/dL (ref 8.9–10.3)
Chloride: 107 mmol/L (ref 98–111)
Creatinine, Ser: 0.8 mg/dL (ref 0.61–1.24)
GFR calc Af Amer: >60 mL/min (ref >60)
GFR calc non Af Amer: >60 mL/min (ref >60)
Glucose, Bld: 85 mg/dL (ref 70–99)
Potassium: 3.3 mmol/L — ABNORMAL LOW (ref 3.5–5.1)
Sodium: 139 mmol/L (ref 135–145)
Total Bilirubin: 0.5 mg/dL (ref 0.3–1.2)
Total Protein: 6.3 g/dL — ABNORMAL LOW (ref 6.5–8.1)

## 2019-06-11 LAB — HEMOGLOBIN A1C
Hgb A1c MFr Bld: 13.7 % — ABNORMAL HIGH (ref 4.8–5.6)
Mean Plasma Glucose: 346.49 mg/dL

## 2019-06-11 LAB — SARS CORONAVIRUS 2 (TAT 6-24 HRS): SARS Coronavirus 2: NEGATIVE

## 2019-06-11 MED ORDER — CYCLOBENZAPRINE HCL 10 MG PO TABS
10.0000 mg | ORAL_TABLET | Freq: Three times a day (TID) | ORAL | Status: DC | PRN
  Filled 2019-06-11: qty 1

## 2019-06-11 MED ORDER — PREGABALIN 50 MG PO CAPS
100.0000 mg | ORAL_CAPSULE | Freq: Every day | ORAL | Status: DC
  Administered 2019-06-11: 100 mg via ORAL
  Filled 2019-06-11: qty 2

## 2019-06-11 MED ORDER — INSULIN GLARGINE 100 UNIT/ML ~~LOC~~ SOLN
50.0000 [IU] | Freq: Every day | SUBCUTANEOUS | Status: DC
  Filled 2019-06-11: qty 0.5

## 2019-06-11 MED ORDER — INSULIN ASPART 100 UNIT/ML ~~LOC~~ SOLN
5.0000 [IU] | Freq: Three times a day (TID) | SUBCUTANEOUS | Status: DC
  Administered 2019-06-11 – 2019-06-12 (×2): 5 [IU] via SUBCUTANEOUS
  Filled 2019-06-11 (×2): qty 1

## 2019-06-11 MED ORDER — LIVING WELL WITH DIABETES BOOK
Freq: Once | Status: AC
  Administered 2019-06-11: 15:00:00
  Filled 2019-06-11: qty 1

## 2019-06-11 MED ORDER — OXYCODONE-ACETAMINOPHEN 5-325 MG PO TABS
1.0000 | ORAL_TABLET | ORAL | Status: AC
  Administered 2019-06-11: 1 via ORAL
  Filled 2019-06-11: qty 1

## 2019-06-11 MED ORDER — INSULIN GLARGINE 100 UNIT/ML ~~LOC~~ SOLN: 25.0000 [IU] | Freq: Once | SUBCUTANEOUS | Status: DC

## 2019-06-11 MED ORDER — PREGABALIN 50 MG PO CAPS
100.0000 mg | ORAL_CAPSULE | Freq: Every day | ORAL | Status: DC
  Administered 2019-06-11 – 2019-06-12 (×2): 100 mg via ORAL
  Filled 2019-06-11 (×2): qty 2

## 2019-06-11 MED ORDER — PREGABALIN 50 MG PO CAPS: 100.0000 mg | ORAL_CAPSULE | Freq: Every day | ORAL | Status: DC

## 2019-06-11 MED ORDER — INSULIN GLARGINE 100 UNIT/ML ~~LOC~~ SOLN
25.0000 [IU] | Freq: Every day | SUBCUTANEOUS | Status: DC
  Administered 2019-06-11 (×2): 25 [IU] via SUBCUTANEOUS
  Filled 2019-06-11 (×3): qty 0.25

## 2019-06-11 MED ORDER — KETOROLAC TROMETHAMINE 15 MG/ML IJ SOLN
15.0000 mg | Freq: Four times a day (QID) | INTRAMUSCULAR | Status: DC | PRN
  Administered 2019-06-11 – 2019-06-12 (×4): 15 mg via INTRAVENOUS
  Filled 2019-06-11 (×4): qty 1

## 2019-06-11 NOTE — Plan of Care (Signed)
  Problem: Education: Goal: Knowledge of General Education information will improve Description: Including pain rating scale, medication(s)/side effects and non-pharmacologic comfort measures Outcome: Progressing   Problem: Pain Managment: Goal: General experience of comfort will improve Outcome: Progressing   

## 2019-06-11 NOTE — Discharge Instructions (Addendum)
Please reduce you insulin dose by 5 units if you see any blood glucose levels less than 80.  Diabetic diet

## 2019-06-11 NOTE — TOC Initial Note (Signed)
Transition of Care [facility]) - Initial/Assessment Note    Patient Details  Name: Christopher [name] MRN: [id number] Date of Birth: 09/06/1965  Transition of Care [facility]) CM/SW Contact:    Ross Ludwig, LCSW Phone Number: 06/11/2019, 4:14 PM  Clinical Narrative:                  Patient is a 53 year old male who is alert and oriented x4.  CSW was informed that patient does not have a PCP, CSW attempted to contact Medicaid worker to see where his assigned clinic is, CSW had to leave a message with his Medicaid worker [name].  Patient will need to be set up with his PCP.  Expected Discharge Plan: Home/Self Care Barriers to Discharge: Continued Medical Work up   Patient Goals and CMS Choice Patient states their goals for this hospitalization and ongoing recovery are:: To return back home CMS Medicare.gov Compare Post Acute Care list provided to:: Patient    Expected Discharge Plan and Services Expected Discharge Plan: Home/Self Care       Living arrangements for the past 2 months: Single Family Home                 DME Arranged: N/A DME Agency: NA                  Prior Living Arrangements/Services Living arrangements for the past 2 months: Single Family Home Lives with:: Roommate Patient language and need for interpreter reviewed:: Yes Do you feel safe going back to the place where you live?: Yes      Need for Family Participation in Patient Care: No (Comment) Care giver support system in place?: No (comment)   Criminal Activity/Legal Involvement Pertinent to Current Situation/Hospitalization: No - Comment as needed  Activities of Daily Living Home Assistive Devices/Equipment: None ADL Screening (condition at time of admission) Patient's cognitive ability adequate to safely complete daily activities?: Yes Is the patient deaf or have difficulty hearing?: No Does the patient have difficulty seeing, even when wearing glasses/contacts?: No Does the patient have  difficulty concentrating, remembering, or making decisions?: No Patient able to express need for assistance with ADLs?: Yes Does the patient have difficulty dressing or bathing?: No Independently performs ADLs?: Yes (appropriate for developmental age) Does the patient have difficulty walking or climbing stairs?: No Weakness of Legs: None Weakness of Arms/Hands: None  Permission Sought/Granted                  Emotional Assessment Appearance:: Appears stated age   Affect (typically observed): Accepting, Appropriate Orientation: : Oriented to Self, Oriented to Place, Oriented to  Time, Oriented to Situation   Psych Involvement: No (comment)  Admission diagnosis:  Hyperglycemia [R73.9] Patient Active Problem List   Diagnosis Date Noted  . Uncontrolled type 2 diabetes mellitus ([place]) 06/10/2019  . DKA (diabetic ketoacidoses) ([place]) 11/25/2018  . PNA (pneumonia) 11/25/2018  . TIA (transient ischemic attack) 02/01/2017  . Cramps, muscle, general 07/04/2015   PCP:  Patient, No Pcp Per Pharmacy:   [facility] [address], [place] - [place] [address] Phone: [phone] Fax: [place] [address]), [place]) [place] [address] Phone: [phone] Fax: [phone]     Social Determinants of Health (SDOH) Interventions    Readmission Risk Interventions Readmission Risk Prevention Plan 11/26/2018  Post Dischage Appt Complete  Medication Screening Complete  Transportation Screening Complete  Some recent data might be hidden

## 2019-06-11 NOTE — H&P (Addendum)
[place] [facility] [place] NAME: Christopher [name]    MR#:  [id number]  DATE OF BIRTH:  05-Feb-1966  DATE OF ADMISSION:  06/10/2019  PRIMARY CARE PHYSICIAN: Patient, No Pcp Per   REQUESTING/REFERRING PHYSICIAN: Marjean Donna, MD  CHIEF COMPLAINT:   Chief Complaint  Patient presents with  . Hyperglycemia    HISTORY OF PRESENT ILLNESS:  Christopher [name] is a 53 y.o. AA male with a known history of HTN, type 2 DM, dyslipidemia and peripheral neuropathy, who presented to the ER with acute onset of cramps in his feet and hands. He has been having polyuria and polydipsia lately and blood sugars were reading high. He didn't miss his insulin but has two days left. He has no pcp currently. No fever or chills, nausea or vomiting or abdominal pain or diarrhea. No chest pain or cough or dyspnea. No dysuria or hematuria or flank pain.   When he caMe to the ER, vital signs were within normal. LABS revealed blood glucose of 580 with NA 130, Chloride 94. CBC within normal. UA with glucosuria and 21-50 rbcs.   The patient was given 2 liter bolus of IV normal saline and 10 units SQ Novolog. He will be admitted to an observation medically monitored bed for further evaluation and management.   PAST MEDICAL HISTORY:   Past Medical History:  Diagnosis Date  . Anxiety   . Arthritis   . Cancer [facility])    Prostate  . Diabetes mellitus without complication ([place])   . Hyperlipidemia   . Hypertension   . Neuropathy   . Neuropathy   . Sleep apnea     PAST SURGICAL HISTORY:   Past Surgical History:  Procedure Laterality Date  . APPENDECTOMY    . PROSTATECTOMY      SOCIAL HISTORY:   Social History   Tobacco Use  . Smoking status: Never Smoker  . Smokeless tobacco: Never Used  Substance Use Topics  . Alcohol use: No    FAMILY HISTORY:   Family History  Problem Relation Age of Onset  . Cancer Father   . Stroke Father     DRUG ALLERGIES:   Allergies   Allergen Reactions  . Tramadol Nausea And Vomiting    REVIEW OF SYSTEMS:   ROS As per history of present illness. All pertinent systems were reviewed above. Constitutional,  HEENT, cardiovascular, respiratory, GI, GU, musculoskeletal, neuro, psychiatric, endocrine,  integumentary and hematologic systems were reviewed and are otherwise  negative/unremarkable except for positive findings mentioned above in the HPI.   MEDICATIONS AT HOME:   Prior to Admission medications   Medication Sig Start Date End Date Taking? Authorizing Provider  insulin aspart (NOVOLOG FLEXPEN) 100 UNIT/ML FlexPen Inject 8 Units into the skin 3 (three) times daily with meals. Patient taking differently: Inject 0-10 Units into the skin 3 (three) times daily with meals. Use per sliding scale 11/28/18  Yes Gouru, Aruna, MD  Insulin Glargine (LANTUS SOLOSTAR) 100 UNIT/ML Solostar Pen Inject 33 Units into the skin daily. Patient taking differently: Inject 50 Units into the skin 2 (two) times daily.  11/28/18  Yes Gouru, Illene Silver, MD  acetaminophen (TYLENOL) 325 MG tablet Take 2 tablets (650 mg total) by mouth every 6 (six) hours as needed for fever. 11/28/18   Gouru, Illene Silver, MD  amLODipine (NORVASC) 5 MG tablet Take 1 tablet (5 mg total) by mouth daily. 02/02/17   Bettey Costa, MD  aspirin EC 81 MG tablet Take 1  tablet (81 mg total) by mouth daily. 02/02/17   Bettey Costa, MD  atorvastatin (LIPITOR) 20 MG tablet Take 20 mg by mouth daily at 6 PM.    [provider]  cetirizine (ZYRTEC) 10 MG tablet Take 1 tablet (10 mg total) by mouth daily. 02/02/17   Bettey Costa, MD  cyclobenzaprine (FLEXERIL) 10 MG tablet Take 1 tablet (10 mg total) by mouth 3 (three) times daily as needed for muscle spasms. 07/06/15   Demetrios Loll, MD  DULoxetine (CYMBALTA) 30 MG capsule Take 1 capsule (30 mg total) by mouth daily. 02/02/17   Bettey Costa, MD  fluticasone (FLONASE) 50 MCG/ACT nasal spray Place 2 sprays into both nostrils daily. 01/10/15  11/25/18  Johnn Hai, PA-C  Insulin Pen Needle 32G X 4 MM MISC 8 Units by Does not apply route 3 (three) times daily before meals. 11/28/18   Gouru, Illene Silver, MD  lisinopril (PRINIVIL,ZESTRIL) 10 MG tablet Take 1 tablet (10 mg total) by mouth daily. 02/02/17   Bettey Costa, MD  LORazepam (ATIVAN) 0.5 MG tablet Take 1 tablet (0.5 mg total) by mouth daily. 02/02/17   Bettey Costa, MD  nystatin cream (MYCOSTATIN) Apply topically 2 (two) times daily. 02/02/17   Bettey Costa, MD  pregabalin (LYRICA) 100 MG capsule Take 1 capsule (100 mg total) by mouth 2 (two) times daily. 11/28/18   Gouru, Illene Silver, MD  terazosin (HYTRIN) 1 MG capsule Take 1 capsule (1 mg total) by mouth daily. 02/02/17   Bettey Costa, MD      VITAL SIGNS:  Blood pressure 124/74, pulse 68, temperature 99.6 F (37.6 C), temperature source Oral, resp. rate 16, SpO2 98 %.  PHYSICAL EXAMINATION:  Physical Exam  GENERAL:  53 y.o.-year-old AA male patient lying in the bed with no acute distress.  EYES: Pupils equal, round, reactive to light and accommodation. No scleral icterus. Extraocular muscles intact.  HEENT: Head atraumatic, normocephalic. Oropharynx with slightly dry tongue and nasopharynx clear.  NECK:  Supple, no jugular venous distention. No thyroid enlargement, no tenderness.  LUNGS: Normal breath sounds bilaterally, no wheezing, rales,rhonchi or crepitation. No use of accessory muscles of respiration.  CARDIOVASCULAR: Regular rate and rhythm, S1, S2 normal. No murmurs, rubs, or gallops.  ABDOMEN: Soft, nondistended, nontender. Bowel sounds present. No organomegaly or mass.  EXTREMITIES: No pedal edema, cyanosis, or clubbing.  NEUROLOGIC: Cranial nerves II through XII are intact. Muscle strength 5/5 in all extremities. Sensation intact. Gait not checked.  PSYCHIATRIC: The patient is alert and oriented x 3.  Normal affect and good eye contact. SKIN: No obvious rash, lesion, or ulcer.   LABORATORY PANEL:   CBC Recent Labs  Lab  06/10/19 2021  WBC 8.2  HGB 14.3  HCT 42.9  PLT 326   ------------------------------------------------------------------------------------------------------------------  Chemistries  Recent Labs  Lab 06/10/19 2021 06/10/19 2025  NA 130*  --   K 4.6  --   CL 94*  --   CO2 24  --   GLUCOSE 580*  --   BUN 18  --   CREATININE 1.20  --   CALCIUM 10.3  --   MG  --  2.5*   ------------------------------------------------------------------------------------------------------------------  Cardiac Enzymes No results for input(s): TROPONINI in the last 168 hours. ------------------------------------------------------------------------------------------------------------------  RADIOLOGY:  No results found.    IMPRESSION AND PLAN:   1. Uncontrolled type 2 diabetes mellitus  with non ketotic hyperosmolar hyperglycemia.  -The pt will be admitted to a medically monitored bed.  -Will aggressively hydrate with IV normal  saline and place him on resistant  SQ Novolog  supplemental coverage with frequent fsbg measures qh x2 then q4h while continuing his basal coverage with Lantus(that will be cut from 50 to 25 units while npo).  -Will obtain a Hg A1C level.  -Will have a diabetic coordinator and case management consults in am.  2.  Cramps in both feet and hands.  This could be related to dehydration as well as diabetic neuropathy.  We will hydrate with IV normal saline and placed on as needed Flexeril.  S Lyrica will be resumed.  3. Hypertension. Continue amlodipine and coreg.  4.  Dyslipidemia. Continue statin therapy.  5. Peripheral neuropathy. Continue Lyrica.  6. DVT Prophylaxis. SQ Lovenox.    All the records are reviewed and case discussed with ED provider. The plan of care was discussed in details with the patient (and family). I answered all questions. The patient agreed to proceed with the above mentioned plan. Further management will depend upon hospital course.    CODE STATUS: Full code  TOTAL TIME TAKING CARE OF THIS PATIENT: 50 minutes.    Christel Mormon M.D on 06/11/2019 at 12:08 AM  Pager - [phone]  After 6pm go to [email or web address] - Proofreader  [facility]  Office  [phone]  CC: Primary care physician; Patient, No Pcp Per   Note: This dictation was prepared with Dragon dictation along with smaller phrase technology. Any transcriptional errors that result from this process are unintentional.

## 2019-06-11 NOTE — ED Notes (Signed)
ED TO INPATIENT HANDOFF REPORT  ED Nurse Name and Phone #:  [place] Name/Age/Gender Christopher [name] 53 y.o. male Room/Bed: ED04A/ED04A  Code Status   Code Status: Full Code  Home/SNF/Other Home Patient oriented to: self Is this baseline? Yes   Triage Complete: Triage complete  Chief Complaint EMS High Glucose  Triage Note pt arrived via EMS from home where pt c/o bilateral cramping in all extremities as well as hyperglycemia x1 day. Pt received 444ml NS in route by EMS due to CBG reading HIGH.    Allergies Allergies  Allergen Reactions  . Tramadol Nausea And Vomiting    Level of Care/Admitting Diagnosis ED Disposition    ED Disposition Condition [facility]: [place] [[id number]]  Level of Care: Med-Surg [16]  Covid Evaluation: Asymptomatic Screening Protocol (No Symptoms)  Diagnosis: Uncontrolled type 2 diabetes mellitus [facility]) [id number]  Admitting Physician: Christel Mormon [id number]  Attending Physician: Christel Mormon [id number]  PT Class (Do Not Modify): Observation [104]  PT Acc Code (Do Not Modify): Observation [10022]       B Medical/Surgery History Past Medical History:  Diagnosis Date  . Anxiety   . Arthritis   . Cancer [facility])    Prostate  . Diabetes mellitus without complication ([place])   . Hyperlipidemia   . Hypertension   . Neuropathy   . Neuropathy   . Sleep apnea    Past Surgical History:  Procedure Laterality Date  . APPENDECTOMY    . PROSTATECTOMY       A IV Location/Drains/Wounds Patient Lines/Drains/Airways Status   Active Line/Drains/Airways    Name:   Placement date:   Placement time:   Site:   Days:   Peripheral IV 06/10/19 Posterior;Right Wrist   06/10/19    2012    Wrist   1          Intake/Output Last 24 hours No intake or output data in the 24 hours ending 06/11/19 0003  Labs/Imaging Results for orders placed or performed during the hospital encounter of 06/10/19 (from the  past 48 hour(s))  Basic metabolic panel     Status: Abnormal   Collection Time: 06/10/19  8:21 PM  Result Value Ref Range   Sodium 130 (L) 135 - 145 mmol/L   Potassium 4.6 3.5 - 5.1 mmol/L   Chloride 94 (L) 98 - 111 mmol/L   CO2 24 22 - 32 mmol/L   Glucose, Bld 580 (HH) 70 - 99 mg/dL    Comment: CRITICAL RESULT CALLED TO, READ BACK BY AND VERIFIED WITH LISA THOMPSON 06/10/19 @ 2122  MLK    BUN 18 6 - 20 mg/dL   Creatinine, Ser 1.20 0.61 - 1.24 mg/dL   Calcium 10.3 8.9 - 10.3 mg/dL   GFR calc non Af Amer >60 >60 mL/min   GFR calc Af Amer >60 >60 mL/min   Anion gap 12 5 - 15    Comment: Performed at [facility], [address] [place], [place] [address]  CBC     Status: None   Collection Time: 06/10/19  8:21 PM  Result Value Ref Range   WBC 8.2 4.0 - 10.5 K/uL   RBC 5.33 4.22 - 5.81 MIL/uL   Hemoglobin 14.3 13.0 - 17.0 g/dL   HCT 42.9 39.0 - 52.0 %   MCV 80.5 80.0 - 100.0 fL   MCH 26.8 26.0 - 34.0 pg   MCHC 33.3 30.0 - 36.0 g/dL  RDW 12.3 11.5 - 15.5 %   Platelets 326 150 - 400 K/uL   nRBC 0.0 0.0 - 0.2 %    Comment: Performed at [facility], [address] [place], [place] [address]  Glucose, capillary     Status: Abnormal   Collection Time: 06/10/19  8:21 PM  Result Value Ref Range   Glucose-Capillary 523 (HH) 70 - 99 mg/dL   Comment 1 Notify RN    Comment 2 Document in Chart    Comment 3 Repeat Test   Glucose, capillary     Status: Abnormal   Collection Time: 06/10/19  8:23 PM  Result Value Ref Range   Glucose-Capillary 513 (HH) 70 - 99 mg/dL   Comment 1 Notify RN   Magnesium     Status: Abnormal   Collection Time: 06/10/19  8:25 PM  Result Value Ref Range   Magnesium 2.5 (H) 1.7 - 2.4 mg/dL    Comment: Performed at [facility], [address] [place], [place] [address]  Glucose, capillary     Status: Abnormal   Collection Time: 06/10/19  9:48 PM  Result Value Ref Range   Glucose-Capillary 450 (H) 70 - 99 mg/dL  Urinalysis,  Complete w Microscopic     Status: Abnormal   Collection Time: 06/10/19  9:51 PM  Result Value Ref Range   Color, Urine STRAW (A) YELLOW   APPearance CLEAR (A) CLEAR   Specific Gravity, Urine 1.032 (H) 1.005 - 1.030   pH 5.0 5.0 - 8.0   Glucose, UA >=500 (A) NEGATIVE mg/dL   Hgb urine dipstick LARGE (A) NEGATIVE   Bilirubin Urine NEGATIVE NEGATIVE   Ketones, ur NEGATIVE NEGATIVE mg/dL   Protein, ur NEGATIVE NEGATIVE mg/dL   Nitrite NEGATIVE NEGATIVE   Leukocytes,Ua NEGATIVE NEGATIVE   RBC / HPF 21-50 0 - 5 RBC/hpf   WBC, UA 0-5 0 - 5 WBC/hpf   Bacteria, UA NONE SEEN NONE SEEN   Squamous Epithelial / LPF NONE SEEN 0 - 5   Mucus PRESENT     Comment: Performed at [facility], [address] [place], [place] [address]   No results found.  Pending Labs Unresulted Labs (From admission, onward)    Start     Ordered   06/11/19 0500  Comprehensive metabolic panel  Tomorrow morning,   STAT     06/10/19 2330   06/10/19 2320  Hemoglobin A1c  Once,   STAT    Comments: To assess prior glycemic control    06/10/19 2330   06/10/19 2306  SARS CORONAVIRUS 2 (TAT 6-24 HRS) Nasopharyngeal Nasopharyngeal Swab  (Asymptomatic/Tier 2 Patients Labs)  Once,   STAT    Question Answer Comment  Is this test for diagnosis or screening Screening   Symptomatic for COVID-19 as defined by CDC No   Hospitalized for COVID-19 No   Admitted to ICU for COVID-19 No   Previously tested for COVID-19 Yes   Resident in a congregate (group) care setting No   Employed in healthcare setting No      06/10/19 2305          Vitals/Pain Today's Vitals   06/10/19 2017 06/10/19 2300  BP: 137/87 124/74  Pulse: 85 68  Resp: 18 16  Temp: 99.6 F (37.6 C)   TempSrc: Oral   SpO2: 98% 98%    Isolation Precautions No active isolations  Medications Medications  enoxaparin (LOVENOX) injection 40 mg (has no administration in time range)  0.9 %  sodium chloride infusion (has no administration in time  range)  acetaminophen (TYLENOL) tablet 650 mg (has no administration in time range)    Or  acetaminophen (TYLENOL) suppository 650 mg (has no administration in time range)  traZODone (DESYREL) tablet 25 mg (has no administration in time range)  magnesium hydroxide (MILK OF MAGNESIA) suspension 30 mL (has no administration in time range)  ondansetron (ZOFRAN) tablet 4 mg (has no administration in time range)    Or  ondansetron (ZOFRAN) injection 4 mg (has no administration in time range)  insulin aspart (novoLOG) injection 0-20 Units (has no administration in time range)  sodium chloride 0.9 % bolus 1,000 mL (0 mLs Intravenous Stopped 06/10/19 2310)  lactated ringers bolus 1,000 mL (1,000 mLs Intravenous New Bag/Given 06/10/19 2316)  insulin aspart (novoLOG) injection 10 Units (10 Units Subcutaneous Given 06/10/19 2309)    Mobility walks Low fall risk   Focused Assessments n/a   R Recommendations: See Admitting Provider Note  Report given to:   Additional Notes: n/a

## 2019-06-11 NOTE — Progress Notes (Signed)
Inpatient Diabetes Program Recommendations  AACE/ADA: New Consensus Statement on Inpatient Glycemic Control   Target Ranges:  Prepandial:   less than 140 mg/dL      Peak postprandial:   less than 180 mg/dL (1-2 hours)      Critically ill patients:  140 - 180 mg/dL   Results for AYCE, BOMBARA (MRN [id number]) as of 06/11/2019 15:25  Ref. Range 06/11/2019 00:10 06/11/2019 01:02 06/11/2019 04:12 06/11/2019 08:02 06/11/2019 12:16  Glucose-Capillary Latest Ref Range: 70 - 99 mg/dL 344 (H) 205 (H) 108 (H) 126 (H) 290 (H)  Results for DASAN, ALMONTE (MRN [id number]) as of 06/11/2019 15:25  Ref. Range 06/10/2019 20:21 06/10/2019 20:23 06/10/2019 21:48  Glucose-Capillary Latest Ref Range: 70 - 99 mg/dL 523 (HH) 513 (HH) 450 (H)  Results for SRIVATSA, MASHEK (MRN [id number]) as of 06/11/2019 15:25  Ref. Range 11/27/2018 04:43 06/11/2019 04:29  Hemoglobin A1C Latest Ref Range: 4.8 - 5.6 % 16.9 (H) 13.7 (H)   Review of Glycemic Control  Diabetes history: DM2 Outpatient Diabetes medications: Lantus 50 units QHS, Humalog 50 units QAM, Humalog TID based on correction scale Current orders for Inpatient glycemic control: Lantus 25 units QHS, Novolog 0-20 units Q4H  Inpatient Diabetes Program Recommendations:   Insulin-Meal Coverage: Please consider ordering Novolog 5 units TID with meals for meal coverage if patient eats at least 50% of meals.  A1C: Current A1C 13.7% on 06/11/19 indicating an average glucose of 346 mg/dl over the past 2-3 months.  NOTE: Spoke with patient about diabetes and home regimen for diabetes control. Patient reports that he has active Medicaid but he does not have a PCP.  Inquired about following up with Dr. Ouida Sills (per hospital discharge on 11/28/18, patient was suppose to establish care with Dr. Ouida Sills). Patient states that he was told to call and make an appointment with Dr. Ouida Sills and he has called several times and was put on a waiting list. Therefore, he still does not  have a PCP to provide refills for medications.  Patient states that he has has very little Lantus and Novolog left (enough for a couple of days) and that he is also nearly out of all outpatient medications. Patient states he has plenty testing supplies at home.  Patient reports that he is actually taking Lantus 50 units QHS and Humalog 50 units QAM and Humalog per correction scale TID with meals (based on glucose). Patient states that when he was taking the insulin and checking his glucose, his glucose was always over 300 mg/dl.   Discussed A1C results (13.7% on 06/11/19) and explained that his current A1C indicates an average glucose of 346 mg/dl over the past 2-3 months. Discussed glucose and A1C goals. Discussed importance of checking CBGs and maintaining good CBG control to prevent long-term and short-term complications. Explained how hyperglycemia leads to damage within blood vessels which lead to the common complications seen with uncontrolled diabetes. Stressed to the patient the importance of improving glycemic control to prevent further complications from uncontrolled diabetes. Discussed complications in detail related to uncontrolled DM. Patient reports that he has neuropathy which causes him a great deal of pain.  Explained how hyperglycemia can damage nerves and cause neuropathy.  Discussed impact of nutrition, exercise, stress, sickness, and medications on diabetes control.Informed patient that I would ask case manager to assist with arranging follow up to establish care with a PCP.  Explained that he will need to be sure to keep appointment when one is made and he will need to follow  up consistently in order to get his medications filled and to keep DM controlled. Will need Rx for insulin pens and insulin pen needles at time of discharge (patient reports he will need refills on all outpatient medications since he is nearly out of everything). Reiterated importance of getting DM controlled to prevent  further complications from uncontrolled DM. Patient asked if he could get help with getting transportation home. Informed patient I would ask CM about helping with transportation.   Patient verbalized understanding of information discussed and he states that he has no further questions at this time related to diabetes.  At time of discharge: please provide Rx for: Lantus SoloStar pens(#[id number]), Novolog FlexPens [phone]), and insulin pen needles [phone]).   Thanks, Barnie Alderman, RN, MSN, CDE Diabetes Coordinator Inpatient Diabetes Program [phone] (Team Pager from 8am to 5pm)

## 2019-06-12 LAB — GLUCOSE, CAPILLARY
Glucose-Capillary: 284 mg/dL — ABNORMAL HIGH (ref 70–99)
Glucose-Capillary: 60 mg/dL — ABNORMAL LOW (ref 70–99)
Glucose-Capillary: 131 mg/dL — ABNORMAL HIGH (ref 70–99)
Glucose-Capillary: 127 mg/dL — ABNORMAL HIGH (ref 70–99)

## 2019-06-12 MED ORDER — ROSUVASTATIN CALCIUM 20 MG PO TABS: 20.0000 mg | ORAL_TABLET | Freq: Every day | ORAL | 0 refills | Status: AC

## 2019-06-12 MED ORDER — INSULIN ASPART 100 UNIT/ML ~~LOC~~ SOLN: 0.0000 [IU] | Freq: Every day | SUBCUTANEOUS | Status: DC

## 2019-06-12 MED ORDER — INSULIN ASPART 100 UNIT/ML ~~LOC~~ SOLN
0.0000 [IU] | Freq: Three times a day (TID) | SUBCUTANEOUS | Status: DC
  Administered 2019-06-12: 2 [IU] via SUBCUTANEOUS
  Filled 2019-06-12: qty 1

## 2019-06-12 MED ORDER — LANTUS SOLOSTAR 100 UNIT/ML ~~LOC~~ SOPN: 25.0000 [IU] | PEN_INJECTOR | Freq: Every day | SUBCUTANEOUS | 0 refills | Status: DC

## 2019-06-12 MED ORDER — PREGABALIN 100 MG PO CAPS: 100.0000 mg | ORAL_CAPSULE | Freq: Two times a day (BID) | ORAL | 0 refills | Status: AC

## 2019-06-12 MED ORDER — INSULIN PEN NEEDLE 32G X 4 MM MISC: 1.0000 [IU] | Freq: Four times a day (QID) | 0 refills | Status: DC

## 2019-06-12 MED ORDER — INSULIN ASPART 100 UNIT/ML ~~LOC~~ SOLN
4.0000 [IU] | Freq: Three times a day (TID) | SUBCUTANEOUS | Status: DC
  Administered 2019-06-12: 4 [IU] via SUBCUTANEOUS
  Filled 2019-06-12: qty 1

## 2019-06-12 MED ORDER — NOVOLOG FLEXPEN 100 UNIT/ML ~~LOC~~ SOPN: 0.0000 [IU] | PEN_INJECTOR | Freq: Three times a day (TID) | SUBCUTANEOUS | 0 refills | Status: DC

## 2019-06-12 MED ORDER — OXYCODONE-ACETAMINOPHEN 5-325 MG PO TABS
1.0000 | ORAL_TABLET | Freq: Once | ORAL | Status: AC
  Administered 2019-06-12: 1 via ORAL
  Filled 2019-06-12: qty 1

## 2019-06-12 NOTE — Progress Notes (Signed)
[facility] NAME: Christopher [name]    MR#:  [id number]  DATE OF BIRTH:  Mar 31, 1966  SUBJECTIVE:  CHIEF COMPLAINT:   Chief Complaint  Patient presents with  . Hyperglycemia    REVIEW OF SYSTEMS:    ROS  DRUG ALLERGIES:   Allergies  Allergen Reactions  . Tramadol Nausea And Vomiting    VITALS:  Blood pressure (!) 123/92, pulse (!) 48, temperature 98.2 F (36.8 C), resp. rate 18, height 5\' 9"  (1.753 m), weight 84.7 kg, SpO2 100 %.  PHYSICAL EXAMINATION:   Physical Exam  GENERAL:  53 y.o.-year-old patient lying in the bed with no acute distress.  EYES: Pupils equal, round, reactive to light and accommodation. No scleral icterus. Extraocular muscles intact.  HEENT: Head atraumatic, normocephalic. Oropharynx and nasopharynx clear.  NECK:  Supple, no jugular venous distention. No thyroid enlargement, no tenderness.  LUNGS: Normal breath sounds bilaterally, no wheezing, rales, rhonchi. No use of accessory muscles of respiration.  CARDIOVASCULAR: S1, S2 normal. No murmurs, rubs, or gallops.  ABDOMEN: Soft, nontender, nondistended. Bowel sounds present. No organomegaly or mass.  EXTREMITIES: No cyanosis, clubbing or edema b/l.    NEUROLOGIC: Cranial nerves II through XII are intact. No focal Motor or sensory deficits b/l.   PSYCHIATRIC: The patient is alert and oriented x 3.  SKIN: No obvious rash, lesion, or ulcer.   LABORATORY PANEL:   CBC Recent Labs  Lab 06/10/19 2021  WBC 8.2  HGB 14.3  HCT 42.9  PLT 326   ------------------------------------------------------------------------------------------------------------------ Chemistries  Recent Labs  Lab 06/10/19 2025 06/11/19 0429  NA  --  139  K  --  3.3*  CL  --  107  CO2  --  24  GLUCOSE  --  85  BUN  --  14  CREATININE  --  0.80  CALCIUM  --  8.9  MG 2.5*  --   AST  --  20  ALT  --  17  ALKPHOS  --  51  BILITOT  --  0.5    ------------------------------------------------------------------------------------------------------------------  Cardiac Enzymes No results for input(s): TROPONINI in the last 168 hours. ------------------------------------------------------------------------------------------------------------------  RADIOLOGY:  No results found.   ASSESSMENT AND PLAN:   1. Uncontrolled type 2 diabetes mellitus  with hyperglycemia Missed Insulin at home Non compliant Needs prescriptions at discharge Improving Started on lantus and SSI Monitor overnight  2.  Cramps in both feet and hands.  This could be related to dehydration as well as diabetic neuropathy.  Start lyrica.  3. Hypertension. Continue amlodipine and coreg.  4.  Dyslipidemia. Continue statin therapy.  5. Peripheral neuropathy. Continue Lyrica.  6. DVT Prophylaxis. SQ Lovenox.  All the records are reviewed and case discussed with Care Management/Social Worker Management plans discussed with the patient, family and they are in agreement.  CODE STATUS: FULL CODE  TOTAL TIME TAKING CARE OF THIS PATIENT: 35 minutes.   POSSIBLE D/C IN 1-2 DAYS, DEPENDING ON CLINICAL CONDITION.  Christopher [name] M.D on 06/12/2019 at 1:25 PM  Between 7am to 6pm - Pager - [phone]  After 6pm go to [email or web address] - password [email or web address] [facility]  Office  [phone]  CC: Primary care physician; Patient, No Pcp Per  Note: This dictation was prepared with Dragon dictation along with smaller phrase technology. Any transcriptional errors that result from this process are unintentional.

## 2019-06-12 NOTE — Progress Notes (Signed)
Went over discharge instructions with the patient including medications and follow-up appointment. Discontinue PIV and telemetry monitor. Gave patient's taxi voucher.

## 2019-06-12 NOTE — Progress Notes (Signed)
Inpatient Diabetes Program Recommendations  AACE/ADA: New Consensus Statement on Inpatient Glycemic Control (2015)  Target Ranges:  Prepandial:   less than 140 mg/dL      Peak postprandial:   less than 180 mg/dL (1-2 hours)      Critically ill patients:  140 - 180 mg/dL   Results for Christopher [name], Christopher [name] (MRN [id number]) as of 06/12/2019 08:26  Ref. Range 06/11/2019 00:10 06/11/2019 01:02 06/11/2019 04:12 06/11/2019 08:02 06/11/2019 12:16 06/11/2019 17:10 06/11/2019 20:49 06/11/2019 22:34  Glucose-Capillary Latest Ref Range: 70 - 99 mg/dL 344 (H)  10 units NOVOLOG  205 (H)  7 units NOVOLOG +  25 units LANTUS  108 (H) 126 (H)  3 units NOVOLOG  290 (H)  11 units NOVOLOG  152 (H)  9 units NOVOLOG  67 (L) 184 (H)    25 units LANTUS   Results for Christopher [name], Christopher [name] (MRN [id number]) as of 06/12/2019 08:26  Ref. Range 06/12/2019 01:36 06/12/2019 04:53 06/12/2019 07:56  Glucose-Capillary Latest Ref Range: 70 - 99 mg/dL 284 (H)  11 units NOVOLOG  60 (L) 131 (H)   Results for Christopher [name], Christopher [name] (MRN [id number]) as of 06/12/2019 08:26  Ref. Range 11/27/2018 04:43 06/11/2019 04:29  Hemoglobin A1C Latest Ref Range: 4.8 - 5.6 % 16.9 (H) 13.7 (H)     Home DM Meds: Lantus 50 units QHS       Humalog 50 units QAM       Humalog TID per SSI   Current Orders: Lantus 25 units QHS      Novolog Resistant Correction Scale/ SSI (0-20 units) Q4 hours      Novolog 5 units TID with meals    MD- Note patient with Hypoglycemic events yesterday at 9pm and again this morning at 5am.  Suspect the Novolog Insulin may have caused the HYPO events.  Please consider the following:  1. Change/Reduce the Novolog SSI to Moderate scale (0-15 units) TID AC + HS  2. Reduce Novolog Meal Coverage to: Novolog 4 units TID with meals  3. Continue current Lantus dose  4. Will need Rx for insulin pens and insulin pen needles at time of discharge (patient reports he will need refills on all outpatient medications    At time of discharge please provide Rx for:   1. Lantus SoloStar pens(#[id number]) 2. Novolog FlexPens(#[id number]) 3. Insulin pen needles(#[phone]).   Diabetes Coordinator counseled patient yesterday.  Pt told Diabetes Coordinator that he was never able to make an appt with Dr. Ouida Sills at [facility] to establish care.  Patient states that he was told to call and make an appointment with Dr. Ouida Sills and he has called several times and was put on a waiting list. Therefore, he still does not have a PCP to provide refills for medications. Patient states that he has has very little Lantus and Novolog left (enough for a couple of days) and that he is also nearly out of all outpatient medications.      --Will follow patient during hospitalization--  Wyn Quaker RN, MSN, CDE Diabetes Coordinator Inpatient Glycemic Control Team Team Pager: [phone] (8a-5p)

## 2019-06-12 NOTE — TOC Transition Note (Addendum)
Transition of Care [facility]) - CM/SW Discharge Note   Patient Details  Name: Christopher [name] MRN: [id number] Date of Birth: 06-08-1966  [facility] of [facility]) CM/SW Contact:  Latanya Maudlin, RN Phone Number: 06/12/2019, 11:44 AM   Clinical Narrative:   Patient to be discharged. TOC consult for medications assistance and PCP appointment. Patient was being set up with [facility] for PCP however patient did not show up to appointment and they no longer accept medicaid patients. Appointment scheduled with [facility]. They will also provided low cost medications beginning on Nov 10th. Until then patient will use Product/process development scientist. I have updated Medicaid worker [name] at patients request. I have also provided him with her number for any further outpatient needs. Patient does not have transport home. Only source of transportation is in [place] and she is having issues with her child. Cab voucher used.     Final next level of care: Home/Self Care Barriers to Discharge: No Barriers Identified   Patient Goals and CMS Choice Patient states their goals for this hospitalization and ongoing recovery are:: To return back home CMS Medicare.gov Compare Post Acute Care list provided to:: Patient    Discharge Placement                       Discharge Plan and Services                DME Arranged: N/A DME Agency: NA                  Social Determinants of Health (SDOH) Interventions     Readmission Risk Interventions Readmission Risk Prevention Plan 11/26/2018  Post Dischage Appt Complete  Medication Screening Complete  Transportation Screening Complete  Some recent data might be hidden

## 2019-06-16 NOTE — Discharge Summary (Signed)
[name]    MR#:  [id number]  DATE OF BIRTH:  [birth year]  DATE OF ADMISSION:  06/10/2019 ADMITTING PHYSICIAN: Christel Mormon, MD  DATE OF DISCHARGE: 06/12/2019  5:10 PM  PRIMARY CARE PHYSICIAN: Patient, No Pcp Per   ADMISSION DIAGNOSIS:  Hyperglycemia [R73.9]  DISCHARGE DIAGNOSIS:  Active Problems:   Uncontrolled type 2 diabetes mellitus ([place])   SECONDARY DIAGNOSIS:   Past Medical History:  Diagnosis Date  . Anxiety   . Arthritis   . Cancer [facility])    Prostate  . Diabetes mellitus without complication ([place])   . Hyperlipidemia   . Hypertension   . Neuropathy   . Neuropathy   . Sleep apnea      ADMITTING HISTORY  HISTORY OF PRESENT ILLNESS:  Christopher [name] is a 53 y.o. AA male with a known history of HTN, type 2 DM, dyslipidemia and peripheral neuropathy, who presented to the ER with acute onset of cramps in his feet and hands. He has been having polyuria and polydipsia lately and blood sugars were reading high. He didn't miss his insulin but has two days left. He has no pcp currently. No fever or chills, nausea or vomiting or abdominal pain or diarrhea. No chest pain or cough or dyspnea. No dysuria or hematuria or flank pain.   When he caMe to the ER, vital signs were within normal. LABS revealed blood glucose of 580 with NA 130, Chloride 94. CBC within normal. UA with glucosuria and 21-50 rbcs.   The patient was given 2 liter bolus of IV normal saline and 10 units SQ Novolog. He will be admitted to an observation medically monitored bed for further evaluation and management.    HOSPITAL COURSE:   1. Uncontrolled type 2 diabetes mellitus with hyperglycemia Missed Insulin at home Non compliant Needs prescriptions at discharge and given Improving Started on lantus and SSI Patient monitored overnight and blood sugar stable prior to discharge. Counseled extensively to be compliant and  follow-up with primary care physician.  2. Cramps in both feet and hands. This could be related to dehydration as well as diabetic neuropathy. Started lyrica.  3. Hypertension. Continue amlodipine and coreg.  4. Dyslipidemia. Continue statin therapy.  5. Peripheral neuropathy. Continue Lyrica.  Patient stable for discharge home  CONSULTS OBTAINED:    DRUG ALLERGIES:   Allergies  Allergen Reactions  . Tramadol Nausea And Vomiting    DISCHARGE MEDICATIONS:   Allergies as of 06/12/2019      Reactions   Tramadol Nausea And Vomiting      Medication List    STOP taking these medications   carvedilol 6.25 MG tablet Commonly known as: COREG   spironolactone 25 MG tablet Commonly known as: ALDACTONE     TAKE these medications   acetaminophen 325 MG tablet Commonly known as: TYLENOL Take 2 tablets (650 mg total) by mouth every 6 (six) hours as needed for fever.   Insulin Pen Needle 32G X 4 MM Misc 1 Units by Does not apply route 4 (four) times daily. What changed:   how much to take  when to take this   Lantus SoloStar 100 UNIT/ML Solostar Pen Generic drug: Insulin Glargine Inject 25 Units into the skin daily. What changed: how much to take   NovoLOG FlexPen 100 UNIT/ML FlexPen Generic drug: insulin aspart Inject 0-10 Units into the skin 3 (three) times daily with meals. What changed: how much to take  pregabalin 100 MG capsule Commonly known as: LYRICA Take 1 capsule (100 mg total) by mouth 2 (two) times daily.   rosuvastatin 20 MG tablet Commonly known as: CRESTOR Take 1 tablet (20 mg total) by mouth daily.       Today   VITAL SIGNS:  Blood pressure (!) 123/92, pulse (!) 48, temperature 98.2 F (36.8 C), resp. rate 18, height 5\' 9"  (1.753 m), weight 84.7 kg, SpO2 100 %.  I/O:  No intake or output data in the 24 hours ending 06/16/19 1905  PHYSICAL EXAMINATION:  Physical Exam  GENERAL:  53 y.o.-year-old patient lying in the bed with  no acute distress.  LUNGS: Normal breath sounds bilaterally, no wheezing, rales,rhonchi or crepitation. No use of accessory muscles of respiration.  CARDIOVASCULAR: S1, S2 normal. No murmurs, rubs, or gallops.  ABDOMEN: Soft, non-tender, non-distended. Bowel sounds present. No organomegaly or mass.  NEUROLOGIC: Moves all 4 extremities. PSYCHIATRIC: The patient is alert and oriented x 3.  SKIN: No obvious rash, lesion, or ulcer.   DATA REVIEW:   CBC Recent Labs  Lab 06/10/19 2021  WBC 8.2  HGB 14.3  HCT 42.9  PLT 326    Chemistries  Recent Labs  Lab 06/10/19 2025 06/11/19 0429  NA  --  139  K  --  3.3*  CL  --  107  CO2  --  24  GLUCOSE  --  85  BUN  --  14  CREATININE  --  0.80  CALCIUM  --  8.9  MG 2.5*  --   AST  --  20  ALT  --  17  ALKPHOS  --  51  BILITOT  --  0.5    Cardiac Enzymes No results for input(s): TROPONINI in the last 168 hours.  Microbiology Results  Results for orders placed or performed during the hospital encounter of 06/10/19  SARS CORONAVIRUS 2 (TAT 6-24 HRS) Nasopharyngeal Nasopharyngeal Swab     Status: None   Collection Time: 06/10/19 11:21 PM   Specimen: Nasopharyngeal Swab  Result Value Ref Range Status   SARS Coronavirus 2 NEGATIVE NEGATIVE Final    Comment: (NOTE) SARS-CoV-2 target nucleic acids are NOT DETECTED. The SARS-CoV-2 RNA is generally detectable in upper and lower respiratory specimens during the acute phase of infection. Negative results do not preclude SARS-CoV-2 infection, do not rule out co-infections with other pathogens, and should not be used as the sole basis for treatment or other patient management decisions. Negative results must be combined with clinical observations, patient history, and epidemiological information. The expected result is Negative. Fact Sheet for Patients: SugarRoll.be Fact Sheet for Healthcare Providers: https://www.woods-mathews.com/ This test  is not yet approved or cleared by the [place] FDA and  has been authorized for detection and/or diagnosis of SARS-CoV-2 by FDA under an Emergency Use Authorization (EUA). This EUA will remain  in effect (meaning this test can be used) for the duration of the COVID-19 declaration under Section 56 4(b)(1) of the Act, 21 U.S.C. section 360bbb-3(b)(1), unless the authorization is terminated or revoked sooner. Performed at [facility], [place] [address], [place], [place] [address]     RADIOLOGY:  No results found.  Follow up with PCP in 1 week.  Management plans discussed with the patient, family and they are in agreement.  CODE STATUS:  Code Status History    Date Active Date Inactive Code Status Order ID Comments User Context   06/10/2019 2330 06/12/2019 2051 Full Code [id number]  Mansy, Arvella Merles, MD ED   11/25/2018 2200 11/28/2018 1853 Full Code [id number]  Nicholes Mango, MD ED   02/01/2017 0630 02/02/2017 1639 Full Code [id number]  Harrie Foreman, MD Inpatient   07/04/2015 2354 07/06/2015 2235 Full Code [id number]  Hillary Bow, MD ED   Advance Care Planning Activity      TOTAL TIME TAKING CARE OF THIS PATIENT ON DAY OF DISCHARGE: more than 30 minutes.   Leia Alf Maimuna Leaman M.D on 06/16/2019 at 7:05 PM  Between 7am to 6pm - Pager - [phone]  After 6pm go to [email or web address] - password [email or web address] [facility]  Office  [phone]  CC: Primary care physician; Patient, No Pcp Per  Note: This dictation was prepared with Dragon dictation along with smaller phrase technology. Any transcriptional errors that result from this process are unintentional.

## 2020-04-06 ENCOUNTER — Encounter: Payer: Self-pay | Admitting: Emergency Medicine

## 2020-04-06 ENCOUNTER — Emergency Department
Admission: EM | Admit: 2020-04-06 | Discharge: 2020-04-06 | Disposition: A | Discharge status: Home / Self Care | Payer: Medicaid Other | Attending: Emergency Medicine | Admitting: Emergency Medicine

## 2020-04-06 ENCOUNTER — Other Ambulatory Visit: Payer: Self-pay

## 2020-04-06 DIAGNOSIS — E1110 Type 2 diabetes mellitus with ketoacidosis without coma: Secondary | ICD-10-CM | POA: Diagnosis not present

## 2020-04-06 DIAGNOSIS — E785 Hyperlipidemia, unspecified: Secondary | ICD-10-CM | POA: Diagnosis not present

## 2020-04-06 DIAGNOSIS — I10 Essential (primary) hypertension: Secondary | ICD-10-CM | POA: Insufficient documentation

## 2020-04-06 DIAGNOSIS — E1140 Type 2 diabetes mellitus with diabetic neuropathy, unspecified: Secondary | ICD-10-CM | POA: Diagnosis not present

## 2020-04-06 DIAGNOSIS — M79605 Pain in left leg: Secondary | ICD-10-CM | POA: Insufficient documentation

## 2020-04-06 DIAGNOSIS — E1165 Type 2 diabetes mellitus with hyperglycemia: Secondary | ICD-10-CM | POA: Insufficient documentation

## 2020-04-06 DIAGNOSIS — Z794 Long term (current) use of insulin: Secondary | ICD-10-CM | POA: Diagnosis not present

## 2020-04-06 DIAGNOSIS — C61 Malignant neoplasm of prostate: Secondary | ICD-10-CM | POA: Diagnosis not present

## 2020-04-06 DIAGNOSIS — R739 Hyperglycemia, unspecified: Secondary | ICD-10-CM

## 2020-04-06 LAB — CBC
HCT: 43.3 % (ref 39.0–52.0)
Hemoglobin: 14.4 g/dL (ref 13.0–17.0)
MCH: 27.4 pg (ref 26.0–34.0)
MCHC: 33.3 g/dL (ref 30.0–36.0)
MCV: 82.3 fL (ref 80.0–100.0)
Platelets: 297 10*3/uL (ref 150–400)
RBC: 5.26 MIL/uL (ref 4.22–5.81)
RDW: 13.3 % (ref 11.5–15.5)
WBC: 6.9 10*3/uL (ref 4.0–10.5)
nRBC: 0 % (ref 0.0–0.2)

## 2020-04-06 LAB — COMPREHENSIVE METABOLIC PANEL
ALT: 22 U/L (ref 0–44)
AST: 29 U/L (ref 15–41)
Albumin: 4.4 g/dL (ref 3.5–5.0)
Alkaline Phosphatase: 51 U/L (ref 38–126)
Anion gap: 15 (ref 5–15)
BUN: 10 mg/dL (ref 6–20)
CO2: 24 mmol/L (ref 22–32)
Calcium: 10.2 mg/dL (ref 8.9–10.3)
Chloride: 96 mmol/L — ABNORMAL LOW (ref 98–111)
Creatinine, Ser: 1.02 mg/dL (ref 0.61–1.24)
GFR calc Af Amer: >60 mL/min (ref >60)
GFR calc non Af Amer: >60 mL/min (ref >60)
Glucose, Bld: 393 mg/dL — ABNORMAL HIGH (ref 70–99)
Potassium: 4 mmol/L (ref 3.5–5.1)
Sodium: 135 mmol/L (ref 135–145)
Total Bilirubin: 0.9 mg/dL (ref 0.3–1.2)
Total Protein: 7.7 g/dL (ref 6.5–8.1)

## 2020-04-06 MED ORDER — KETOROLAC TROMETHAMINE 30 MG/ML IJ SOLN
30.0000 mg | Freq: Once | INTRAMUSCULAR | Status: AC
  Administered 2020-04-06: 30 mg via INTRAMUSCULAR
  Filled 2020-04-06: qty 1

## 2020-04-06 MED ORDER — ACETAMINOPHEN 500 MG PO TABS
1000.0000 mg | ORAL_TABLET | Freq: Once | ORAL | Status: AC
  Administered 2020-04-06: 1000 mg via ORAL
  Filled 2020-04-06: qty 2

## 2020-04-06 NOTE — Discharge Instructions (Addendum)
Please take Tylenol and ibuprofen/Advil for your pain.  It is safe to take them together, or to alternate them every few hours.  Take up to 1000mg  of Tylenol at a time, up to 4 times per day.  Do not take more than 4000 mg of Tylenol in 24 hours.  For ibuprofen, take 400-600 mg, 4-5 times per day.  Return to the ED with any worsening symptoms or fever/swelling related to your symptoms.

## 2020-04-06 NOTE — ED Triage Notes (Signed)
Pt reports cramping in both legs from ankle to shin for 2 hours. Pt denies injuries. No swelling noted or redness noted.

## 2020-04-06 NOTE — ED Notes (Signed)
Pt ambulates safely.

## 2020-04-06 NOTE — ED Notes (Signed)
Pt alert and oriented X 4, stable for discharge. RR even and unlabored, color WNL. Discussed discharge instructions and follow up when appropriate. Instructed to follow up with ER for any life threatening symptoms or concerns that patient or family of patient may have  

## 2020-04-06 NOTE — ED Provider Notes (Signed)
[facility] Emergency Department Provider Note ____________________________________________   First MD Initiated Contact with Patient 04/06/20 [phone]     (approximate)  I have reviewed the triage vital signs and the nursing notes.  HISTORY  Chief Complaint Leg Pain   HPI Christopher [name] is a 54 y.o. malewho presents to the ED for evaluation of leg cramping  Chart review indicates history of DM, HTN, HLD and peripheral neuropathy.  Patient presents to the ED with about 2 hours of L > R leg cramping.  Patient reports his "calf is cramping up."  Patient reports 5/10 intensity to his left calf, 1/10 intensity to his right leg.  Denies any precipitating trauma or injuries recently.  Reports he has been ambulatory without complication.  Denies fevers, swelling, falls, difficulty urinating or stooling.  Reports tolerating p.o. intake at baseline.  Denies syncope, headache or fevers. Requesting food.  Denies suicidality.  Says he has a safe place to stay tonight.   Past Medical History:  Diagnosis Date  . Anxiety   . Arthritis   . Cancer [facility])    Prostate  . Diabetes mellitus without complication ([place])   . Hyperlipidemia   . Hypertension   . Neuropathy   . Neuropathy   . Sleep apnea     Patient Active Problem List   Diagnosis Date Noted  . Uncontrolled type 2 diabetes mellitus ([place]) 06/10/2019  . DKA (diabetic ketoacidoses) ([place]) 11/25/2018  . PNA (pneumonia) 11/25/2018  . TIA (transient ischemic attack) 02/01/2017  . Cramps, muscle, general 07/04/2015    Past Surgical History:  Procedure Laterality Date  . APPENDECTOMY    . PROSTATECTOMY      Prior to Admission medications   Medication Sig Start Date End Date Taking? Authorizing Provider  acetaminophen (TYLENOL) 325 MG tablet Take 2 tablets (650 mg total) by mouth every 6 (six) hours as needed for fever. 11/28/18   Gouru, Illene Silver, MD  insulin aspart (NOVOLOG FLEXPEN) 100 UNIT/ML FlexPen Inject 0-10  Units into the skin 3 (three) times daily with meals. 06/12/19   Hillary Bow, MD  Insulin Glargine (LANTUS SOLOSTAR) 100 UNIT/ML Solostar Pen Inject 25 Units into the skin daily. 06/12/19   Hillary Bow, MD  Insulin Pen Needle 32G X 4 MM MISC 1 Units by Does not apply route 4 (four) times daily. 06/12/19   Hillary Bow, MD  pregabalin (LYRICA) 100 MG capsule Take 1 capsule (100 mg total) by mouth 2 (two) times daily. 06/12/19   Hillary Bow, MD  rosuvastatin (CRESTOR) 20 MG tablet Take 1 tablet (20 mg total) by mouth daily. 06/12/19   Hillary Bow, MD    Allergies Tramadol  Family History  Problem Relation Age of Onset  . Cancer Father   . Stroke Father     Social History Social History   Tobacco Use  . Smoking status: Never Smoker  . Smokeless tobacco: Never Used  Vaping Use  . Vaping Use: Never used  Substance Use Topics  . Alcohol use: No  . Drug use: No    Review of Systems  Constitutional: No fever/chills Eyes: No visual changes. ENT: No sore throat. Cardiovascular: Denies chest pain. Respiratory: Denies shortness of breath. Gastrointestinal: No abdominal pain.  No nausea, no vomiting.  No diarrhea.  No constipation. Genitourinary: Negative for dysuria. Musculoskeletal: Negative for back pain.Marland Kitchen  Positive for leg cramping Skin: Negative for rash. Neurological: Negative for headaches, focal weakness or numbness.   ____________________________________________   PHYSICAL EXAM:  VITAL SIGNS:  Vitals:   04/06/20 0707 04/06/20 1036  BP: 121/71 106/81  Pulse: 62 65  Resp: 20 16  Temp: 98.9 F (37.2 C)   SpO2: 98% 98%      Constitutional: Alert and oriented. Well appearing and in no acute distress. Eyes: Conjunctivae are normal. PERRL. EOMI. Head: Atraumatic. Nose: No congestion/rhinnorhea. Mouth/Throat: Mucous membranes are moist.  Oropharynx non-erythematous. Neck: No stridor. No cervical spine tenderness to palpation. Cardiovascular: Normal  rate, regular rhythm. Grossly normal heart sounds.  Good peripheral circulation. Respiratory: Normal respiratory effort.  No retractions. Lungs CTAB. Gastrointestinal: Soft , nondistended, nontender to palpation. No abdominal bruits. No CVA tenderness. Musculoskeletal: No lower extremity tenderness nor edema.  No joint effusions. No signs of acute trauma. Soft calves bilaterally without overlying skin changes, signs of trauma.  No erythema, warmth, induration or fluctuance.  His legs appear normal externally bilaterally.  He vaguely refers to his left shin as the source of pain, but he is nontender to the area.  Full active and passive ROM to his bilateral ankles, knees and hips. Neurologic:  Normal speech and language. No gross focal neurologic deficits are appreciated. No gait instability noted. Cranial nerves II through XII intact 5/5 strength and sensation in all 4 extremities Ambulatory with a normal gait Skin:  Skin is warm, dry and intact. No rash noted. Psychiatric: Mood and affect are normal. Speech and behavior are normal.  ____________________________________________   LABS (all labs ordered are listed, but only abnormal results are displayed)  Labs Reviewed  COMPREHENSIVE METABOLIC PANEL - Abnormal; Notable for the following components:      Result Value   Chloride 96 (*)    Glucose, Bld 393 (*)    All other components within normal limits  CBC    ____________________________________________   PROCEDURES and INTERVENTIONS  Procedure(s) performed (including Critical Care):  Procedures  Medications  ketorolac (TORADOL) 30 MG/ML injection 30 mg (30 mg Intramuscular Given 04/06/20 1034)  acetaminophen (TYLENOL) tablet 1,000 mg (1,000 mg Oral Given 04/06/20 1034)    ____________________________________________   INITIAL IMPRESSION / ASSESSMENT AND PLAN / ED COURSE  54 year old diabetic male presenting with left leg pain without evidence of acute pathology, and  amenable to outpatient management.  Normal vital signs on room air.  Reassuring examination without evidence of acute pathology.  He has no swelling, erythema, warmth to suggest underlying infection or DVT.  He is fully functional with intact ROM, strength and sensation.  He has had no injuries or signs of trauma.  Blood work demonstrates hyperglycemia without acidosis, for which patient received p.o. water.  He has no signs or symptoms of DKA or acidosis.  We discussed his home insulin usage and compliance.  We discussed outpatient management and following up with a PCP within 3-5 days.  Return precautions for the ED were also discussed.  Patient medically stable for discharge home.  Clinical Course as of Apr 07 1127  Wed Apr 06, 2020  1019 Patient laying in a hallway bed.  I tend to arouse the patient with verbal stimulation and light shaking, and he rolls over in bed, puts the blanket over his eyes and says "I hear you."  He does not answer my questions and indicates that he would be like to be left alone.   [DS]  1050 Patient sitting up in bed and eating [place] sandwich.  He is now more agreeable to talking.   [DS]  1127 Patient reports resolution of symptoms after Toradol/Tylenol.  We discussed outpatient  management and return precautions.   [DS]    Clinical Course User Index [DS] Vladimir Crofts, MD     ____________________________________________   FINAL CLINICAL IMPRESSION(S) / ED DIAGNOSES  Final diagnoses:  Left leg pain  Hyperglycemia     ED Discharge Orders    None       Ivyanna Sibert Tamala Julian   Note:  This document was prepared using Dragon voice recognition software and may include unintentional dictation errors.   Vladimir Crofts, MD 04/06/20 1130

## 2020-04-06 NOTE — ED Notes (Signed)
Given lunch tray. Sitting up in bed eating at this time.

## 2020-06-20 ENCOUNTER — Encounter: Payer: Self-pay | Admitting: Intensive Care

## 2020-06-20 ENCOUNTER — Other Ambulatory Visit: Payer: Self-pay

## 2020-06-20 ENCOUNTER — Emergency Department
Admission: EM | Admit: 2020-06-20 | Discharge: 2020-06-20 | Disposition: A | Discharge status: Home / Self Care | Payer: Medicaid Other | Attending: Emergency Medicine | Admitting: Emergency Medicine

## 2020-06-20 DIAGNOSIS — M79661 Pain in right lower leg: Secondary | ICD-10-CM | POA: Insufficient documentation

## 2020-06-20 DIAGNOSIS — Z794 Long term (current) use of insulin: Secondary | ICD-10-CM | POA: Insufficient documentation

## 2020-06-20 DIAGNOSIS — Z8546 Personal history of malignant neoplasm of prostate: Secondary | ICD-10-CM | POA: Diagnosis not present

## 2020-06-20 DIAGNOSIS — R739 Hyperglycemia, unspecified: Secondary | ICD-10-CM

## 2020-06-20 DIAGNOSIS — E1110 Type 2 diabetes mellitus with ketoacidosis without coma: Secondary | ICD-10-CM | POA: Insufficient documentation

## 2020-06-20 DIAGNOSIS — M79605 Pain in left leg: Secondary | ICD-10-CM

## 2020-06-20 DIAGNOSIS — I10 Essential (primary) hypertension: Secondary | ICD-10-CM | POA: Insufficient documentation

## 2020-06-20 DIAGNOSIS — M79662 Pain in left lower leg: Secondary | ICD-10-CM | POA: Diagnosis not present

## 2020-06-20 DIAGNOSIS — E1165 Type 2 diabetes mellitus with hyperglycemia: Secondary | ICD-10-CM | POA: Diagnosis not present

## 2020-06-20 DIAGNOSIS — R252 Cramp and spasm: Secondary | ICD-10-CM | POA: Diagnosis present

## 2020-06-20 DIAGNOSIS — M79604 Pain in right leg: Secondary | ICD-10-CM

## 2020-06-20 LAB — BASIC METABOLIC PANEL
Anion gap: 9 (ref 5–15)
BUN: 22 mg/dL — ABNORMAL HIGH (ref 6–20)
CO2: 26 mmol/L (ref 22–32)
Calcium: 9.8 mg/dL (ref 8.9–10.3)
Chloride: 97 mmol/L — ABNORMAL LOW (ref 98–111)
Creatinine, Ser: 0.89 mg/dL (ref 0.61–1.24)
GFR, Estimated: >60 mL/min (ref >60)
Glucose, Bld: 420 mg/dL — ABNORMAL HIGH (ref 70–99)
Potassium: 4.2 mmol/L (ref 3.5–5.1)
Sodium: 132 mmol/L — ABNORMAL LOW (ref 135–145)

## 2020-06-20 LAB — CBC
HCT: 43.5 % (ref 39.0–52.0)
Hemoglobin: 14.5 g/dL (ref 13.0–17.0)
MCH: 27.1 pg (ref 26.0–34.0)
MCHC: 33.3 g/dL (ref 30.0–36.0)
MCV: 81.3 fL (ref 80.0–100.0)
Platelets: 348 10*3/uL (ref 150–400)
RBC: 5.35 MIL/uL (ref 4.22–5.81)
RDW: 12.4 % (ref 11.5–15.5)
WBC: 6.4 10*3/uL (ref 4.0–10.5)
nRBC: 0 % (ref 0.0–0.2)

## 2020-06-20 LAB — URINALYSIS, COMPLETE (UACMP) WITH MICROSCOPIC
Bacteria, UA: NONE SEEN
Bilirubin Urine: NEGATIVE
Glucose, UA: >=500 mg/dL — AB
Ketones, ur: NEGATIVE mg/dL
Leukocytes,Ua: NEGATIVE
Nitrite: NEGATIVE
Protein, ur: NEGATIVE mg/dL
Specific Gravity, Urine: 1.029 (ref 1.005–1.030)
pH: 7 (ref 5.0–8.0)

## 2020-06-20 LAB — CBG MONITORING, ED: Glucose-Capillary: 302 mg/dL — ABNORMAL HIGH (ref 70–99)

## 2020-06-20 MED ORDER — ORPHENADRINE CITRATE 30 MG/ML IJ SOLN
60.0000 mg | Freq: Once | INTRAMUSCULAR | Status: AC
  Administered 2020-06-20: 60 mg via INTRAVENOUS
  Filled 2020-06-20: qty 2

## 2020-06-20 MED ORDER — SODIUM CHLORIDE 0.9 % IV BOLUS
1000.0000 mL | Freq: Once | INTRAVENOUS | Status: AC
  Administered 2020-06-20: 1000 mL via INTRAVENOUS

## 2020-06-20 MED ORDER — KETOROLAC TROMETHAMINE 30 MG/ML IJ SOLN
30.0000 mg | Freq: Once | INTRAMUSCULAR | Status: AC
  Administered 2020-06-20: 30 mg via INTRAVENOUS
  Filled 2020-06-20: qty 1

## 2020-06-20 NOTE — ED Provider Notes (Signed)
[facility] Emergency Department Provider Note   ____________________________________________   First MD Initiated Contact with Patient 06/20/20 [phone]     (approximate)  I have reviewed the triage vital signs and the nursing notes.   HISTORY  Chief Complaint Leg Pain    HPI Christopher [name] is a 54 y.o. male patient complain of bilateral leg cramping that began today.  Patient has a history of diabetic neuropathy.  Patient arrived via EMS with blood sugar in the 400s.  Patient stated last night he spaghetti with sweet tea before going to bed.  Patient stays compliant with medication but states increased thirst today.  Patient seen 2-1/2 months ago for same complaint.      Past Medical History:  Diagnosis Date  . Anxiety   . Arthritis   . Cancer [facility])    Prostate  . Diabetes mellitus without complication ([place])   . Hyperlipidemia   . Hypertension   . Neuropathy   . Neuropathy   . Sleep apnea     Patient Active Problem List   Diagnosis Date Noted  . Uncontrolled type 2 diabetes mellitus ([place]) 06/10/2019  . DKA (diabetic ketoacidoses) 11/25/2018  . PNA (pneumonia) 11/25/2018  . TIA (transient ischemic attack) 02/01/2017  . Cramps, muscle, general 07/04/2015    Past Surgical History:  Procedure Laterality Date  . APPENDECTOMY    . PROSTATECTOMY      Prior to Admission medications   Medication Sig Start Date End Date Taking? Authorizing Provider  acetaminophen (TYLENOL) 325 MG tablet Take 2 tablets (650 mg total) by mouth every 6 (six) hours as needed for fever. 11/28/18   Gouru, Illene Silver, MD  insulin aspart (NOVOLOG FLEXPEN) 100 UNIT/ML FlexPen Inject 0-10 Units into the skin 3 (three) times daily with meals. 06/12/19   Hillary Bow, MD  Insulin Glargine (LANTUS SOLOSTAR) 100 UNIT/ML Solostar Pen Inject 25 Units into the skin daily. 06/12/19   Hillary Bow, MD  Insulin Pen Needle 32G X 4 MM MISC 1 Units by Does not apply route 4 (four) times  daily. 06/12/19   Hillary Bow, MD  pregabalin (LYRICA) 100 MG capsule Take 1 capsule (100 mg total) by mouth 2 (two) times daily. 06/12/19   Hillary Bow, MD  rosuvastatin (CRESTOR) 20 MG tablet Take 1 tablet (20 mg total) by mouth daily. 06/12/19   Hillary Bow, MD    Allergies Tramadol  Family History  Problem Relation Age of Onset  . Cancer Father   . Stroke Father     Social History Social History   Tobacco Use  . Smoking status: Never Smoker  . Smokeless tobacco: Never Used  Vaping Use  . Vaping Use: Never used  Substance Use Topics  . Alcohol use: No  . Drug use: No    Review of Systems  Constitutional: No fever/chills Eyes: No visual changes. ENT: No sore throat. Cardiovascular: Denies chest pain. Respiratory: Denies shortness of breath. Gastrointestinal: No abdominal pain.  No nausea, no vomiting.  No diarrhea.  No constipation. Genitourinary: Negative for dysuria. Musculoskeletal: Negative for back pain.  Bilateral leg cramping. Skin: Negative for rash. Neurological: Diabetic neuropathy. ____________________________________________   PHYSICAL EXAM:  VITAL SIGNS: ED Triage Vitals [06/20/20 0734]  Enc Vitals Group     BP (!) 158/78     Pulse Rate 92     Resp 16     Temp 98.6 F (37 C)     Temp Source Oral     SpO2 98 %  Weight 170 lb (77.1 kg)     Height 5\' 9"  (1.753 m)     Head Circumference      Peak Flow      Pain Score 10     Pain Loc      Pain Edu?      Excl. in [place]?     Constitutional: Alert and oriented. Well appearing and in no acute distress. Eyes: Conjunctivae are normal. PERRL. EOMI. Head: Atraumatic. Nose: No congestion/rhinnorhea. Mouth/Throat: Mucous membranes are moist.  Oropharynx non-erythematous. Neck: No stridor.  Hematological/Lymphatic/Immunilogical: No cervical lymphadenopathy. Cardiovascular: Normal rate, regular rhythm. Grossly normal heart sounds.  Good peripheral circulation. Respiratory: Normal  respiratory effort.  No retractions. Lungs CTAB. Gastrointestinal: Soft and nontender. No distention. No abdominal bruits. No CVA tenderness. Musculoskeletal: No obvious deformity to the lower extremities.  No obvious edema or erythema.  There is no effusions of the joints.  Patient has full and equal range of motion of the hip and lower leg. Neurologic:  Normal speech and language. No gross focal neurologic deficits are appreciated. No gait instability. Skin:  Skin is warm, dry and intact. No rash noted. Psychiatric: Mood and affect are normal. Speech and behavior are normal.  ____________________________________________   LABS (all labs ordered are listed, but only abnormal results are displayed)  Labs Reviewed  BASIC METABOLIC PANEL - Abnormal; Notable for the following components:      Result Value   Sodium 132 (*)    Chloride 97 (*)    Glucose, Bld 420 (*)    BUN 22 (*)    All other components within normal limits  URINALYSIS, COMPLETE (UACMP) WITH MICROSCOPIC - Abnormal; Notable for the following components:   Color, Urine YELLOW (*)    APPearance CLEAR (*)    Glucose, UA >=500 (*)    Hgb urine dipstick LARGE (*)    All other components within normal limits  CBG MONITORING, ED - Abnormal; Notable for the following components:   Glucose-Capillary 302 (*)    All other components within normal limits  CBC  CBG MONITORING, ED   ____________________________________________  EKG   ____________________________________________  RADIOLOGY I, Sable Feil, personally viewed and evaluated these images (plain radiographs) as part of my medical decision making, as well as reviewing the written report by the radiologist.  ED MD interpretation:    Official radiology report(s): No results found.  ____________________________________________   PROCEDURES  Procedure(s) performed (including Critical  Care):  Procedures   ____________________________________________   INITIAL IMPRESSION / ASSESSMENT AND PLAN / ED COURSE  As part of my medical decision making, I reviewed the following data within the [place]     Patient presents with complaint of bilateral leg cramping that began this morning.  Patient blood glucose in the 400s with patient admits to noncompliance of medication in the past 2 days and also a heavy carb meal consisting of spaghetti and a quarter of sweet tea.  Status post IV rehydration and administration of Toradol and Norflex patient reports much improvement.  Discussed outpatient management and patient advised to follow-up with PCP.          ____________________________________________   FINAL CLINICAL IMPRESSION(S) / ED DIAGNOSES  Final diagnoses:  Pain in both lower extremities  Hyperglycemia     ED Discharge Orders    None      *Please note:  Dave Mergen was evaluated in Emergency Department on 06/20/2020 for the symptoms described in the history of present illness.  He was evaluated in the context of the global COVID-19 pandemic, which necessitated consideration that the patient might be at risk for infection with the SARS-CoV-2 virus that causes COVID-19. Institutional protocols and algorithms that pertain to the evaluation of patients at risk for COVID-19 are in a state of rapid change based on information released by regulatory bodies including the CDC and federal and state organizations. These policies and algorithms were followed during the patient's care in the ED.  Some ED evaluations and interventions may be delayed as a result of limited staffing during and the pandemic.*   Note:  This document was prepared using Dragon voice recognition software and may include unintentional dictation errors.    Sable Feil, PA-C 06/20/20 1136    Vladimir Crofts, MD 06/20/20 [phone]

## 2020-06-20 NOTE — Progress Notes (Addendum)
Inpatient Diabetes Program Recommendations  AACE/ADA: New Consensus Statement on Inpatient Glycemic Control   Target Ranges:  Prepandial:   less than 140 mg/dL      Peak postprandial:   less than 180 mg/dL (1-2 hours)      Critically ill patients:  140 - 180 mg/dL   Results for ONEILL, BAIS (MRN [id number]) as of 06/20/2020 09:54  Ref. Range 06/20/2020 07:50  Glucose Latest Ref Range: 70 - 99 mg/dL 420 (H)  Results for SHANKAR, SILBER (MRN [id number]) as of 06/20/2020 09:54  Ref. Range 06/11/2019 04:29  Hemoglobin A1C Latest Ref Range: 4.8 - 5.6 % 13.7 (H)   Review of Glycemic Control  Diabetes history: DM2 Outpatient Diabetes medications: Lantus 25 units daily, Novolog 0-10 units TID with meals Current orders for Inpatient glycemic control: None; in Emergency Room  Inpatient Diabetes Program Recommendations:    Insulin: IF admitted, please consider ordering Lantus 15 units Q24H, CBGs AC&HS, and Novolog 0-15 units TID with meals and Novolog 0-5 units QHS. Also, please consider ordering an A1C.  Thanks, Barnie Alderman, RN, MSN, CDE Diabetes Coordinator Inpatient Diabetes Program [phone] (Team Pager from 8am to 5pm)

## 2020-06-20 NOTE — ED Triage Notes (Addendum)
Patient c/o bilateral leg cramping/pain that started about two hours ago. HX neuropathy and diabetes. EMS blood sugar 400s. Also reports eating spaghetti and sweet tea last night before bed. Patient very tired in triage. When asked he said he has increased thirst yesterday

## 2020-06-20 NOTE — Discharge Instructions (Signed)
Advised to continue previous medications and follow-up with PCP.

## 2020-06-20 NOTE — ED Notes (Signed)
Pt instructed not to get up and ambulate around due to being so tired

## 2020-06-20 NOTE — ED Notes (Signed)
Patient to ED waiting room via wheelchair by EMS.  EMS reports patient with complain of bilateral leg cramping below the knees that began this morning.  Reports patient also ate spaghetti and a sweet drink approximately an hour before their arrival and CBG 452.

## 2020-06-22 LAB — CBG MONITORING, ED: Glucose-Capillary: 420 mg/dL — ABNORMAL HIGH (ref 70–99)

## 2020-08-12 ENCOUNTER — Observation Stay
Admission: EM | Admit: 2020-08-12 | Discharge: 2020-08-13 | Disposition: A | Discharge status: Home / Self Care | Payer: Medicaid Other | Attending: Internal Medicine | Admitting: Internal Medicine

## 2020-08-12 ENCOUNTER — Other Ambulatory Visit: Payer: Self-pay

## 2020-08-12 ENCOUNTER — Emergency Department: Payer: Medicaid Other

## 2020-08-12 DIAGNOSIS — R55 Syncope and collapse: Secondary | ICD-10-CM | POA: Insufficient documentation

## 2020-08-12 DIAGNOSIS — R404 Transient alteration of awareness: Secondary | ICD-10-CM

## 2020-08-12 DIAGNOSIS — Z794 Long term (current) use of insulin: Secondary | ICD-10-CM | POA: Diagnosis not present

## 2020-08-12 DIAGNOSIS — R4182 Altered mental status, unspecified: Principal | ICD-10-CM | POA: Diagnosis present

## 2020-08-12 DIAGNOSIS — Z20822 Contact with and (suspected) exposure to covid-19: Secondary | ICD-10-CM | POA: Insufficient documentation

## 2020-08-12 DIAGNOSIS — E1142 Type 2 diabetes mellitus with diabetic polyneuropathy: Secondary | ICD-10-CM | POA: Diagnosis not present

## 2020-08-12 DIAGNOSIS — E119 Type 2 diabetes mellitus without complications: Secondary | ICD-10-CM | POA: Insufficient documentation

## 2020-08-12 DIAGNOSIS — I10 Essential (primary) hypertension: Secondary | ICD-10-CM | POA: Insufficient documentation

## 2020-08-12 DIAGNOSIS — Z8546 Personal history of malignant neoplasm of prostate: Secondary | ICD-10-CM | POA: Insufficient documentation

## 2020-08-12 DIAGNOSIS — F1490 Cocaine use, unspecified, uncomplicated: Secondary | ICD-10-CM

## 2020-08-12 DIAGNOSIS — Z79899 Other long term (current) drug therapy: Secondary | ICD-10-CM | POA: Diagnosis not present

## 2020-08-12 DIAGNOSIS — G9341 Metabolic encephalopathy: Secondary | ICD-10-CM

## 2020-08-12 DIAGNOSIS — R778 Other specified abnormalities of plasma proteins: Secondary | ICD-10-CM

## 2020-08-12 DIAGNOSIS — E785 Hyperlipidemia, unspecified: Secondary | ICD-10-CM

## 2020-08-12 LAB — URINALYSIS, COMPLETE (UACMP) WITH MICROSCOPIC
Bacteria, UA: NONE SEEN
Bilirubin Urine: NEGATIVE
Glucose, UA: >=500 mg/dL — AB
Ketones, ur: NEGATIVE mg/dL
Leukocytes,Ua: NEGATIVE
Nitrite: NEGATIVE
Protein, ur: NEGATIVE mg/dL
Specific Gravity, Urine: 1.027 (ref 1.005–1.030)
Squamous Epithelial / HPF: NONE SEEN (ref 0–5)
pH: 5 (ref 5.0–8.0)

## 2020-08-12 LAB — CBC
HCT: 41.2 % (ref 39.0–52.0)
Hemoglobin: 13.4 g/dL (ref 13.0–17.0)
MCH: 27.1 pg (ref 26.0–34.0)
MCHC: 32.5 g/dL (ref 30.0–36.0)
MCV: 83.4 fL (ref 80.0–100.0)
Platelets: 317 10*3/uL (ref 150–400)
RBC: 4.94 MIL/uL (ref 4.22–5.81)
RDW: 12.9 % (ref 11.5–15.5)
WBC: 9.1 10*3/uL (ref 4.0–10.5)
nRBC: 0 % (ref 0.0–0.2)

## 2020-08-12 LAB — URINE DRUG SCREEN, QUALITATIVE (ARMC ONLY)
Amphetamines, Ur Screen: NOT DETECTED
Barbiturates, Ur Screen: NOT DETECTED
Benzodiazepine, Ur Scrn: NOT DETECTED
Cannabinoid 50 Ng, Ur ~~LOC~~: NOT DETECTED
Cocaine Metabolite,Ur ~~LOC~~: POSITIVE — AB
MDMA (Ecstasy)Ur Screen: NOT DETECTED
Methadone Scn, Ur: NOT DETECTED
Opiate, Ur Screen: NOT DETECTED
Phencyclidine (PCP) Ur S: NOT DETECTED
Tricyclic, Ur Screen: NOT DETECTED

## 2020-08-12 LAB — COMPREHENSIVE METABOLIC PANEL
ALT: 26 U/L (ref 0–44)
AST: 19 U/L (ref 15–41)
Albumin: 4.2 g/dL (ref 3.5–5.0)
Alkaline Phosphatase: 59 U/L (ref 38–126)
Anion gap: 10 (ref 5–15)
BUN: 11 mg/dL (ref 6–20)
CO2: 26 mmol/L (ref 22–32)
Calcium: 9.5 mg/dL (ref 8.9–10.3)
Chloride: 94 mmol/L — ABNORMAL LOW (ref 98–111)
Creatinine, Ser: 1.01 mg/dL (ref 0.61–1.24)
GFR, Estimated: >60 mL/min (ref >60)
Glucose, Bld: 565 mg/dL (ref 70–99)
Potassium: 4.3 mmol/L (ref 3.5–5.1)
Sodium: 130 mmol/L — ABNORMAL LOW (ref 135–145)
Total Bilirubin: 0.8 mg/dL (ref 0.3–1.2)
Total Protein: 7.9 g/dL (ref 6.5–8.1)

## 2020-08-12 LAB — TROPONIN I (HIGH SENSITIVITY)
Troponin I (High Sensitivity): 19 ng/L — ABNORMAL HIGH (ref <18)
Troponin I (High Sensitivity): 21 ng/L — ABNORMAL HIGH (ref <18)
Troponin I (High Sensitivity): 29 ng/L — ABNORMAL HIGH (ref <18)

## 2020-08-12 LAB — GLUCOSE, CAPILLARY: Glucose-Capillary: 213 mg/dL — ABNORMAL HIGH (ref 70–99)

## 2020-08-12 LAB — RESP PANEL BY RT-PCR (FLU A&B, COVID) ARPGX2
Influenza A by PCR: NEGATIVE
Influenza B by PCR: NEGATIVE
SARS Coronavirus 2 by RT PCR: NEGATIVE

## 2020-08-12 LAB — CBG MONITORING, ED
Glucose-Capillary: 448 mg/dL — ABNORMAL HIGH (ref 70–99)
Glucose-Capillary: 160 mg/dL — ABNORMAL HIGH (ref 70–99)

## 2020-08-12 LAB — ETHANOL: Alcohol, Ethyl (B): <10 mg/dL (ref <10)

## 2020-08-12 MED ORDER — INSULIN GLARGINE 100 UNIT/ML ~~LOC~~ SOLN
10.0000 [IU] | Freq: Every day | SUBCUTANEOUS | Status: DC
  Administered 2020-08-12: 23:00:00 10 [IU] via SUBCUTANEOUS
  Filled 2020-08-12 (×2): qty 0.1

## 2020-08-12 MED ORDER — LACTATED RINGERS IV BOLUS
1000.0000 mL | Freq: Once | INTRAVENOUS | Status: AC
  Administered 2020-08-12: 16:00:00 1000 mL via INTRAVENOUS

## 2020-08-12 MED ORDER — METHOCARBAMOL 1000 MG/10ML IJ SOLN
500.0000 mg | Freq: Four times a day (QID) | INTRAVENOUS | Status: DC | PRN
  Filled 2020-08-12: qty 5

## 2020-08-12 MED ORDER — INSULIN ASPART 100 UNIT/ML ~~LOC~~ SOLN
0.0000 [IU] | Freq: Three times a day (TID) | SUBCUTANEOUS | Status: DC
  Administered 2020-08-13: 12:00:00 3 [IU] via SUBCUTANEOUS
  Administered 2020-08-13: 08:00:00 2 [IU] via SUBCUTANEOUS
  Filled 2020-08-12 (×3): qty 1

## 2020-08-12 MED ORDER — POLYETHYLENE GLYCOL 3350 17 G PO PACK: 17.0000 g | PACK | Freq: Every day | ORAL | Status: DC | PRN

## 2020-08-12 MED ORDER — INSULIN ASPART 100 UNIT/ML ~~LOC~~ SOLN
8.0000 [IU] | Freq: Once | SUBCUTANEOUS | Status: AC
  Administered 2020-08-12: 19:00:00 8 [IU] via INTRAVENOUS
  Filled 2020-08-12: qty 1

## 2020-08-12 MED ORDER — ONDANSETRON HCL 4 MG/2ML IJ SOLN: 4.0000 mg | Freq: Four times a day (QID) | INTRAMUSCULAR | Status: DC | PRN

## 2020-08-12 MED ORDER — ONDANSETRON HCL 4 MG PO TABS: 4.0000 mg | ORAL_TABLET | Freq: Four times a day (QID) | ORAL | Status: DC | PRN

## 2020-08-12 MED ORDER — ACETAMINOPHEN 325 MG PO TABS
650.0000 mg | ORAL_TABLET | Freq: Four times a day (QID) | ORAL | Status: DC | PRN
  Administered 2020-08-12: 650 mg via ORAL
  Filled 2020-08-12: qty 2

## 2020-08-12 MED ORDER — ACETAMINOPHEN 650 MG RE SUPP: 650.0000 mg | Freq: Four times a day (QID) | RECTAL | Status: DC | PRN

## 2020-08-12 MED ORDER — LACTATED RINGERS IV BOLUS
1000.0000 mL | Freq: Once | INTRAVENOUS | Status: AC
  Administered 2020-08-12: 17:00:00 1000 mL via INTRAVENOUS

## 2020-08-12 MED ORDER — TRAZODONE HCL 50 MG PO TABS: 50.0000 mg | ORAL_TABLET | Freq: Every evening | ORAL | Status: DC | PRN

## 2020-08-12 MED ORDER — METHOCARBAMOL 500 MG PO TABS
500.0000 mg | ORAL_TABLET | Freq: Four times a day (QID) | ORAL | Status: DC | PRN
  Administered 2020-08-13 (×2): 500 mg via ORAL
  Filled 2020-08-12 (×3): qty 1

## 2020-08-12 NOTE — ED Provider Notes (Signed)
[facility] Emergency Department Provider Note   ____________________________________________   Event Date/Time   First MD Initiated Contact with Patient 08/12/20 1500     (approximate)  I have reviewed the triage vital signs and the nursing notes.   HISTORY  Chief Complaint Altered Mental Status    HPI Christopher [name] is a 54 y.o. male with past medical history of hypertension, hyperlipidemia, diabetes, and prostate cancer who presents to the ED for altered mental status.  History is limited as patient is confused and does not remember what happened.  He states that he went to the store to get bread and soda, but does not remember what happened next.  He states "I got sick," but is unable to further elaborate, states only "I know I got sick because now I am here."  He does not remember feeling like he was going to pass out and denies any history of seizures.  He was reportedly found confused outside a gas station and EMS was called by a bystander.  Patient states he has been feeling well recently with no fevers, cough, chest pain, shortness of breath, vomiting, diarrhea, or abdominal pain.  He denies any alcohol or drug use.        Past Medical History:  Diagnosis Date  . Anxiety   . Arthritis   . Cancer [facility])    Prostate  . Diabetes mellitus without complication ([place])   . Hyperlipidemia   . Hypertension   . Neuropathy   . Neuropathy   . Sleep apnea     Patient Active Problem List   Diagnosis Date Noted  . Uncontrolled type 2 diabetes mellitus ([place]) 06/10/2019  . DKA (diabetic ketoacidoses) 11/25/2018  . PNA (pneumonia) 11/25/2018  . TIA (transient ischemic attack) 02/01/2017  . Cramps, muscle, general 07/04/2015    Past Surgical History:  Procedure Laterality Date  . APPENDECTOMY    . PROSTATECTOMY      Prior to Admission medications   Medication Sig Start Date End Date Taking? Authorizing Provider  acetaminophen (TYLENOL) 325 MG  tablet Take 2 tablets (650 mg total) by mouth every 6 (six) hours as needed for fever. 11/28/18   Gouru, Illene Silver, MD  insulin aspart (NOVOLOG FLEXPEN) 100 UNIT/ML FlexPen Inject 0-10 Units into the skin 3 (three) times daily with meals. 06/12/19   Hillary Bow, MD  Insulin Glargine (LANTUS SOLOSTAR) 100 UNIT/ML Solostar Pen Inject 25 Units into the skin daily. 06/12/19   Hillary Bow, MD  Insulin Pen Needle 32G X 4 MM MISC 1 Units by Does not apply route 4 (four) times daily. 06/12/19   Hillary Bow, MD  pregabalin (LYRICA) 100 MG capsule Take 1 capsule (100 mg total) by mouth 2 (two) times daily. 06/12/19   Hillary Bow, MD  rosuvastatin (CRESTOR) 20 MG tablet Take 1 tablet (20 mg total) by mouth daily. 06/12/19   Hillary Bow, MD    Allergies Tramadol  Family History  Problem Relation Age of Onset  . Cancer Father   . Stroke Father     Social History Social History   Tobacco Use  . Smoking status: Never Smoker  . Smokeless tobacco: Never Used  Vaping Use  . Vaping Use: Never used  Substance Use Topics  . Alcohol use: No  . Drug use: No    Review of Systems  Constitutional: No fever/chills.  Positive for confusion. Eyes: No visual changes. ENT: No sore throat. Cardiovascular: Denies chest pain.  Positive for syncope. Respiratory: Denies  shortness of breath. Gastrointestinal: No abdominal pain.  No nausea, no vomiting.  No diarrhea.  No constipation. Genitourinary: Negative for dysuria. Musculoskeletal: Negative for back pain. Skin: Negative for rash. Neurological: Negative for headaches, focal weakness or numbness.  ____________________________________________   PHYSICAL EXAM:  VITAL SIGNS: ED Triage Vitals  Enc Vitals Group     BP 08/12/20 1452 125/81     Pulse Rate 08/12/20 1452 81     Resp 08/12/20 1452 16     Temp 08/12/20 1452 97.6 F (36.4 C)     Temp Source 08/12/20 1452 Oral     SpO2 08/12/20 1452 96 %     Weight 08/12/20 1450 160 lb (72.6 kg)      Height 08/12/20 1450 5\' 9"  (1.753 m)     Head Circumference --      Peak Flow --      Pain Score 08/12/20 1450 0     Pain Loc --      Pain Edu? --      Excl. in [place]? --     Constitutional: Alert and oriented to person, place, time, and situation but slow to respond. Eyes: Conjunctivae are normal.  Pupils equal round and reactive to light bilaterally. Head: Atraumatic. Nose: No congestion/rhinnorhea. Mouth/Throat: Mucous membranes are moist. Neck: Normal ROM Cardiovascular: Normal rate, regular rhythm. Grossly normal heart sounds. Respiratory: Normal respiratory effort.  No retractions. Lungs CTAB. Gastrointestinal: Soft and nontender. No distention. Genitourinary: deferred Musculoskeletal: No lower extremity tenderness nor edema. Neurologic:  Normal speech and language. No gross focal neurologic deficits are appreciated. Skin:  Skin is warm, dry and intact. No rash noted. Psychiatric: Mood and affect are normal. Speech and behavior are normal.  ____________________________________________   LABS (all labs ordered are listed, but only abnormal results are displayed)  Labs Reviewed  COMPREHENSIVE METABOLIC PANEL - Abnormal; Notable for the following components:      Result Value   Sodium 130 (*)    Chloride 94 (*)    Glucose, Bld 565 (*)    All other components within normal limits  URINALYSIS, COMPLETE (UACMP) WITH MICROSCOPIC - Abnormal; Notable for the following components:   Color, Urine STRAW (*)    APPearance HAZY (*)    Glucose, UA >=500 (*)    Hgb urine dipstick LARGE (*)    All other components within normal limits  URINE DRUG SCREEN, QUALITATIVE ([facility] ONLY) - Abnormal; Notable for the following components:   Cocaine Metabolite,Ur [place] POSITIVE (*)    All other components within normal limits  CBG MONITORING, ED - Abnormal; Notable for the following components:   Glucose-Capillary 448 (*)    All other components within normal limits  TROPONIN I (HIGH  SENSITIVITY) - Abnormal; Notable for the following components:   Troponin I (High Sensitivity) 19 (*)    All other components within normal limits  RESP PANEL BY RT-PCR (FLU A&B, COVID) ARPGX2  CBC  ETHANOL  TROPONIN I (HIGH SENSITIVITY)   ____________________________________________  EKG  ED ECG REPORT I, Blake Divine, the attending physician, personally viewed and interpreted this ECG.   Date: 08/12/2020  EKG Time: 14:54  Rate: 72  Rhythm: normal sinus rhythm, multiple PVCs  Axis: Normal  Intervals:nonspecific intraventricular conduction delay  ST&T Change: None  ED ECG REPORT I, Blake Divine, the attending physician, personally viewed and interpreted this ECG.   Date: 08/12/2020  EKG Time: 15:55  Rate: 75  Rhythm: normal sinus rhythm  Axis: Normal  Intervals:nonspecific intraventricular conduction delay  ST&T Change: None    PROCEDURES  Procedure(s) performed (including Critical Care):  .1-3 Lead EKG Interpretation Performed by: Blake Divine, MD Authorized by: Blake Divine, MD     Interpretation: normal     ECG rate:  72   ECG rate assessment: normal     Rhythm: sinus rhythm     Ectopy: none     Conduction: normal       ____________________________________________   INITIAL IMPRESSION / ASSESSMENT AND PLAN / ED COURSE       54 year old male with past medical history of hypertension, hyperlipidemia, diabetes, and prostate cancer who presents to the ED for episode of altered mental status after he was found confused sitting on the ground outside of a gas station.  He is awake and alert on arrival to the ED, but slow to respond and having difficulty answering questions at times.  No focal neurologic deficits noted but we will further assess with CT head.  No evidence of trauma and he has no midline cervical spine tenderness.  EKG shows normal sinus rhythm with multiple PVCs, read by computer is concerning for lateral MI but no ST elevation noted  on my read, we will repeat as well.  Plan to check labs including CBC, CMP, troponin, ethanol, UA, UDS.  Vital signs reassuring and low suspicion for infectious process at this time, differential also includes syncope and seizure.  Repeat EKG shows read by computer as acute lateral MI, per my review there is questionable J-point elevation that is less than 1 mm and would not meet STEMI criteria, so denies chest pain or any other ACS equivalent.  We will check troponin and continue to monitor.  CT head reviewed by me and shows no obvious hemorrhage, negative for acute process per radiology.  Lab work is unremarkable and patient remains in normal sinus rhythm on cardiac monitor.  Most likely etiology of his episode seems to be syncope, would also consider HHS although this seems less likely given acute onset.  Labs show hyperglycemia but no evidence of DKA.  Case discussed with hospitalist for admission.      ____________________________________________   FINAL CLINICAL IMPRESSION(S) / ED DIAGNOSES  Final diagnoses:  Altered mental status, unspecified altered mental status type  Syncope, unspecified syncope type     ED Discharge Orders    None       Note:  This document was prepared using Dragon voice recognition software and may include unintentional dictation errors.   Blake Divine, MD 08/12/20 [phone]

## 2020-08-12 NOTE — H&P (Signed)
[facility] History and Physical  Christopher [name] [id number] DOB: Jul 26, 1966 DOA: 08/12/2020  Referring physician: Dr. Charna Archer PCP: Patient, No Pcp Per   Chief Complaint: Altered mental status  HPI: Christopher [name] is a 54 y.o. male with a history of poorly controlled diabetes, peripheral neuropathy, who presents after an episode of confusion.  Per ED signout patient was found confused at a gas station on the ground, EMS was called and he was brought to the hospital.  On my history patient reports that he members being at home, taking his medicines, and then deciding to go to the store he does not remember being at the store or his trip to the hospital.  He reports he has been taking all his medications as prescribed.  He denies use of excessive alcohol or any other substances.  He currently denies any other symptoms including chest pain, shortness of breath, abdominal pain, nausea, vomiting, diarrhea, burning or pain with urination.  He endorses paresthesias in his lower extremities but does not currently notice any difference from his baseline.  He denies any feeling of weakness anywhere in his body.  Spoke with patient's ex-wife who he lives with on speaker phone in patient's room.  She reports that he does not sound like himself and that she thinks his words sound slurred.  In the ED initial vital signs unremarkable.  Lab work-up notable for glucose of 565, CMP and CBC otherwise unremarkable.  Initial troponin was barely abnormal at 19, and was flat at 21 on recheck 2 hours later.  Respiratory viral panel was negative for flu and Covid.  UA showed glucose but no ketones or nitrite.  U tox was positive for cocaine but negative for other substances, serum alcohol level was less than 10.  CT head was performed that was unremarkable.  He was given a total of 2 L LR for fluid resuscitation and 8 units of NovoLog for his hyperglycemia.  Review of Systems:  Pertinent positives and negative  per HPI, all others reviewed and negative  Past Medical History:  Diagnosis Date  . Anxiety   . Arthritis   . Cancer [facility])    Prostate  . Diabetes mellitus without complication ([place])   . Hyperlipidemia   . Hypertension   . Neuropathy   . Neuropathy   . Sleep apnea    Past Surgical History:  Procedure Laterality Date  . APPENDECTOMY    . PROSTATECTOMY     Social History:  reports that he has never smoked. He has never used smokeless tobacco. He reports that he does not drink alcohol and does not use drugs.  Allergies  Allergen Reactions  . Tramadol Nausea And Vomiting    Family History  Problem Relation Age of Onset  . Cancer Father   . Stroke Father      Prior to Admission medications   Medication Sig Start Date End Date Taking? Authorizing Provider  acetaminophen (TYLENOL) 325 MG tablet Take 2 tablets (650 mg total) by mouth every 6 (six) hours as needed for fever. 11/28/18   Gouru, Illene Silver, MD  DULoxetine (CYMBALTA) 30 MG capsule Take 30 mg by mouth daily. 04/20/20   [provider]  insulin aspart (NOVOLOG FLEXPEN) 100 UNIT/ML FlexPen Inject 0-10 Units into the skin 3 (three) times daily with meals. 06/12/19   Hillary Bow, MD  Insulin Pen Needle 32G X 4 MM MISC 1 Units by Does not apply route 4 (four) times daily. 06/12/19   Hillary Bow, MD  lisinopril (  ZESTRIL) 5 MG tablet Take 5 mg by mouth daily. 04/20/20   [provider]  pregabalin (LYRICA) 100 MG capsule Take 1 capsule (100 mg total) by mouth 2 (two) times daily. 06/12/19   Hillary Bow, MD  rosuvastatin (CRESTOR) 20 MG tablet Take 1 tablet (20 mg total) by mouth daily. 06/12/19   Hillary Bow, MD   Physical Exam: Vitals:   08/12/20 1545 08/12/20 1600 08/12/20 1615 08/12/20 1636  BP:  108/77  127/87  Pulse: 74 65 72 64  Resp: 15 (!) 21 17 20   Temp:      TempSrc:      SpO2: 96% 96% 95% 98%  Weight:      Height:        Wt Readings from Last 3 Encounters:  08/12/20 72.6 kg   06/20/20 77.1 kg  04/06/20 86.2 kg     . General:  Appears calm and comfortable . Eyes: PERRL, normal lids, irises & conjunctiva . ENT: grossly normal hearing, lips & tongue . Neck: no LAD, masses or thyromegaly . Cardiovascular: RRR, no m/r/g. No LE edema. . Telemetry: SR, no arrhythmias  . Respiratory: CTA bilaterally, no w/r/r. Normal respiratory effort. . Abdomen: soft, ntnd . Skin: no rash or induration seen on limited exam . Musculoskeletal: grossly normal tone BUE/BLE . Psychiatric: grossly normal mood and affect, speech fluent and appropriate . Neurologic: grossly non-focal. Strength 5/5 in UE/LE bilaterally. CN intact. Speech slow and hesitant but not dysarthric to me. Oriented to person, place, time.           Labs on Admission:  Basic Metabolic Panel: Recent Labs  Lab 08/12/20 1452  NA 130*  K 4.3  CL 94*  CO2 26  GLUCOSE 565*  BUN 11  CREATININE 1.01  CALCIUM 9.5   Liver Function Tests: Recent Labs  Lab 08/12/20 1452  AST 19  ALT 26  ALKPHOS 59  BILITOT 0.8  PROT 7.9  ALBUMIN 4.2   No results for input(s): LIPASE, AMYLASE in the last 168 hours. No results for input(s): AMMONIA in the last 168 hours. CBC: Recent Labs  Lab 08/12/20 1452  WBC 9.1  HGB 13.4  HCT 41.2  MCV 83.4  PLT 317   Cardiac Enzymes: No results for input(s): CKTOTAL, CKMB, CKMBINDEX, TROPONINI in the last 168 hours.  BNP (last 3 results) No results for input(s): BNP in the last 8760 hours.  ProBNP (last 3 results) No results for input(s): PROBNP in the last 8760 hours.  CBG: Recent Labs  Lab 08/12/20 1711 08/12/20 1911  GLUCAP 448* 160*    Radiological Exams on Admission: CT Head Wo Contrast  Result Date: 08/12/2020 CLINICAL DATA:  Mental status changes of uncertain etiology, found sitting outside a gas station shaking in itching, not talking, hyperglycemia, history diabetes mellitus EXAM: CT HEAD WITHOUT CONTRAST TECHNIQUE: Contiguous axial images were  obtained from the base of the skull through the vertex without intravenous contrast. Sagittal and coronal MPR images reconstructed from axial data set. COMPARISON:  02/16/2018 FINDINGS: Brain: Normal ventricular morphology. No midline shift or mass effect. Normal appearance of brain parenchyma. No intracranial hemorrhage, mass lesion, or evidence of acute infarction. No extra-axial fluid collections. Vascular: No hyperdense vessels Skull: Intact Sinuses/Orbits: Clear Other: N/A IMPRESSION: Normal exam. Electronically Signed   By: Lavonia Dana M.D.   On: 08/12/2020 16:30    EKG: Independently reviewed.  Sinus rhythm, T wave slurring in inferior leads of 3 and aVF as well as V6, what appears  to be J-point elevation in aVL and V2.  Overall EKG findings are similar to prior from April 2020 although somewhat more pronounced.  Assessment/Plan Active Problems:   AMS (altered mental status)  #Altered mental status #Transient alteration in consciousness #Dysarthria? Patient presenting after episode of altered awareness without clear etiology.  On my exam there are no focal findings.  No acute intracranial pathology on CT scan.  Glucose significantly elevated, HHS is a possibility though would expect patient to be more obtunded and presently he is able to fully engage in interview and AO x3.  U tox positive for cocaine, though screen does not test for all substances, and notable from exam in a dark room the pupils were fairly constricted during my cranial nerve exam.  Seizure is a possibility, did not have any bowel or bladder incontinence, CT head was normal, but this is possible though would be a fairly prolonged postictal state-feel like this is less likely.  Stroke is a possibility but not commonly associated with memory loss and patient has no focal neurological deficits.  No signs of infection to suggest delirium.  Low suspicion for cardiac event given lack of symptoms will monitor overnight. -Monitor  overnight, if continues to appear confused and dysarthric consider neurology consultation and possible stroke/seizure work-up -Telemetry  #Elevated troponin Mildly elevated troponin on arrival.  U tox positive for cocaine which is most likely etiology of transient elevation, no significant changes to EKG. -Trend troponin for 1 more value  #Chronic medical problems DM: Appropriate diet, start Lantus 10 units nightly, continue sliding scale insulin Neuropathy: Hold Lyrica and Cymbalta in setting of AMS Hyperlipidemia: Hold rosuvastatin Hypertension: Hold lisinopril in setting of normal pressures  Code Status: Full Code DVT Prophylaxis: Lovenox Family Communication: Spoke with ex-wife by phone Disposition Plan: Admit to observation   Time spent: 50 min  Clarnce Flock MD/MPH [facility]

## 2020-08-12 NOTE — ED Triage Notes (Signed)
Pt arrived via [facility] from a gas station with an unknown complaint. Pt was found sitting outside shaking and itchy and not talking to ems. Pt has hx of DM-349 with ems. Pt was very lethargic with ems. 138/86.

## 2020-08-12 NOTE — ED Notes (Signed)
Patient in stretcher, c/o bilateral LE cramps d/t laying still. Pt requesting food and drink. Pt reminded he is NPO at this time.  CBG 160. VSS.

## 2020-08-12 NOTE — ED Notes (Signed)
Date and time results received: 08/12/20 3:33 PM  (use smartphrase ".now" to insert current time)  Test: Glucose Critical Value: 565  Name of Provider Notified: jessup  Orders Received? Or Actions Taken?: Orders Received - See Orders for details

## 2020-08-13 ENCOUNTER — Encounter: Payer: Self-pay | Admitting: Family Medicine

## 2020-08-13 DIAGNOSIS — G9341 Metabolic encephalopathy: Secondary | ICD-10-CM | POA: Diagnosis not present

## 2020-08-13 DIAGNOSIS — E1165 Type 2 diabetes mellitus with hyperglycemia: Secondary | ICD-10-CM | POA: Diagnosis not present

## 2020-08-13 DIAGNOSIS — I10 Essential (primary) hypertension: Secondary | ICD-10-CM

## 2020-08-13 DIAGNOSIS — E785 Hyperlipidemia, unspecified: Secondary | ICD-10-CM | POA: Diagnosis not present

## 2020-08-13 DIAGNOSIS — E1142 Type 2 diabetes mellitus with diabetic polyneuropathy: Secondary | ICD-10-CM | POA: Diagnosis not present

## 2020-08-13 DIAGNOSIS — Z794 Long term (current) use of insulin: Secondary | ICD-10-CM

## 2020-08-13 LAB — COMPREHENSIVE METABOLIC PANEL
ALT: 31 U/L (ref 0–44)
AST: 45 U/L — ABNORMAL HIGH (ref 15–41)
Albumin: 3.6 g/dL (ref 3.5–5.0)
Alkaline Phosphatase: 51 U/L (ref 38–126)
Anion gap: 8 (ref 5–15)
BUN: 11 mg/dL (ref 6–20)
CO2: 30 mmol/L (ref 22–32)
Calcium: 9.5 mg/dL (ref 8.9–10.3)
Chloride: 96 mmol/L — ABNORMAL LOW (ref 98–111)
Creatinine, Ser: 0.82 mg/dL (ref 0.61–1.24)
GFR, Estimated: >60 mL/min (ref >60)
Glucose, Bld: 361 mg/dL — ABNORMAL HIGH (ref 70–99)
Potassium: 4.1 mmol/L (ref 3.5–5.1)
Sodium: 134 mmol/L — ABNORMAL LOW (ref 135–145)
Total Bilirubin: 0.5 mg/dL (ref 0.3–1.2)
Total Protein: 6.8 g/dL (ref 6.5–8.1)

## 2020-08-13 LAB — HEMOGLOBIN A1C
Hgb A1c MFr Bld: 14.6 % — ABNORMAL HIGH (ref 4.8–5.6)
Mean Plasma Glucose: 372.32 mg/dL

## 2020-08-13 LAB — CBC
HCT: 38.9 % — ABNORMAL LOW (ref 39.0–52.0)
Hemoglobin: 12.3 g/dL — ABNORMAL LOW (ref 13.0–17.0)
MCH: 27 pg (ref 26.0–34.0)
MCHC: 31.6 g/dL (ref 30.0–36.0)
MCV: 85.5 fL (ref 80.0–100.0)
Platelets: 266 10*3/uL (ref 150–400)
RBC: 4.55 MIL/uL (ref 4.22–5.81)
RDW: 13 % (ref 11.5–15.5)
WBC: 6.7 10*3/uL (ref 4.0–10.5)
nRBC: 0 % (ref 0.0–0.2)

## 2020-08-13 LAB — TROPONIN I (HIGH SENSITIVITY)
Troponin I (High Sensitivity): 28 ng/L — ABNORMAL HIGH (ref <18)
Troponin I (High Sensitivity): 30 ng/L — ABNORMAL HIGH (ref <18)

## 2020-08-13 LAB — GLUCOSE, CAPILLARY
Glucose-Capillary: 179 mg/dL — ABNORMAL HIGH (ref 70–99)
Glucose-Capillary: 227 mg/dL — ABNORMAL HIGH (ref 70–99)

## 2020-08-13 LAB — HIV ANTIBODY (ROUTINE TESTING W REFLEX): HIV Screen 4th Generation wRfx: NONREACTIVE

## 2020-08-13 MED ORDER — INSULIN PEN NEEDLE 32G X 4 MM MISC: 1.0000 [IU] | Freq: Four times a day (QID) | 0 refills | Status: AC

## 2020-08-13 MED ORDER — DULOXETINE HCL 30 MG PO CPEP: 30.0000 mg | ORAL_CAPSULE | Freq: Every day | ORAL | 0 refills | Status: AC

## 2020-08-13 MED ORDER — INSULIN GLARGINE 100 UNIT/ML SOLOSTAR PEN: 15.0000 [IU] | PEN_INJECTOR | Freq: Every day | SUBCUTANEOUS | 0 refills | Status: AC

## 2020-08-13 MED ORDER — NOVOLOG FLEXPEN 100 UNIT/ML ~~LOC~~ SOPN: 6.0000 [IU] | PEN_INJECTOR | Freq: Three times a day (TID) | SUBCUTANEOUS | 0 refills | Status: AC

## 2020-08-13 NOTE — Discharge Summary (Signed)
[facility] NAME: Christopher [name]    MR#:  [id number]  DATE OF BIRTH:  06-14-1966  DATE OF ADMISSION:  08/12/2020 ADMITTING PHYSICIAN: Clarnce Flock, MD  DATE OF DISCHARGE: 08/13/2020 12:26 PM  PRIMARY CARE PHYSICIAN: Patient, No Pcp Per    ADMISSION DIAGNOSIS:  Altered mental status, unspecified altered mental status type [R41.82] Syncope, unspecified syncope type [R55] AMS (altered mental status) [R41.82]  DISCHARGE DIAGNOSIS:  Active Problems:   AMS (altered mental status)   SECONDARY DIAGNOSIS:   Past Medical History:  Diagnosis Date  . Anxiety   . Arthritis   . Cancer [facility])    Prostate  . Diabetes mellitus without complication ([place])   . Hyperlipidemia   . Hypertension   . Neuropathy   . Neuropathy   . Sleep apnea     HOSPITAL COURSE:   1.  Acute metabolic encephalopathy.  The patient went to a store and did not recall what happened and ended up in the hospital.  He remembers being in the hospital.  Offers no complaints today.  Did not have bowel or urine incontinence or tongue bite.  Patient's urine toxicology was positive for cocaine.  The patient denies using any drugs.  The patient sugars were very elevated. 2.  Borderline elevated troponin demand ischemia from cocaine in the urine toxicology. 3.  Type 2 diabetes mellitus with hyperglycemia.  Prescribe Lantus 15 units at nighttime.  Continue short acting insulin prior to meals.  Patient states that he has a glucometer.  Hemoglobin A1c very elevated at 14.6. 4.  Diabetic neuropathy can go back on Lyrica 5.  Hyperlipidemia unspecified on Crestor 6.  Essential hypertension blood pressure okay holding lisinopril  DISCHARGE CONDITIONS:   Satisfactory  CONSULTS OBTAINED:  None  DRUG ALLERGIES:   Allergies  Allergen Reactions  . Tramadol Nausea And Vomiting    DISCHARGE MEDICATIONS:   Allergies as of 08/13/2020      Reactions   Tramadol Nausea  And Vomiting      Medication List    STOP taking these medications   lisinopril 5 MG tablet Commonly known as: ZESTRIL     TAKE these medications   acetaminophen 325 MG tablet Commonly known as: TYLENOL Take 2 tablets (650 mg total) by mouth every 6 (six) hours as needed for fever.   DULoxetine 30 MG capsule Commonly known as: CYMBALTA Take 1 capsule (30 mg total) by mouth daily.   insulin glargine 100 UNIT/ML Solostar Pen Commonly known as: LANTUS Inject 15 Units into the skin at bedtime.   Insulin Pen Needle 32G X 4 MM Misc 1 Units by Does not apply route 4 (four) times daily.   NovoLOG FlexPen 100 UNIT/ML FlexPen Generic drug: insulin aspart Inject 6 Units into the skin 3 (three) times daily with meals. What changed: how much to take   pregabalin 100 MG capsule Commonly known as: LYRICA Take 1 capsule (100 mg total) by mouth 2 (two) times daily.   rosuvastatin 20 MG tablet Commonly known as: CRESTOR Take 1 tablet (20 mg total) by mouth daily.        DISCHARGE INSTRUCTIONS:   Follow-up medical doctor on your Medicaid card  If you experience worsening of your admission symptoms, develop shortness of breath, life threatening emergency, suicidal or homicidal thoughts you must seek medical attention immediately by calling 911 or calling your MD immediately  if symptoms less severe.  You Must read complete instructions/literature along with  all the possible adverse reactions/side effects for all the Medicines you take and that have been prescribed to you. Take any new Medicines after you have completely understood and accept all the possible adverse reactions/side effects.   Please note  You were cared for by a hospitalist during your hospital stay. If you have any questions about your discharge medications or the care you received while you were in the hospital after you are discharged, you can call the unit and asked to speak with the hospitalist on call if the  hospitalist that took care of you is not available. Once you are discharged, your primary care physician will handle any further medical issues. Please note that NO REFILLS for any discharge medications will be authorized once you are discharged, as it is imperative that you return to your primary care physician (or establish a relationship with a primary care physician if you do not have one) for your aftercare needs so that they can reassess your need for medications and monitor your lab values.    Today   CHIEF COMPLAINT:   Chief Complaint  Patient presents with  . Altered Mental Status    HISTORY OF PRESENT ILLNESS:  Christopher [name]  is a 54 y.o. male came in with altered mental   VITAL SIGNS:  Blood pressure 120/72, pulse 70, temperature 97.6 F (36.4 C), resp. rate 17, height 5\' 9"  (1.753 m), weight 68.2 kg, SpO2 100 %.  I/O:    Intake/Output Summary (Last 24 hours) at 08/13/2020 1539 Last data filed at 08/13/2020 0900 Gross per 24 hour  Intake 1600 ml  Output 2000 ml  Net -400 ml    PHYSICAL EXAMINATION:  GENERAL:  54 y.o.-year-old patient lying in the bed with no acute distress.  EYES: Pupils equal, round, reactive to light and accommodation. No scleral icterus. HEENT: Head atraumatic, normocephalic. Oropharynx and nasopharynx clear.  LUNGS: Normal breath sounds bilaterally, no wheezing, rales,rhonchi or crepitation. No use of accessory muscles of respiration.  CARDIOVASCULAR: S1, S2 normal. No murmurs, rubs, or gallops.  ABDOMEN: Soft, non-tender, non-distended. Bowel sounds present. No organomegaly or mass.  EXTREMITIES: No pedal edema.  NEUROLOGIC: Cranial nerves II through XII are intact. Muscle strength 5/5 in all extremities. Sensation intact. Gait not checked.  PSYCHIATRIC: The patient is alert and oriented x 3.  SKIN: No obvious rash, lesion, or ulcer.   DATA REVIEW:   CBC Recent Labs  Lab 08/13/20 0154  WBC 6.7  HGB 12.3*  HCT 38.9*  PLT 266     Chemistries  Recent Labs  Lab 08/13/20 0154  NA 134*  K 4.1  CL 96*  CO2 30  GLUCOSE 361*  BUN 11  CREATININE 0.82  CALCIUM 9.5  AST 45*  ALT 31  ALKPHOS 51  BILITOT 0.5     Microbiology Results  Results for orders placed or performed during the hospital encounter of 08/12/20  Resp Panel by RT-PCR (Flu A&B, Covid) Nasopharyngeal Swab     Status: None   Collection Time: 08/12/20  4:38 PM   Specimen: Nasopharyngeal Swab; Nasopharyngeal(NP) swabs in vial transport medium  Result Value Ref Range Status   SARS Coronavirus 2 by RT PCR NEGATIVE NEGATIVE Final    Comment: (NOTE) SARS-CoV-2 target nucleic acids are NOT DETECTED.  The SARS-CoV-2 RNA is generally detectable in upper respiratory specimens during the acute phase of infection. The lowest concentration of SARS-CoV-2 viral copies this assay can detect is 138 copies/mL. A negative result does not preclude SARS-Cov-2  infection and should not be used as the sole basis for treatment or other patient management decisions. A negative result may occur with  improper specimen collection/handling, submission of specimen other than nasopharyngeal swab, presence of viral mutation(s) within the areas targeted by this assay, and inadequate number of viral copies(<138 copies/mL). A negative result must be combined with clinical observations, patient history, and epidemiological information. The expected result is Negative.  Fact Sheet for Patients:  BloggerCourse.com  Fact Sheet for Healthcare Providers:  SeriousBroker.it  This test is no t yet approved or cleared by the [place] FDA and  has been authorized for detection and/or diagnosis of SARS-CoV-2 by FDA under an Emergency Use Authorization (EUA). This EUA will remain  in effect (meaning this test can be used) for the duration of the COVID-19 declaration under Section 564(b)(1) of the Act, 21 U.S.C.section  360bbb-3(b)(1), unless the authorization is terminated  or revoked sooner.       Influenza A by PCR NEGATIVE NEGATIVE Final   Influenza B by PCR NEGATIVE NEGATIVE Final    Comment: (NOTE) The Xpert Xpress SARS-CoV-2/FLU/RSV plus assay is intended as an aid in the diagnosis of influenza from Nasopharyngeal swab specimens and should not be used as a sole basis for treatment. Nasal washings and aspirates are unacceptable for Xpert Xpress SARS-CoV-2/FLU/RSV testing.  Fact Sheet for Patients: BloggerCourse.com  Fact Sheet for Healthcare Providers: SeriousBroker.it  This test is not yet approved or cleared by the [place] FDA and has been authorized for detection and/or diagnosis of SARS-CoV-2 by FDA under an Emergency Use Authorization (EUA). This EUA will remain in effect (meaning this test can be used) for the duration of the COVID-19 declaration under Section 564(b)(1) of the Act, 21 U.S.C. section 360bbb-3(b)(1), unless the authorization is terminated or revoked.  Performed at [facility], [address] [place], [place] [address]     RADIOLOGY:  CT Head Wo Contrast  Result Date: 08/12/2020 CLINICAL DATA:  Mental status changes of uncertain etiology, found sitting outside a gas station shaking in itching, not talking, hyperglycemia, history diabetes mellitus EXAM: CT HEAD WITHOUT CONTRAST TECHNIQUE: Contiguous axial images were obtained from the base of the skull through the vertex without intravenous contrast. Sagittal and coronal MPR images reconstructed from axial data set. COMPARISON:  02/16/2018 FINDINGS: Brain: Normal ventricular morphology. No midline shift or mass effect. Normal appearance of brain parenchyma. No intracranial hemorrhage, mass lesion, or evidence of acute infarction. No extra-axial fluid collections. Vascular: No hyperdense vessels Skull: Intact Sinuses/Orbits: Clear Other: N/A IMPRESSION:  Normal exam. Electronically Signed   By: Ulyses Southward M.D.   On: 08/12/2020 16:30     Management plans discussed with the patient, family and they are in agreement.  CODE STATUS:     Code Status Orders  (From admission, onward)         Start     Ordered   08/12/20 2017  Full code  Continuous        08/12/20 2017        Code Status History    Date Active Date Inactive Code Status Order ID Comments User Context   06/10/2019 2330 06/12/2019 2051 Full Code [id number]  [facility] Care Vernetta Honey, MD ED   11/25/2018 2200 11/28/2018 1853 Full Code [id number]  Ramonita Lab, MD ED   02/01/2017 0630 02/02/2017 1639 Full Code [id number]  Arnaldo Natal, MD Inpatient   07/04/2015 2354 07/06/2015 2235 Full Code [id number]  Milagros Loll, MD ED  Advance Care Planning Activity      TOTAL TIME TAKING CARE OF THIS PATIENT: 34 minutes.    Alford Highland M.D on 08/13/2020 at 3:39 PM  Between 7am to 6pm - Pager - [phone]  After 6pm go to [email or web address] - password [email or web address]  [facility]  CC: Primary care physician; Patient, No Pcp Per

## 2020-08-13 NOTE — Progress Notes (Signed)
Admitted patient from ED, transported via stretcher. Pt is aox3, VSS, c/o of low back pain 8/10. History obtained from patient. Call bell placed within reach. Orientation given. Fluids offered. Medicated with tylenol for pain.

## 2020-08-13 NOTE — TOC Progression Note (Addendum)
Transition of Care [facility]) - Progression Note    Patient Details  Name: Christopher [name] MRN: [id number] Date of Birth: 1966/01/21  Transition of [facility]) CM/SW Contact  Izola Price, RN Phone Number: 08/13/2020, 10:01 AM  Clinical Narrative:    1127 Update: Voucher for [employer] of [place] given to patient Loss adjuster, chartered. D/C today.  Called for service and fee first. $44.00 no time limits today for service. Barbie Cydne Grahn RNCM  12/25 1000 am.   [facility] consult for transportation issues on discharge.  All transportation services limited today by Holiday.   [facility] [facility] [employer] not answering phone and message indicates not open some Holidays.   Contacted patient to see what the issues was and his car in in the shop and all friends contacted are at family gatherings for Christmas or are not answering call.   CM will monitory for discharge and explore options.   On call leadership contacted as well [facility] if they will accept--if patient is  discharged. Patient  was unsure of dc plans. Simmie Davies RN CM           Expected Discharge Plan and Services Discharge Home Self Care/Taxi Voucher given.            Expected Discharge Date: 08/13/20                                     Social Determinants of Health (SDOH) Interventions    Readmission Risk Interventions Readmission Risk Prevention Plan 11/26/2018  Post Dischage Appt Complete  Medication Screening Complete  Transportation Screening Complete  Some recent data might be hidden

## 2020-08-13 NOTE — Progress Notes (Addendum)
Patient is being discharged home.  Discharge papers given and explained to patient.  Pt verbalized understanding.  Meds and f/u appointment reviewed.  Rx sent electronically to pharmacy.  Patient made aware.  Awaiting taxi for transportation.

## 2020-08-14 ENCOUNTER — Emergency Department: Payer: Medicaid Other

## 2020-08-14 ENCOUNTER — Encounter: Payer: Self-pay | Admitting: Radiology

## 2020-08-14 ENCOUNTER — Emergency Department
Admission: EM | Admit: 2020-08-14 | Discharge: 2020-08-14 | Disposition: A | Discharge status: Home / Self Care | Payer: Medicaid Other | Attending: Emergency Medicine | Admitting: Emergency Medicine

## 2020-08-14 ENCOUNTER — Other Ambulatory Visit: Payer: Self-pay

## 2020-08-14 DIAGNOSIS — R739 Hyperglycemia, unspecified: Secondary | ICD-10-CM | POA: Diagnosis present

## 2020-08-14 DIAGNOSIS — Z794 Long term (current) use of insulin: Secondary | ICD-10-CM | POA: Diagnosis not present

## 2020-08-14 DIAGNOSIS — R410 Disorientation, unspecified: Secondary | ICD-10-CM | POA: Diagnosis not present

## 2020-08-14 DIAGNOSIS — Z8673 Personal history of transient ischemic attack (TIA), and cerebral infarction without residual deficits: Secondary | ICD-10-CM | POA: Insufficient documentation

## 2020-08-14 DIAGNOSIS — E1110 Type 2 diabetes mellitus with ketoacidosis without coma: Secondary | ICD-10-CM | POA: Diagnosis not present

## 2020-08-14 DIAGNOSIS — Z79899 Other long term (current) drug therapy: Secondary | ICD-10-CM | POA: Insufficient documentation

## 2020-08-14 DIAGNOSIS — I10 Essential (primary) hypertension: Secondary | ICD-10-CM | POA: Insufficient documentation

## 2020-08-14 DIAGNOSIS — E1165 Type 2 diabetes mellitus with hyperglycemia: Secondary | ICD-10-CM | POA: Diagnosis not present

## 2020-08-14 DIAGNOSIS — E860 Dehydration: Secondary | ICD-10-CM | POA: Insufficient documentation

## 2020-08-14 DIAGNOSIS — Z8546 Personal history of malignant neoplasm of prostate: Secondary | ICD-10-CM | POA: Insufficient documentation

## 2020-08-14 DIAGNOSIS — E1142 Type 2 diabetes mellitus with diabetic polyneuropathy: Secondary | ICD-10-CM | POA: Insufficient documentation

## 2020-08-14 LAB — CBC WITH DIFFERENTIAL/PLATELET
Abs Immature Granulocytes: 0.04 10*3/uL (ref 0.00–0.07)
Basophils Absolute: 0 10*3/uL (ref 0.0–0.1)
Basophils Relative: 1 %
Eosinophils Absolute: 0.1 10*3/uL (ref 0.0–0.5)
Eosinophils Relative: 2 %
HCT: 41.6 % (ref 39.0–52.0)
Hemoglobin: 13.1 g/dL (ref 13.0–17.0)
Immature Granulocytes: 1 %
Lymphocytes Relative: 36 %
Lymphs Abs: 2.1 10*3/uL (ref 0.7–4.0)
MCH: 27.4 pg (ref 26.0–34.0)
MCHC: 31.5 g/dL (ref 30.0–36.0)
MCV: 87 fL (ref 80.0–100.0)
Monocytes Absolute: 0.7 10*3/uL (ref 0.1–1.0)
Monocytes Relative: 11 %
Neutro Abs: 2.9 10*3/uL (ref 1.7–7.7)
Neutrophils Relative %: 49 %
Platelets: 262 10*3/uL (ref 150–400)
RBC: 4.78 MIL/uL (ref 4.22–5.81)
RDW: 12.8 % (ref 11.5–15.5)
WBC: 5.8 10*3/uL (ref 4.0–10.5)
nRBC: 0 % (ref 0.0–0.2)

## 2020-08-14 LAB — COMPREHENSIVE METABOLIC PANEL
ALT: 99 U/L — ABNORMAL HIGH (ref 0–44)
AST: 208 U/L — ABNORMAL HIGH (ref 15–41)
Albumin: 4.2 g/dL (ref 3.5–5.0)
Alkaline Phosphatase: 71 U/L (ref 38–126)
Anion gap: 13 (ref 5–15)
BUN: 10 mg/dL (ref 6–20)
CO2: 23 mmol/L (ref 22–32)
Calcium: 9.6 mg/dL (ref 8.9–10.3)
Chloride: 95 mmol/L — ABNORMAL LOW (ref 98–111)
Creatinine, Ser: 1.06 mg/dL (ref 0.61–1.24)
GFR, Estimated: >60 mL/min (ref >60)
Glucose, Bld: 587 mg/dL (ref 70–99)
Potassium: 3.8 mmol/L (ref 3.5–5.1)
Sodium: 131 mmol/L — ABNORMAL LOW (ref 135–145)
Total Bilirubin: 0.9 mg/dL (ref 0.3–1.2)
Total Protein: 7.8 g/dL (ref 6.5–8.1)

## 2020-08-14 LAB — CBG MONITORING, ED
Glucose-Capillary: 495 mg/dL — ABNORMAL HIGH (ref 70–99)
Glucose-Capillary: 439 mg/dL — ABNORMAL HIGH (ref 70–99)
Glucose-Capillary: 230 mg/dL — ABNORMAL HIGH (ref 70–99)

## 2020-08-14 LAB — URINE DRUG SCREEN, QUALITATIVE (ARMC ONLY)
Amphetamines, Ur Screen: NOT DETECTED
Barbiturates, Ur Screen: NOT DETECTED
Benzodiazepine, Ur Scrn: NOT DETECTED
Cannabinoid 50 Ng, Ur ~~LOC~~: NOT DETECTED
Cocaine Metabolite,Ur ~~LOC~~: POSITIVE — AB
MDMA (Ecstasy)Ur Screen: NOT DETECTED
Methadone Scn, Ur: NOT DETECTED
Opiate, Ur Screen: NOT DETECTED
Phencyclidine (PCP) Ur S: NOT DETECTED
Tricyclic, Ur Screen: NOT DETECTED

## 2020-08-14 LAB — URINALYSIS, COMPLETE (UACMP) WITH MICROSCOPIC
Bacteria, UA: NONE SEEN
Bilirubin Urine: NEGATIVE
Glucose, UA: >=500 mg/dL — AB
Ketones, ur: 5 mg/dL — AB
Leukocytes,Ua: NEGATIVE
Nitrite: NEGATIVE
Protein, ur: NEGATIVE mg/dL
Specific Gravity, Urine: 1.031 — ABNORMAL HIGH (ref 1.005–1.030)
pH: 5 (ref 5.0–8.0)

## 2020-08-14 LAB — LIPASE, BLOOD: Lipase: 30 U/L (ref 11–51)

## 2020-08-14 LAB — ETHANOL: Alcohol, Ethyl (B): <10 mg/dL (ref <10)

## 2020-08-14 MED ORDER — ONDANSETRON HCL 4 MG/2ML IJ SOLN
4.0000 mg | Freq: Once | INTRAMUSCULAR | Status: AC
  Administered 2020-08-14: 08:00:00 4 mg via INTRAVENOUS
  Filled 2020-08-14: qty 2

## 2020-08-14 MED ORDER — SODIUM CHLORIDE 0.9 % IV BOLUS
1000.0000 mL | Freq: Once | INTRAVENOUS | Status: AC
  Administered 2020-08-14: 08:00:00 1000 mL via INTRAVENOUS

## 2020-08-14 MED ORDER — IOHEXOL 300 MG/ML  SOLN
100.0000 mL | Freq: Once | INTRAMUSCULAR | Status: AC | PRN
  Administered 2020-08-14: 09:00:00 100 mL via INTRAVENOUS

## 2020-08-14 MED ORDER — SODIUM CHLORIDE 0.9 % IV BOLUS
1000.0000 mL | Freq: Once | INTRAVENOUS | Status: AC
  Administered 2020-08-14: 11:00:00 1000 mL via INTRAVENOUS

## 2020-08-14 MED ORDER — INSULIN ASPART 100 UNIT/ML ~~LOC~~ SOLN
8.0000 [IU] | Freq: Once | SUBCUTANEOUS | Status: AC
  Administered 2020-08-14: 10:00:00 8 [IU] via INTRAVENOUS
  Filled 2020-08-14: qty 1

## 2020-08-14 NOTE — ED Notes (Signed)
Pt back from CT

## 2020-08-14 NOTE — ED Notes (Signed)
EDP at bedside discussing plan of care with pt.  

## 2020-08-14 NOTE — ED Notes (Signed)
EDP at bedside  

## 2020-08-14 NOTE — ED Notes (Signed)
Pt taken to CT.

## 2020-08-14 NOTE — ED Provider Notes (Signed)
[facility] Emergency Department Provider Note  ____________________________________________  Time seen: Approximately 8:30 AM  I have reviewed the triage vital signs and the nursing notes.   HISTORY  Chief Complaint Hyperglycemia    Level 5 Caveat: Portions of the History and Physical including HPI and review of systems are unable to be completely obtained due to patient being a poor historian   HPI Christopher [name] is a 54 y.o. male with a history of hypertension diabetes who is brought to the ED due to being found unconscious wedged between his bed and his wall. EMS were concerned that he may have had a opiate overdose and gave a total of 3 mg of Narcan, and they report that he did improve after Narcan. They gave 500 mL of normal saline bolus.  Patient denies any pain. He denies any drug use. He does endorse feeling nauseated. No diarrhea or constipation. No headaches or vision change. No paresthesias or motor weakness. Other than nausea he denies any symptoms. No chest pain or shortness of breath.      Past Medical History:  Diagnosis Date  . Anxiety   . Arthritis   . Cancer [facility])    Prostate  . Diabetes mellitus without complication ([place])   . Hyperlipidemia   . Hypertension   . Neuropathy   . Neuropathy   . Sleep apnea      Patient Active Problem List   Diagnosis Date Noted  . Acute metabolic encephalopathy   . Diabetic polyneuropathy associated with type 2 diabetes mellitus ([place])   . Hyperlipidemia   . Essential hypertension   . AMS (altered mental status) 08/12/2020  . Type 2 diabetes mellitus with hyperglycemia, with long-term current use of insulin ([place]) 06/10/2019  . DKA (diabetic ketoacidoses) 11/25/2018  . PNA (pneumonia) 11/25/2018  . TIA (transient ischemic attack) 02/01/2017  . Cramps, muscle, general 07/04/2015     Past Surgical History:  Procedure Laterality Date  . APPENDECTOMY    . PROSTATECTOMY       Prior to  Admission medications   Medication Sig Start Date End Date Taking? Authorizing Provider  acetaminophen (TYLENOL) 325 MG tablet Take 2 tablets (650 mg total) by mouth every 6 (six) hours as needed for fever. 11/28/18   Gouru, Illene Silver, MD  DULoxetine (CYMBALTA) 30 MG capsule Take 1 capsule (30 mg total) by mouth daily. 08/13/20   Loletha Grayer, MD  insulin aspart (NOVOLOG FLEXPEN) 100 UNIT/ML FlexPen Inject 6 Units into the skin 3 (three) times daily with meals. 08/13/20   Loletha Grayer, MD  insulin glargine (LANTUS) 100 UNIT/ML Solostar Pen Inject 15 Units into the skin at bedtime. 08/13/20   Loletha Grayer, MD  Insulin Pen Needle 32G X 4 MM MISC 1 Units by Does not apply route 4 (four) times daily. 08/13/20   Loletha Grayer, MD  pregabalin (LYRICA) 100 MG capsule Take 1 capsule (100 mg total) by mouth 2 (two) times daily. 06/12/19   Hillary Bow, MD  rosuvastatin (CRESTOR) 20 MG tablet Take 1 tablet (20 mg total) by mouth daily. 06/12/19   Hillary Bow, MD     Allergies Tramadol   Family History  Problem Relation Age of Onset  . Cancer Father   . Stroke Father     Social History Social History   Tobacco Use  . Smoking status: Never Smoker  . Smokeless tobacco: Never Used  Vaping Use  . Vaping Use: Never used  Substance Use Topics  . Alcohol use: No  .  Drug use: No    Review of Systems Level 5 Caveat: Portions of the History and Physical including HPI and review of systems are unable to be completely obtained due to patient being a poor historian   Constitutional:   No known fever.  ENT:   No rhinorrhea. Cardiovascular:   No chest pain or syncope. Respiratory:   No dyspnea or cough. Gastrointestinal:   Negative for abdominal pain, vomiting and diarrhea.  Musculoskeletal:   Negative for focal pain or swelling ____________________________________________   PHYSICAL EXAM:  VITAL SIGNS: ED Triage Vitals  Enc Vitals Group     BP 08/14/20 0700 138/80     Pulse  Rate 08/14/20 0645 60     Resp 08/14/20 0645 (!) 9     Temp 08/14/20 0636 (!) 96.4 F (35.8 C)     Temp Source 08/14/20 0636 Axillary     SpO2 08/14/20 0645 99 %     Weight 08/14/20 0639 163 lb 12.8 oz (74.3 kg)     Height 08/14/20 0639 5\' 9"  (1.753 m)     Head Circumference --      Peak Flow --      Pain Score 08/14/20 0639 0     Pain Loc --      Pain Edu? --      Excl. in [place]? --     Vital signs reviewed, nursing assessments reviewed.   Constitutional: Awake and alert, oriented to person and place. Non-toxic appearance. Eyes:   Conjunctivae are normal. EOMI. PERRL. ENT      Head:   Normocephalic and atraumatic.      Nose:   No congestion/rhinnorhea.       Mouth/Throat:   Dry mucous membranes, no pharyngeal erythema. No peritonsillar mass.       Neck:   No meningismus. Full ROM. Hematological/Lymphatic/Immunilogical:   No cervical lymphadenopathy. Cardiovascular:   RRR. Symmetric bilateral radial and DP pulses.  No murmurs. Cap refill less than 2 seconds. Respiratory:   Normal respiratory effort without tachypnea/retractions. Breath sounds are clear and equal bilaterally. No wheezes/rales/rhonchi. Gastrointestinal:   Soft with mild lower abdominal tenderness. Non distended. There is no CVA tenderness.  No rebound, rigidity, or guarding.  Musculoskeletal:   Normal range of motion in all extremities. No joint effusions.  No lower extremity tenderness.  No edema. Neurologic:   Normal speech and language.  Motor grossly intact. No acute focal neurologic deficits are appreciated.  Skin:    Skin is warm, dry and intact. No rash noted.  No petechiae, purpura, or bullae.  ____________________________________________    LABS (pertinent positives/negatives) (all labs ordered are listed, but only abnormal results are displayed) Labs Reviewed  URINALYSIS, COMPLETE (UACMP) WITH MICROSCOPIC - Abnormal; Notable for the following components:      Result Value   Color, Urine STRAW (*)     APPearance CLEAR (*)    Specific Gravity, Urine 1.031 (*)    Glucose, UA >=500 (*)    Hgb urine dipstick LARGE (*)    Ketones, ur 5 (*)    All other components within normal limits  URINE DRUG SCREEN, QUALITATIVE ([facility] ONLY) - Abnormal; Notable for the following components:   Cocaine Metabolite,Ur [place] POSITIVE (*)    All other components within normal limits  COMPREHENSIVE METABOLIC PANEL - Abnormal; Notable for the following components:   Sodium 131 (*)    Chloride 95 (*)    Glucose, Bld 587 (*)    AST 208 (*)  ALT 99 (*)    All other components within normal limits  CBG MONITORING, ED - Abnormal; Notable for the following components:   Glucose-Capillary 495 (*)    All other components within normal limits  CBG MONITORING, ED - Abnormal; Notable for the following components:   Glucose-Capillary 439 (*)    All other components within normal limits  CBG MONITORING, ED - Abnormal; Notable for the following components:   Glucose-Capillary 230 (*)    All other components within normal limits  ETHANOL  CBC WITH DIFFERENTIAL/PLATELET  LIPASE, BLOOD   ____________________________________________   EKG  Interpreted by me Sinus rhythm rate of 85, normal axis and intervals. Poor R wave progression. Left ventricular hypertrophy. Normal ST segments and T waves, no ischemic changes.  ____________________________________________    RADIOLOGY  CT Head Wo Contrast  Result Date: 08/14/2020 CLINICAL DATA:  Possible overdose, found down, possible trauma EXAM: CT HEAD WITHOUT CONTRAST TECHNIQUE: Contiguous axial images were obtained from the base of the skull through the vertex without intravenous contrast. COMPARISON:  08/12/2020 FINDINGS: Brain: No evidence of acute infarction, hemorrhage, hydrocephalus, extra-axial collection or mass lesion/mass effect. Vascular: No hyperdense vessel or unexpected calcification. Skull: Normal. Negative for fracture or focal lesion. Sinuses/Orbits: No  acute finding. Other: None. IMPRESSION: Negative for bleed or other acute intracranial process. Electronically Signed   By: Corlis Leak M.D.   On: 08/14/2020 08:48   CT ABDOMEN PELVIS W CONTRAST  Result Date: 08/14/2020 CLINICAL DATA:  Nausea, vomiting, abdominal pain, overdose EXAM: CT ABDOMEN AND PELVIS WITH CONTRAST TECHNIQUE: Multidetector CT imaging of the abdomen and pelvis was performed using the standard protocol following bolus administration of intravenous contrast. CONTRAST:  OMNIPAQUE IOHEXOL 300 MG/ML  SOLN COMPARISON:  None. FINDINGS: Lower chest: No acute abnormality. Hepatobiliary: No solid liver abnormality is seen. No gallstones, gallbladder wall thickening, or biliary dilatation. Pancreas: Unremarkable. No pancreatic ductal dilatation or surrounding inflammatory changes. Spleen: Normal in size without significant abnormality. Adrenals/Urinary Tract: Adrenal glands are unremarkable. Multiple small nonobstructive left renal calculi. No right-sided calculi, ureteral calculi, or hydronephrosis. Bladder is unremarkable. Stomach/Bowel: Stomach is within normal limits. Appendix appears normal. No evidence of bowel wall thickening, distention, or inflammatory changes. Vascular/Lymphatic: Aortic atherosclerosis. No enlarged abdominal or pelvic lymph nodes. Reproductive: TURP defect of the prostate. Other: No abdominal wall hernia or abnormality. No abdominopelvic ascites. Musculoskeletal: No acute or significant osseous findings. IMPRESSION: 1. No acute CT findings of the abdomen or pelvis to explain nausea, vomiting, or abdominal pain. 2. Multiple small nonobstructive left renal calculi. No right-sided calculi, ureteral calculi, or hydronephrosis. Aortic Atherosclerosis (ICD10-I70.0). Electronically Signed   By: Lauralyn Primes M.D.   On: 08/14/2020 09:24    ____________________________________________   PROCEDURES Procedures  ____________________________________________  DIFFERENTIAL  DIAGNOSIS   Intracranial hemorrhage, diabetic ketoacidosis, dehydration, electrolyte abnormality, opioid overdose, intoxication, UTI, appendicitis, diverticulitis  CLINICAL IMPRESSION / ASSESSMENT AND PLAN / ED COURSE  Medications ordered in the ED: Medications  sodium chloride 0.9 % bolus 1,000 mL (0 mLs Intravenous Stopped 08/14/20 1003)  ondansetron (ZOFRAN) injection 4 mg (4 mg Intravenous Given 08/14/20 0817)  iohexol (OMNIPAQUE) 300 MG/ML solution 100 mL (100 mLs Intravenous Contrast Given 08/14/20 0907)  insulin aspart (novoLOG) injection 8 Units (8 Units Intravenous Given 08/14/20 1002)  sodium chloride 0.9 % bolus 1,000 mL (0 mLs Intravenous Stopped 08/14/20 1313)    Pertinent labs & imaging results that were available during my care of the patient were reviewed by me and considered in my  medical decision making (see chart for details).   Christopher [name] was evaluated in Emergency Department on 08/14/2020 for the symptoms described in the history of present illness. He was evaluated in the context of the global COVID-19 pandemic, which necessitated consideration that the patient might be at risk for infection with the SARS-CoV-2 virus that causes COVID-19. Institutional protocols and algorithms that pertain to the evaluation of patients at risk for COVID-19 are in a state of rapid change based on information released by regulatory bodies including the CDC and federal and state organizations. These policies and algorithms were followed during the patient's care in the ED.   Patient presents with altered mental status. He was evaluated in the ED 2 days ago for altered mental status as well, urine toxicology was positive for cocaine, and patient improved and was discharged the next day, yesterday. He has very poorly controlled diabetes with an A1c of 14.6.  We'll give IV fluids, check CT head and labs.  ----------------------------------------- 8:36 AM on  08/14/2020 -----------------------------------------  Patient had vomiting. Will give IV Zofran and obtain CT abdomen pelvis.  Clinical Course as of 08/14/20 1404  Sun Aug 14, 2020  7425 Labs show hyperglycemia, slight elevation of transaminases with normal bilirubin. Otherwise normal labs. [PS]  0844 CT head images reviewed by me, no apparent ICH [PS]  0853 CT head radiology report unremarkable. [PS]  0935 CT abdomen pelvis unremarkable.  Will give insulin for glycemic control, p.o. trial. [PS]  1353 Patient is awake alert and oriented.  Tolerating p.o.  He admits to using cocaine 3 or 4 days ago, none since then.  Urinalysis does show some evidence of dehydration, not in DKA.  He has been given fluids and vital signs are normal, stable for discharge.  With patient's permission, case discussed with his girlfriend including reassuring work-up results. [PS]    Clinical Course User Index [PS] Sharman Cheek, MD     ____________________________________________   FINAL CLINICAL IMPRESSION(S) / ED DIAGNOSES    Final diagnoses:  Confusion  Dehydration  Type 2 diabetes mellitus with hyperglycemia, with long-term current use of insulin [facility])     ED Discharge Orders    None      Portions of this note were generated with dragon dictation software. Dictation errors may occur despite best attempts at proofreading.   Sharman Cheek, MD 08/14/20 [phone]

## 2020-08-14 NOTE — ED Notes (Signed)
Pt to CT

## 2020-08-14 NOTE — Discharge Instructions (Signed)
Continue taking your insulin as prescribed.

## 2020-08-14 NOTE — ED Triage Notes (Signed)
Pt arrives from home by EMS for c/o possible overdose/hyperglycemia. Pt was found by his mother wedged between the wall and the bed. On scene pt was agonally breathing. EMS gave 2 mg of Narcan IN and 1 Mg of Narcan IV and started an 18 g in the right AC. EMS gave 500 cc of NS. Pt responded to Narcan and is now alert but confused. On scene pts cbg was 500.

## 2020-08-14 NOTE — ED Notes (Signed)
Lab called to report glucose of 587 on bloodwork. EDP informed. CBG performed at bedside.

## 2020-08-14 NOTE — ED Notes (Signed)
Verbal consent obtained from pt to discuss medical care with his partner, Jackelyn Poling. Gave update over phone to [place]. Pt resting in bed.

## 2020-08-14 NOTE — ED Notes (Signed)
Provided warm blanket. Provided phone for pt to speak with friend Jackelyn Poling.

## 2020-08-14 NOTE — ED Notes (Signed)
Pt alert. Pt stood up to void. Denies dizziness. Back in bed.

## 2020-08-14 NOTE — ED Notes (Signed)
Provided gown and blanket. Pt began vomiting. Vomited around 250 mL. EDP informed.

## 2020-08-14 NOTE — ED Notes (Signed)
Pt sleeping but rouses to speech. Pt very drowsy. IVF infusing and VS wnl. Will send ordered urine specimens with next void.

## 2020-10-18 DEATH — deceased

## 2022-05-15 IMAGING — CT CT HEAD W/O CM
3 series · 16 of 47 positions shown, 19 images · non-contrast
Comparison: 02/16/2018

CLINICAL DATA: Mental status changes of uncertain etiology, found
sitting outside a gas station shaking in itching, not talking,
hyperglycemia, history diabetes mellitus

EXAM:
CT HEAD WITHOUT CONTRAST
TECHNIQUE: Contiguous axial images were obtained from the base of the skull
through the vertex without intravenous contrast. Sagittal and
coronal MPR images reconstructed from axial data set.

[Series 3: head wo · axial · 0.42mm/px · z∈[-111,+14]mm · 10 of 30 slices shown, 13 images]
[im 3/30  brain]
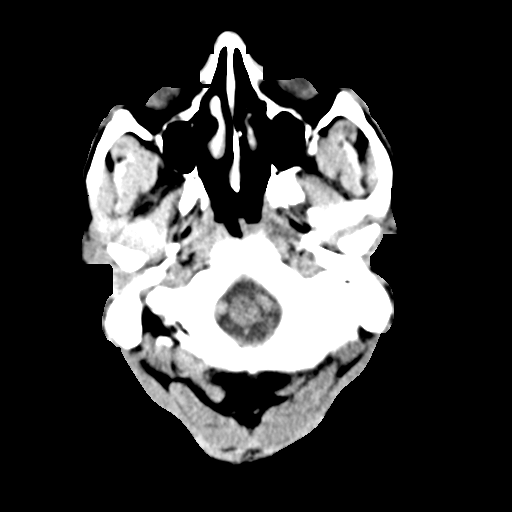
[im 3/30  bone]
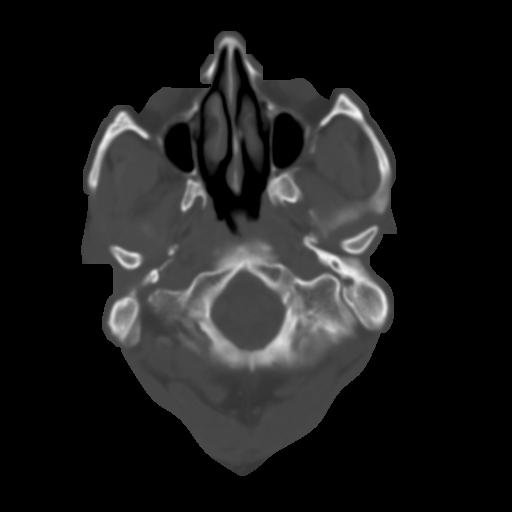
[im 6/30  brain]
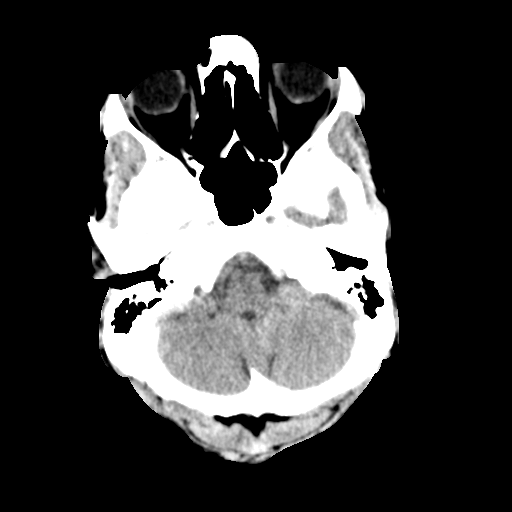
[im 9/30  brain]
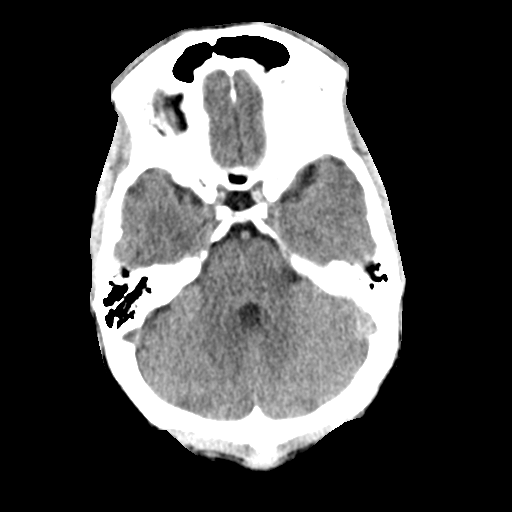
[im 11/30  brain]
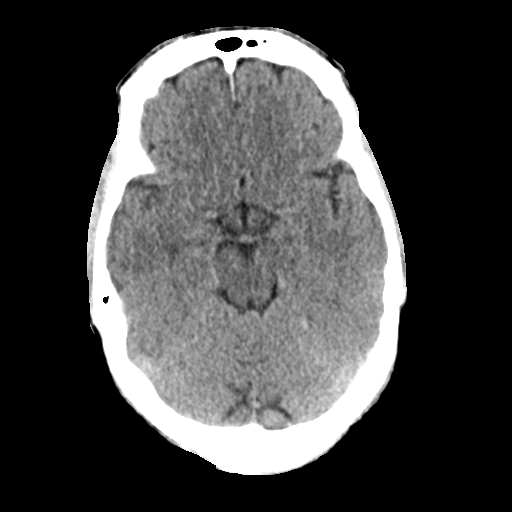
[im 14/30  brain]
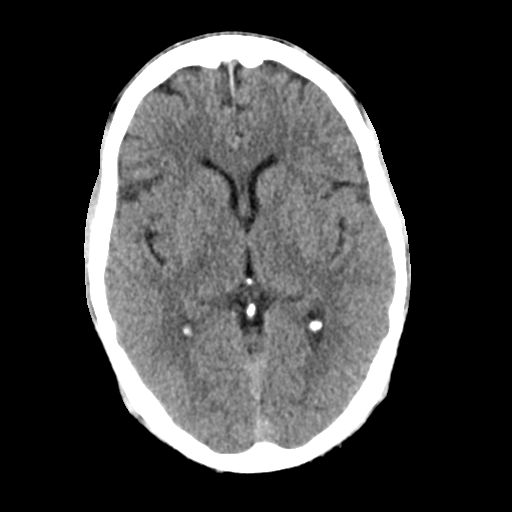
[im 14/30  bone]
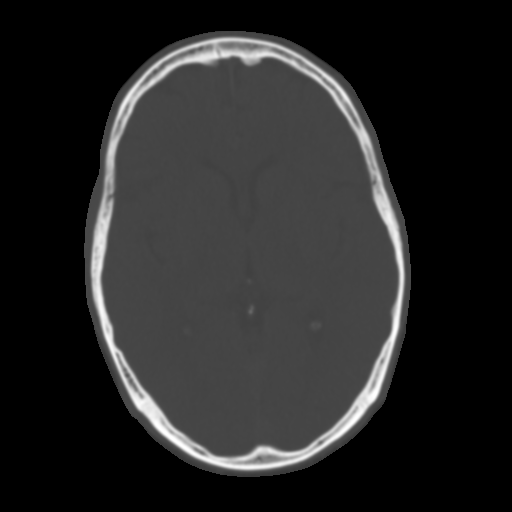
[im 17/30  brain]
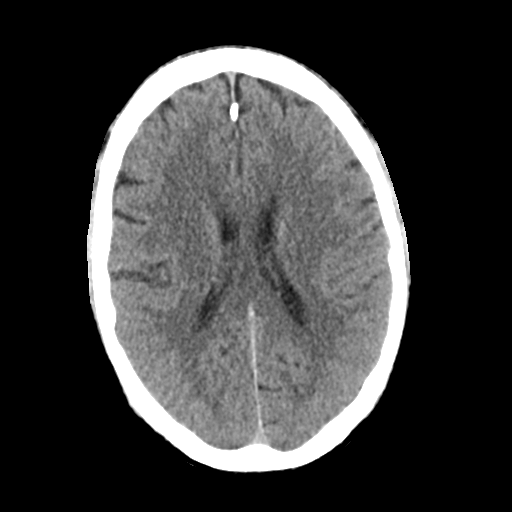
[im 20/30  brain]
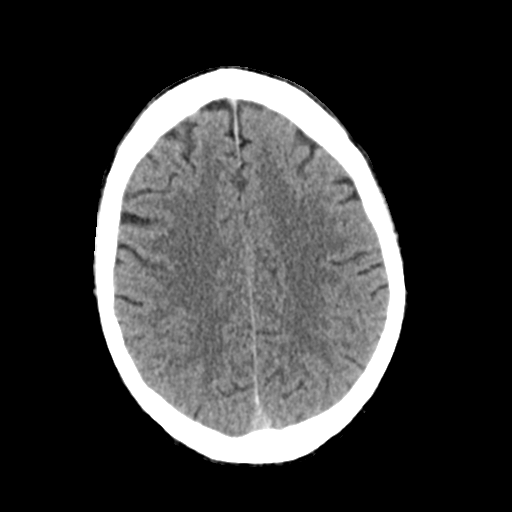
[im 23/30  brain]
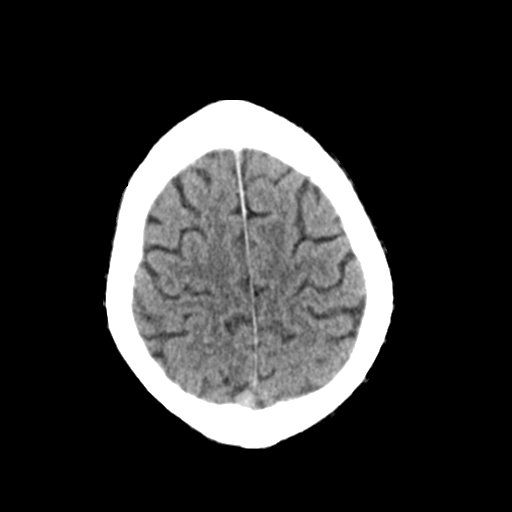
[im 25/30  brain]
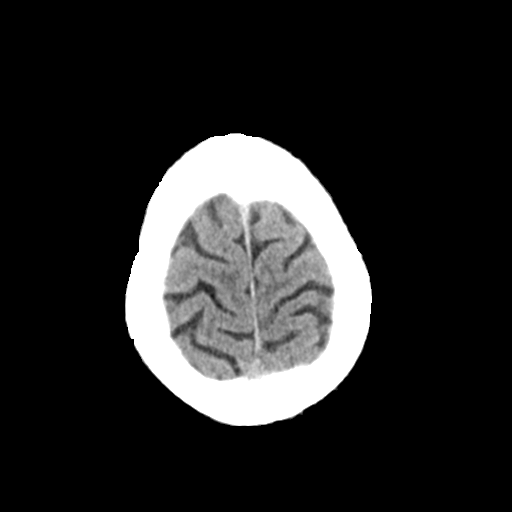
[im 25/30  bone]
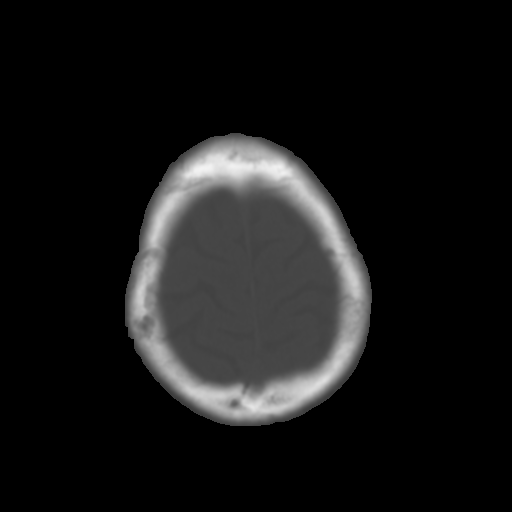
[im 28/30  brain]
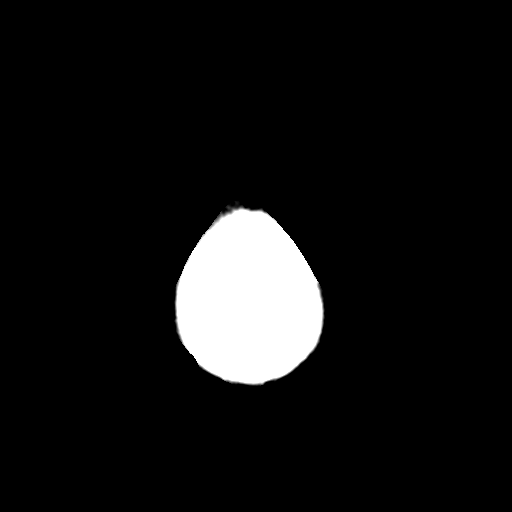

[Series 4: coronal soft tissue · coronal · 0.36mm/px · 3 of 67 slices shown]
[im 23/67  brain]
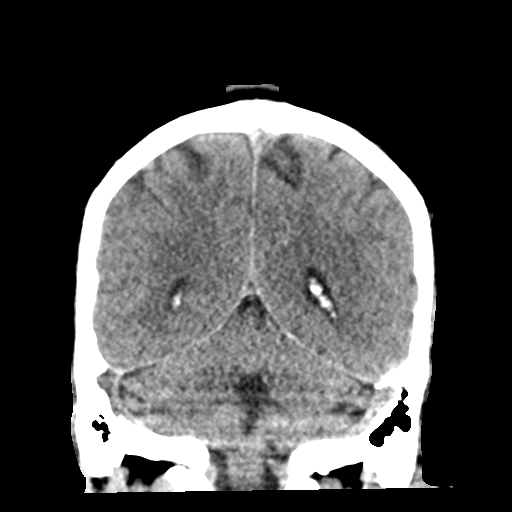
[im 30/67  brain]
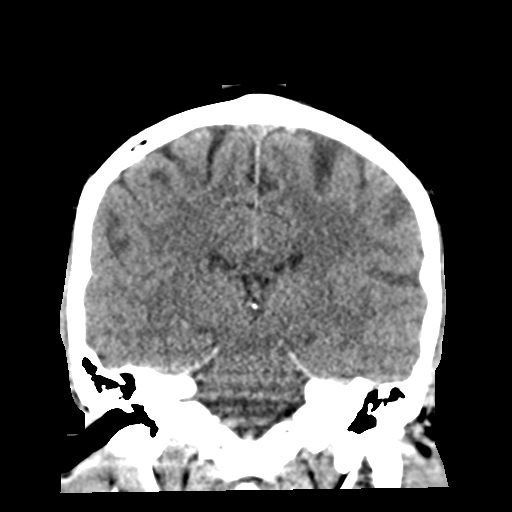
[im 37/67  brain]
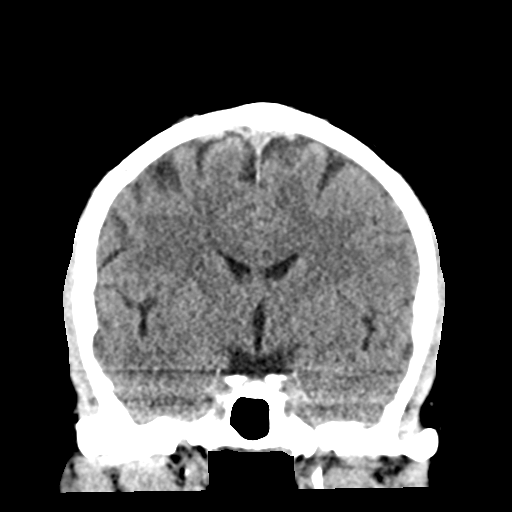

[Series 5: sagittal soft tissue · sagittal · 0.33mm/px · 3 of 67 slices shown]
[im 23/67  brain]
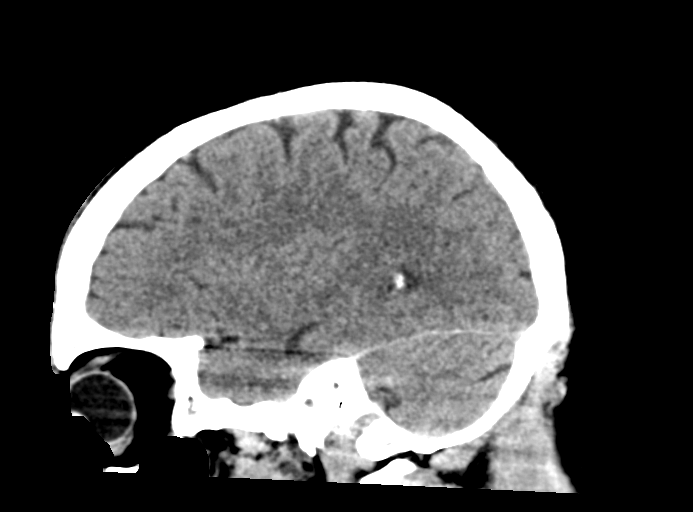
[im 34/67  brain]
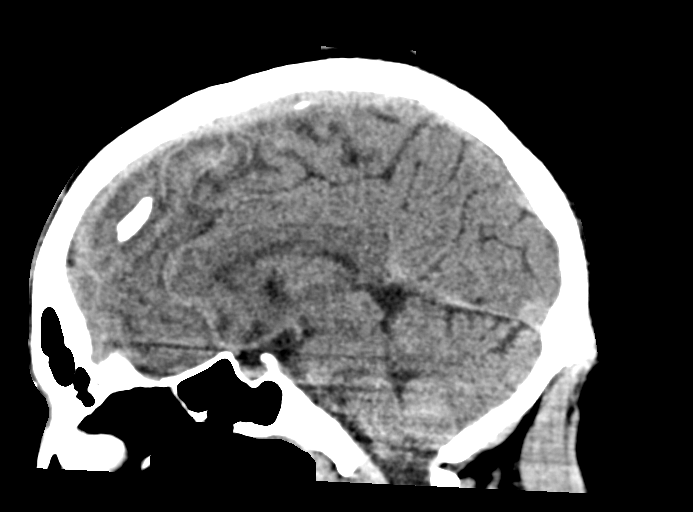
[im 45/67  brain]
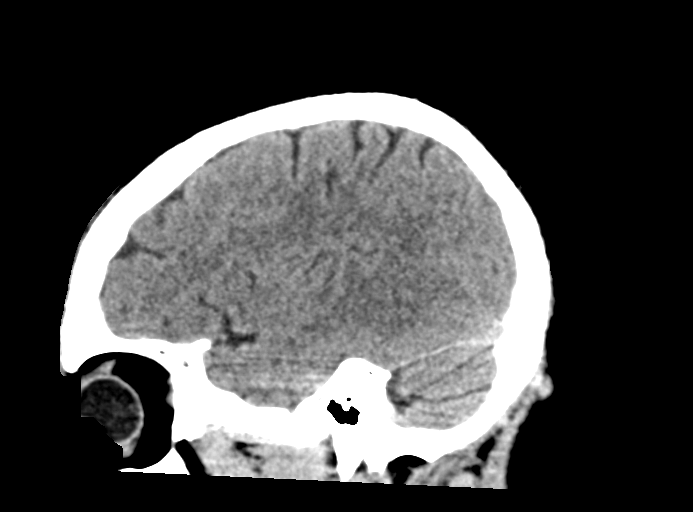

[16 of 47 positions shown; findings below may reference images not displayed]

FINDINGS: Brain: Normal ventricular morphology. No midline shift or mass
effect. Normal appearance of brain parenchyma. No intracranial
hemorrhage, mass lesion, or evidence of acute infarction. No
extra-axial fluid collections.

Vascular: No hyperdense vessels

Skull: Intact

Sinuses/Orbits: Clear

Other: N/A
IMPRESSION: Normal exam.

## 2022-05-17 IMAGING — CT CT ABD-PELV W/ CM
2 of 5 series · 16 of 46 positions shown, 18 images · IV contrast (omnipaque)
Comparison: None.

CLINICAL DATA: Nausea, vomiting, abdominal pain, overdose

EXAM:
CT ABDOMEN AND PELVIS WITH CONTRAST
TECHNIQUE: Multidetector CT imaging of the abdomen and pelvis was performed
using the standard protocol following bolus administration of
intravenous contrast.
CONTRAST:  100mL OMNIPAQUE IOHEXOL 300 MG/ML  SOLN

[Series 2: routine abd/pel with · axial · 0.68mm/px · z∈[-1032,-632]mm · 13 of 92 slices shown, 15 images]
[im 6/92  soft-tissue]
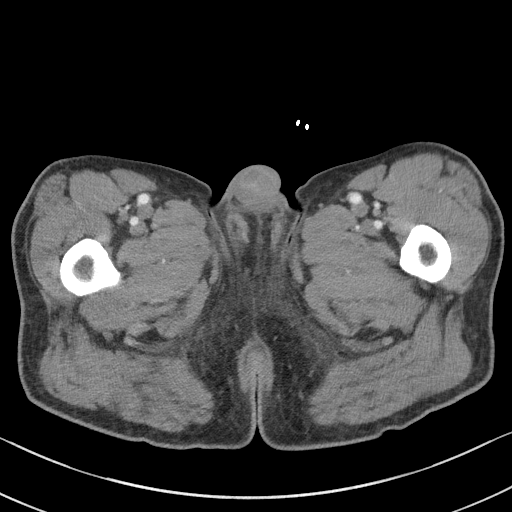
[im 6/92  bone]
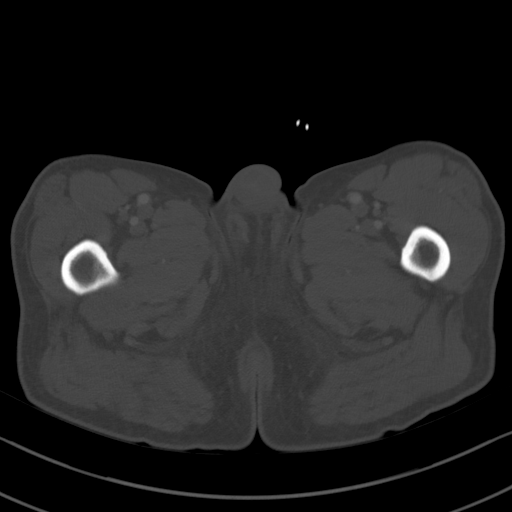
[im 11/92  soft-tissue]
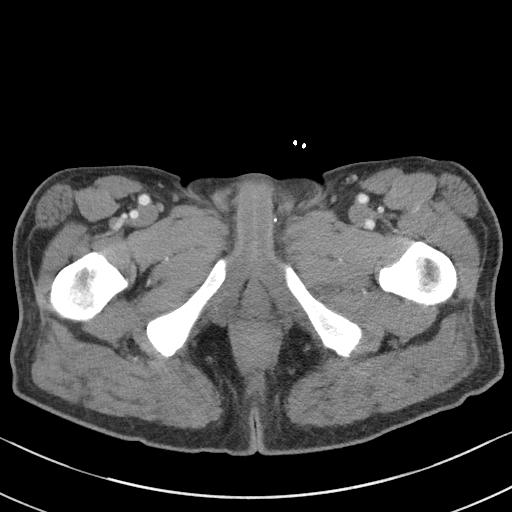
[im 21/92  soft-tissue]
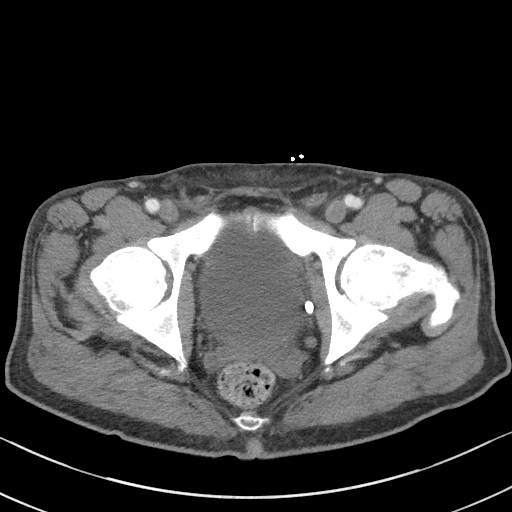
[im 26/92  soft-tissue]
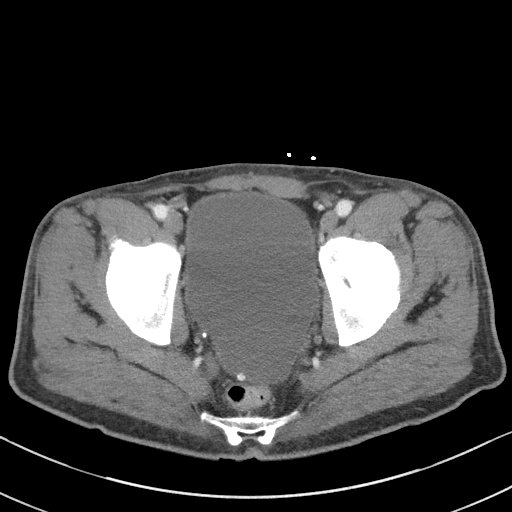
[im 31/92  soft-tissue]
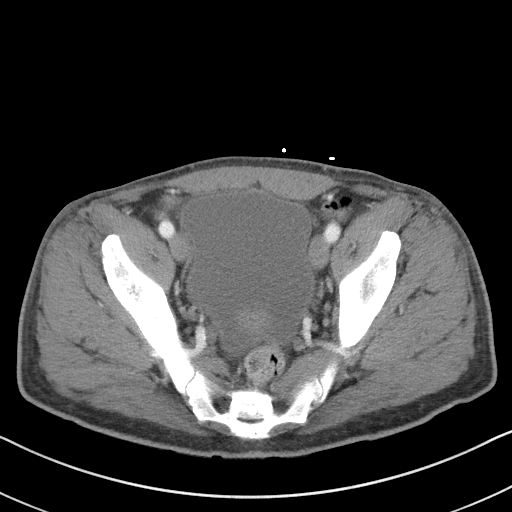
[im 41/92  soft-tissue]
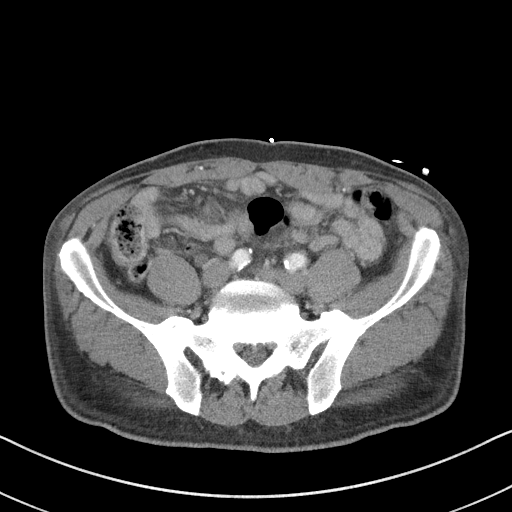
[im 46/92  soft-tissue]
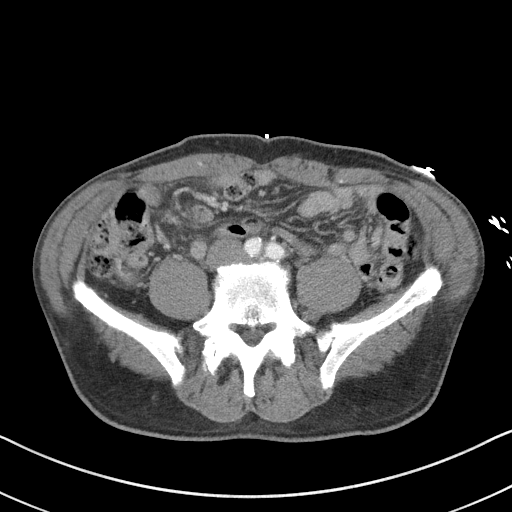
[im 51/92  soft-tissue]
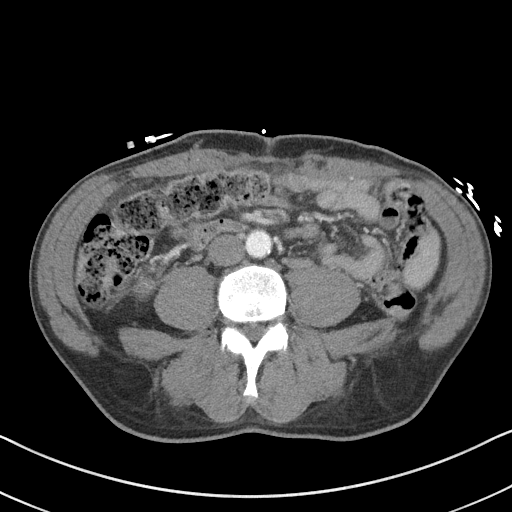
[im 61/92  soft-tissue]
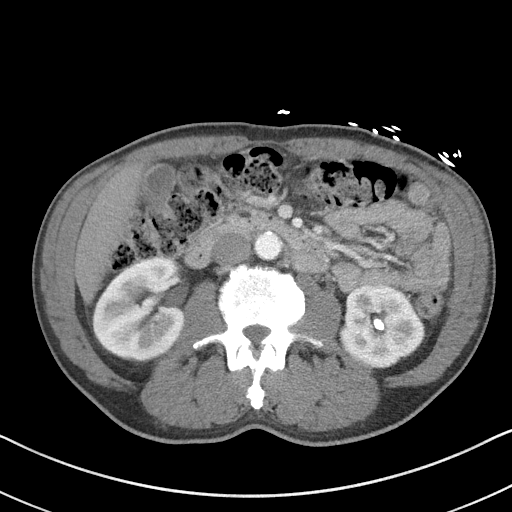
[im 61/92  bone]
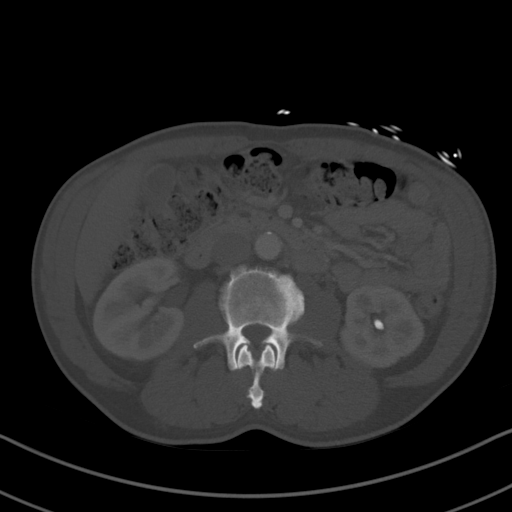
[im 66/92  soft-tissue]
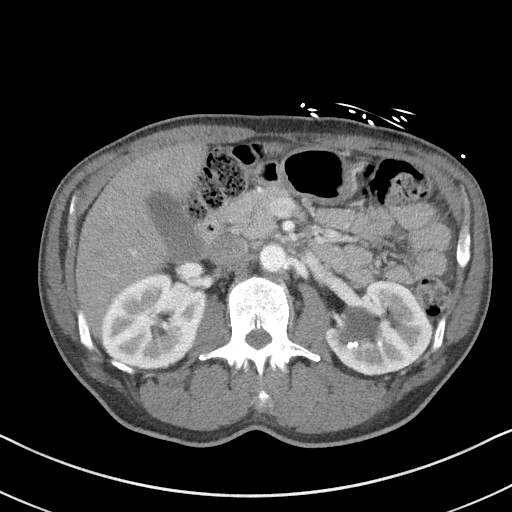
[im 71/92  soft-tissue]
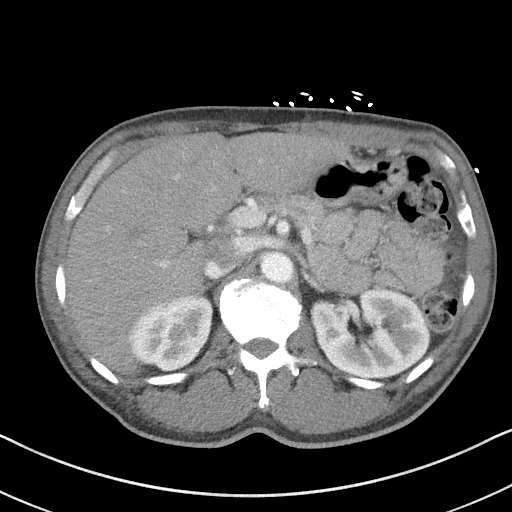
[im 81/92  soft-tissue]
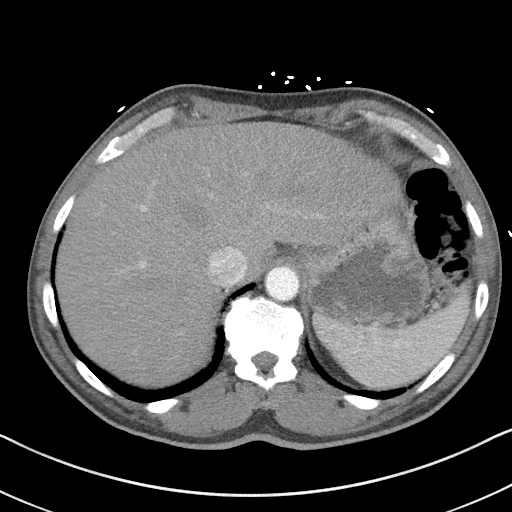
[im 86/92  soft-tissue]
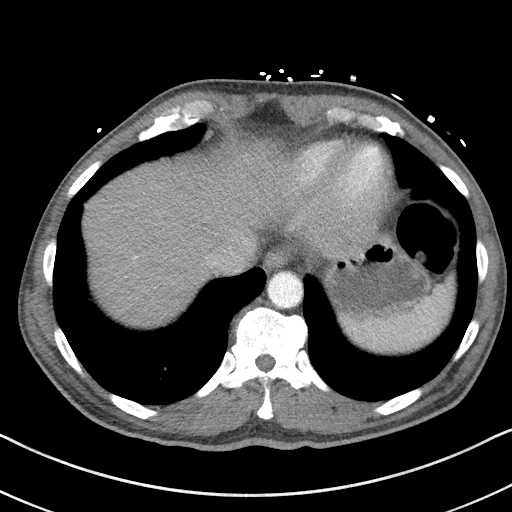

[Series 5: coronal st · coronal · 0.80mm/px · 3 of 86 slices shown]
[im 29/86  soft-tissue]
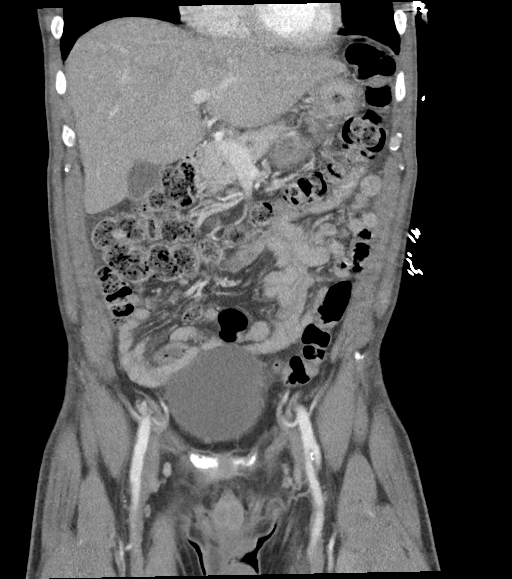
[im 38/86  soft-tissue]
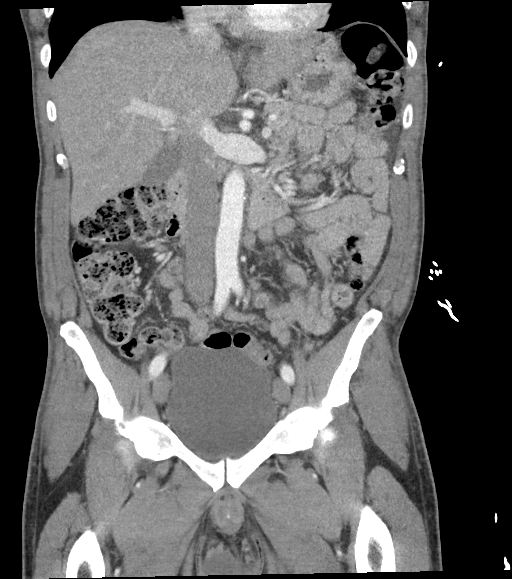
[im 48/86  soft-tissue]
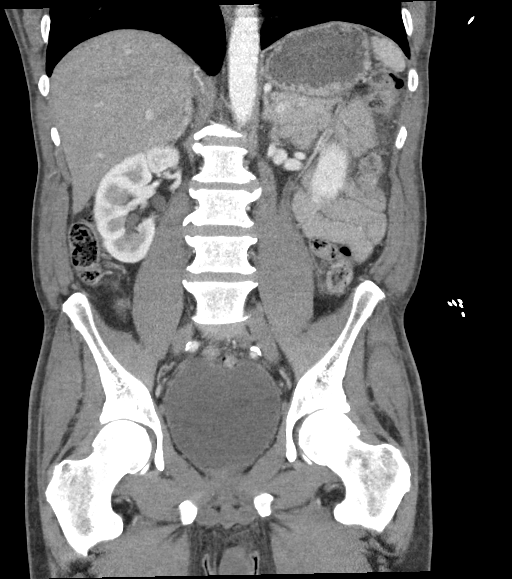

[16 of 46 positions shown; findings below may reference images not displayed]

FINDINGS: Lower chest: No acute abnormality.

Hepatobiliary: No solid liver abnormality is seen. No gallstones,
gallbladder wall thickening, or biliary dilatation.

Pancreas: Unremarkable. No pancreatic ductal dilatation or
surrounding inflammatory changes.

Spleen: Normal in size without significant abnormality.

Adrenals/Urinary Tract: Adrenal glands are unremarkable. Multiple
small nonobstructive left renal calculi. No right-sided calculi,
ureteral calculi, or hydronephrosis. Bladder is unremarkable.

Stomach/Bowel: Stomach is within normal limits. Appendix appears
normal. No evidence of bowel wall thickening, distention, or
inflammatory changes.

Vascular/Lymphatic: Aortic atherosclerosis. No enlarged abdominal or
pelvic lymph nodes.

Reproductive: TURP defect of the prostate.

Other: No abdominal wall hernia or abnormality. No abdominopelvic
ascites.

Musculoskeletal: No acute or significant osseous findings.
IMPRESSION: 1. No acute CT findings of the abdomen or pelvis to explain nausea,
vomiting, or abdominal pain.
2. Multiple small nonobstructive left renal calculi. No right-sided
calculi, ureteral calculi, or hydronephrosis.

Aortic Atherosclerosis ([id number]).
